# Patient Record
Sex: Male | Born: 1945
Health system: Southern US, Community
[De-identification: ages and names within clinical notes are randomized; demographics above are authoritative.]

## PROBLEM LIST (undated history)

## (undated) DIAGNOSIS — M109 Gout, unspecified: Secondary | ICD-10-CM

## (undated) DIAGNOSIS — I739 Peripheral vascular disease, unspecified: Secondary | ICD-10-CM

## (undated) DIAGNOSIS — K219 Gastro-esophageal reflux disease without esophagitis: Secondary | ICD-10-CM

## (undated) DIAGNOSIS — E785 Hyperlipidemia, unspecified: Secondary | ICD-10-CM

## (undated) DIAGNOSIS — R011 Cardiac murmur, unspecified: Secondary | ICD-10-CM

## (undated) DIAGNOSIS — G43909 Migraine, unspecified, not intractable, without status migrainosus: Secondary | ICD-10-CM

## (undated) DIAGNOSIS — R42 Dizziness and giddiness: Secondary | ICD-10-CM

## (undated) DIAGNOSIS — I2510 Atherosclerotic heart disease of native coronary artery without angina pectoris: Secondary | ICD-10-CM

## (undated) DIAGNOSIS — I10 Essential (primary) hypertension: Secondary | ICD-10-CM

## (undated) DIAGNOSIS — H409 Unspecified glaucoma: Secondary | ICD-10-CM

## (undated) DIAGNOSIS — R0602 Shortness of breath: Secondary | ICD-10-CM

## (undated) DIAGNOSIS — M1990 Unspecified osteoarthritis, unspecified site: Secondary | ICD-10-CM

## (undated) DIAGNOSIS — D509 Iron deficiency anemia, unspecified: Secondary | ICD-10-CM

## (undated) DIAGNOSIS — N400 Enlarged prostate without lower urinary tract symptoms: Secondary | ICD-10-CM

## (undated) HISTORY — PX: INCISION AND DRAINAGE: SHX5863

## (undated) HISTORY — DX: Gout, unspecified: M10.9

---

## 2000-12-10 ENCOUNTER — Encounter: Payer: Self-pay | Admitting: Family Medicine

## 2000-12-10 ENCOUNTER — Ambulatory Visit (HOSPITAL_COMMUNITY): Admission: RE | Admit: 2000-12-10 | Discharge: 2000-12-10 | Payer: Self-pay | Admitting: Family Medicine

## 2001-12-03 ENCOUNTER — Emergency Department (HOSPITAL_COMMUNITY): Admission: EM | Admit: 2001-12-03 | Discharge: 2001-12-03 | Payer: Self-pay | Admitting: Emergency Medicine

## 2001-12-03 ENCOUNTER — Encounter: Payer: Self-pay | Admitting: Emergency Medicine

## 2003-01-02 ENCOUNTER — Encounter: Admission: RE | Admit: 2003-01-02 | Discharge: 2003-01-02 | Payer: Self-pay | Admitting: Family Medicine

## 2006-05-04 ENCOUNTER — Encounter: Admission: RE | Admit: 2006-05-04 | Discharge: 2006-05-04 | Payer: Self-pay | Admitting: Sports Medicine

## 2006-05-08 ENCOUNTER — Encounter: Admission: RE | Admit: 2006-05-08 | Discharge: 2006-05-08 | Payer: Self-pay | Admitting: Sports Medicine

## 2008-11-27 ENCOUNTER — Inpatient Hospital Stay (HOSPITAL_COMMUNITY): Admission: AD | Admit: 2008-11-27 | Discharge: 2008-11-28 | Payer: Self-pay | Admitting: Interventional Cardiology

## 2008-11-27 HISTORY — PX: CORONARY ANGIOPLASTY WITH STENT PLACEMENT: SHX49

## 2009-05-05 ENCOUNTER — Emergency Department (HOSPITAL_COMMUNITY): Admission: EM | Admit: 2009-05-05 | Discharge: 2009-05-06 | Payer: Self-pay | Admitting: Emergency Medicine

## 2010-02-17 ENCOUNTER — Encounter: Payer: Self-pay | Admitting: Orthopedic Surgery

## 2010-05-01 LAB — BASIC METABOLIC PANEL
BUN: 9 mg/dL (ref 6–23)
CO2: 28 mEq/L (ref 19–32)
Calcium: 9 mg/dL (ref 8.4–10.5)
Chloride: 106 mEq/L (ref 96–112)
Creatinine, Ser: 1.44 mg/dL (ref 0.4–1.5)
GFR calc Af Amer: >60 mL/min (ref >60)
GFR calc non Af Amer: 50 mL/min — ABNORMAL LOW (ref >60)
Glucose, Bld: 106 mg/dL — ABNORMAL HIGH (ref 70–99)
Potassium: 3.8 mEq/L (ref 3.5–5.1)
Sodium: 142 mEq/L (ref 135–145)

## 2010-05-01 LAB — CBC
HCT: 37.6 % — ABNORMAL LOW (ref 39.0–52.0)
Hemoglobin: 13.1 g/dL (ref 13.0–17.0)
MCHC: 34.8 g/dL (ref 30.0–36.0)
MCV: 86.6 fL (ref 78.0–100.0)
Platelets: 192 10*3/uL (ref 150–400)
RBC: 4.34 MIL/uL (ref 4.22–5.81)
RDW: 14.9 % (ref 11.5–15.5)
WBC: 6.6 10*3/uL (ref 4.0–10.5)

## 2011-02-03 ENCOUNTER — Ambulatory Visit: Payer: Self-pay

## 2011-02-03 DIAGNOSIS — Z23 Encounter for immunization: Secondary | ICD-10-CM

## 2011-12-22 ENCOUNTER — Encounter (HOSPITAL_COMMUNITY): Payer: Self-pay | Admitting: *Deleted

## 2011-12-23 ENCOUNTER — Encounter (HOSPITAL_COMMUNITY): Payer: Self-pay | Admitting: Pharmacy Technician

## 2011-12-23 ENCOUNTER — Encounter (HOSPITAL_COMMUNITY): Payer: Self-pay | Admitting: *Deleted

## 2011-12-23 NOTE — Progress Notes (Signed)
PT'S LAST OFFICE VISIT AT [facility] WAS 12/11/11--THE NOTE IS ON PT'S CHART WITH EKG ALSO DONE 12/11/11.

## 2011-12-23 NOTE — Progress Notes (Signed)
[facility] FAXED PT'S CBC WITH DIFF REPORT-DONE 12/15/11 ALONG WITH OFFICE NOTE--PLACED ON PT'S CHART.

## 2011-12-24 ENCOUNTER — Encounter (HOSPITAL_COMMUNITY)
Admission: RE | Disposition: A | Discharge status: Home / Self Care | Payer: Self-pay | Source: Ambulatory Visit | Attending: Gastroenterology

## 2011-12-24 ENCOUNTER — Ambulatory Visit (HOSPITAL_COMMUNITY): Payer: No Typology Code available for payment source | Admitting: Anesthesiology

## 2011-12-24 ENCOUNTER — Ambulatory Visit (HOSPITAL_COMMUNITY)
Admission: RE | Admit: 2011-12-24 | Discharge: 2011-12-24 | Disposition: A | Discharge status: Home / Self Care | Payer: No Typology Code available for payment source | Source: Ambulatory Visit | Attending: Gastroenterology | Admitting: Gastroenterology

## 2011-12-24 ENCOUNTER — Encounter (HOSPITAL_COMMUNITY): Payer: Self-pay | Admitting: Anesthesiology

## 2011-12-24 ENCOUNTER — Encounter (HOSPITAL_COMMUNITY): Payer: Self-pay | Admitting: *Deleted

## 2011-12-24 ENCOUNTER — Other Ambulatory Visit: Payer: Self-pay | Admitting: Gastroenterology

## 2011-12-24 DIAGNOSIS — I2510 Atherosclerotic heart disease of native coronary artery without angina pectoris: Secondary | ICD-10-CM | POA: Insufficient documentation

## 2011-12-24 DIAGNOSIS — K219 Gastro-esophageal reflux disease without esophagitis: Secondary | ICD-10-CM | POA: Insufficient documentation

## 2011-12-24 DIAGNOSIS — I10 Essential (primary) hypertension: Secondary | ICD-10-CM | POA: Insufficient documentation

## 2011-12-24 DIAGNOSIS — K319 Disease of stomach and duodenum, unspecified: Secondary | ICD-10-CM | POA: Insufficient documentation

## 2011-12-24 DIAGNOSIS — D509 Iron deficiency anemia, unspecified: Secondary | ICD-10-CM | POA: Insufficient documentation

## 2011-12-24 HISTORY — DX: Benign prostatic hyperplasia without lower urinary tract symptoms: N40.0

## 2011-12-24 HISTORY — DX: Essential (primary) hypertension: I10

## 2011-12-24 HISTORY — DX: Peripheral vascular disease, unspecified: I73.9

## 2011-12-24 HISTORY — DX: Hyperlipidemia, unspecified: E78.5

## 2011-12-24 HISTORY — DX: Gastro-esophageal reflux disease without esophagitis: K21.9

## 2011-12-24 HISTORY — PX: EUS: SHX5427

## 2011-12-24 HISTORY — DX: Cardiac murmur, unspecified: R01.1

## 2011-12-24 HISTORY — DX: Unspecified glaucoma: H40.9

## 2011-12-24 HISTORY — DX: Atherosclerotic heart disease of native coronary artery without angina pectoris: I25.10

## 2011-12-24 HISTORY — DX: Dizziness and giddiness: R42

## 2011-12-24 SURGERY — ULTRASOUND, UPPER GI TRACT, ENDOSCOPIC
Anesthesia: General

## 2011-12-24 MED ORDER — LACTATED RINGERS IV SOLN
INTRAVENOUS | Status: DC | PRN
  Administered 2011-12-24: 10:00:00 via INTRAVENOUS

## 2011-12-24 MED ORDER — BUTAMBEN-TETRACAINE-BENZOCAINE 2-2-14 % EX AERO
INHALATION_SPRAY | CUTANEOUS | Status: DC | PRN
  Administered 2011-12-24: 2 via TOPICAL

## 2011-12-24 MED ORDER — KETAMINE HCL 10 MG/ML IJ SOLN
INTRAMUSCULAR | Status: DC | PRN
  Administered 2011-12-24 (×2): 5 mg via INTRAVENOUS

## 2011-12-24 MED ORDER — ONDANSETRON HCL 4 MG/2ML IJ SOLN
INTRAMUSCULAR | Status: DC | PRN
  Administered 2011-12-24 (×2): 2 mg via INTRAVENOUS

## 2011-12-24 MED ORDER — LACTATED RINGERS IV SOLN
INTRAVENOUS | Status: DC
  Administered 2011-12-24: 09:00:00 via INTRAVENOUS

## 2011-12-24 MED ORDER — PROPOFOL 10 MG/ML IV EMUL
INTRAVENOUS | Status: DC | PRN
  Administered 2011-12-24: 125 ug/kg/min via INTRAVENOUS

## 2011-12-24 MED ORDER — FENTANYL CITRATE 0.05 MG/ML IJ SOLN
INTRAMUSCULAR | Status: DC | PRN
  Administered 2011-12-24 (×2): 50 ug via INTRAVENOUS

## 2011-12-24 MED ORDER — MIDAZOLAM HCL 5 MG/5ML IJ SOLN
INTRAMUSCULAR | Status: DC | PRN
  Administered 2011-12-24: 1 mg via INTRAVENOUS

## 2011-12-24 MED ORDER — SODIUM CHLORIDE 0.9 % IV SOLN: INTRAVENOUS | Status: DC

## 2011-12-24 NOTE — Op Note (Signed)
[facility] [address] [place], [address]   ENDOSCOPIC ULTRASOUND PROCEDURE REPORT  PATIENT: Robert Estes, Robert Estes  MR#: [id number] BIRTHDATE: May 29, 1945  GENDER: Male ENDOSCOPIST: Willis Modena, MD REFERRED BY:  Bernette Redbird, M.D.  Billee Cashing, M.D. PROCEDURE DATE:  12/24/2011 PROCEDURE:   Upper EUS, Endoscopy with biopsies. ASA CLASS:      Class III INDICATIONS:   1.  microcytic anemia, gastric lesion. MEDICATIONS: Cetacaine spray x 2 and MAC sedation, administered by CRNA  DESCRIPTION OF PROCEDURE:   After the risks benefits and alternatives of the procedure were  explained, informed consent was obtained. The patient was then placed in the left, lateral, decubitus postion and IV sedation was administered. Throughout the procedure, the patients blood pressure, pulse and oxygen saturations were monitored continuously.  Under direct visualization, the EUS scope [id number]  endoscope was introduced through the mouth  and advanced to the bulb of duodenum .  Water was used as necessary to provide an acoustic interface.  Upon completion of the imaging, water was removed and the patient was sent to the recovery room in satisfactory condition.    FINDINGS:      Beefy red lesion, about 2 x 2 cm in size, soft but friable with some mild oozing upon contact, was visible endoscopically in the proximal body of stomach along greater curvature.  EUS then was performed.  Stomach was gently instilled with water to facilitate acoustic coupling.  The lesion was about 22 x 17mm in size on EUS, appeared to be mucosally based, and there was no clear involvement with the muscularis propria.  Wall layers of stomach appeared otherwise intact.  There was no perigastric adenopathy.  Incidental notation of gallstones.  At conclusion of our exam, after suctioning the majority of the instilled water back through the endoscope, mucosal biopsies were taken of the lesion with pediatric  cold biopsy forceps.  IMPRESSION:     As above.  Friable gatric lesion could certainly lead to microcytic anemia.  Appearance most typical of inflamed hyperplastic tissue, biopsies obtained.  RECOMMENDATIONS:     1.  Watch for potential complications of procedure. 2.  Await biopsy results. 3.  Avoid ASA/NSAIDs.  Would be reluctant to restart clopidigrel at this time as well, unless patient is at high risk for cardiac events, as this lesion will likely bleed with intensive antiplatelet therapy. 4.  Regardless of what this lesion is, it likely needs to be removed (surgically or perhaps even endoscopically).  Routine endoscopic and endoultrasonographic appearance is most suggestive of benign process, but this can't be definitively proven with biopsies alone. 5.  Will discuss with Dr. Matthias Hughs.   _______________________________ eSigned:  Willis Modena, MD 12/24/2011 10:50 AM   CC:

## 2011-12-24 NOTE — H&P (Signed)
Patient interval history reviewed.  Patient examined again.  There has been no change from documented H/P dated 12/16/11 (scanned into chart from our office) except as documented above.  Assessment:  1. Iron-deficiency anemia. 2.  Gastric mass, mucosal biopsies benign.  Plan:  1.  Endoscopy with ultrasound and possible biopsies +/- fine needle aspiration. 2.  Risks (bleeding, infection, bowel perforation that could require surgery, sedation-related changes in cardiopulmonary systems), benefits (identification and possible treatment of source of symptoms, exclusion of certain causes of symptoms), and alternatives (watchful waiting, radiographic imaging studies, empiric medical treatment) of upper endoscopy with ultrasound and biopsies +/- fine needle aspiration (EGD +/- biopsies +/- FNA) were explained to patient/son in detail and patient wishes to proceed.

## 2011-12-24 NOTE — Anesthesia Preprocedure Evaluation (Addendum)
Anesthesia Evaluation  Patient identified by MRN, date of birth, ID band Patient awake    Reviewed: Allergy & Precautions, H&P , NPO status , Patient's Chart, lab work & pertinent test results  Airway Mallampati: II TM Distance: >3 FB Neck ROM: Full    Dental  (+) Teeth Intact and Caps,    Pulmonary shortness of breath,  breath sounds clear to auscultation  Pulmonary exam normal       Cardiovascular hypertension, Pt. on medications and Pt. on home beta blockers + CAD, + Cardiac Stents and + Peripheral Vascular Disease + Valvular Problems/Murmurs AS Rhythm:Regular Rate:Normal + Systolic murmurs    Neuro/Psych negative neurological ROS  negative psych ROS   GI/Hepatic Neg liver ROS, GERD-  ,  Endo/Other  negative endocrine ROS  Renal/GU negative Renal ROS  negative genitourinary   Musculoskeletal negative musculoskeletal ROS (+)   Abdominal Normal abdominal exam  (+)   Peds  Hematology negative hematology ROS (+)   Anesthesia Other Findings   Reproductive/Obstetrics negative OB ROS                        Anesthesia Physical Anesthesia Plan  ASA: III  Anesthesia Plan: General   Post-op Pain Management:    Induction: Intravenous  Airway Management Planned: Nasal Cannula  Additional Equipment:   Intra-op Plan:   Post-operative Plan: Extubation in OR  Informed Consent:   Plan Discussed with: CRNA  Anesthesia Plan Comments:         Anesthesia Quick Evaluation

## 2011-12-24 NOTE — Anesthesia Postprocedure Evaluation (Signed)
Anesthesia Post Note  Patient: Robert Estes  Procedure(s) Performed: Procedure(s) (LRB): FULL UPPER ENDOSCOPIC ULTRASOUND (EUS) RADIAL (N/A)  Anesthesia type: General  Patient location: PACU  Post pain: Pain level controlled  Post assessment: Post-op Vital signs reviewed  Last Vitals:  Filed Vitals:   12/24/11 1049  BP: 130/70  Pulse:   Temp:   Resp: 15    Post vital signs: Reviewed  Level of consciousness: sedated  Complications: No apparent anesthesia complications

## 2011-12-24 NOTE — Transfer of Care (Signed)
Immediate Anesthesia Transfer of Care Note  Patient: Robert Estes  Procedure(s) Performed: Procedure(s) (LRB) with comments: FULL UPPER ENDOSCOPIC ULTRASOUND (EUS) RADIAL (N/A)  Patient Location: PACU  Anesthesia Type:MAC  Level of Consciousness: awake, alert , oriented and patient cooperative  Airway & Oxygen Therapy: Patient Spontanous Breathing and Patient connected to nasal cannula oxygen  Post-op Assessment: Report given to PACU RN and Post -op Vital signs reviewed and stable  Post vital signs: Reviewed and stable  Complications: No apparent anesthesia complications

## 2011-12-29 ENCOUNTER — Encounter (HOSPITAL_COMMUNITY): Payer: Self-pay | Admitting: Gastroenterology

## 2012-02-04 ENCOUNTER — Encounter (HOSPITAL_COMMUNITY): Payer: Self-pay

## 2012-02-04 ENCOUNTER — Emergency Department (HOSPITAL_COMMUNITY)
Admission: EM | Admit: 2012-02-04 | Discharge: 2012-02-05 | Disposition: A | Discharge status: Home / Self Care | Payer: No Typology Code available for payment source | Attending: Emergency Medicine | Admitting: Emergency Medicine

## 2012-02-04 DIAGNOSIS — N400 Enlarged prostate without lower urinary tract symptoms: Secondary | ICD-10-CM | POA: Insufficient documentation

## 2012-02-04 DIAGNOSIS — R011 Cardiac murmur, unspecified: Secondary | ICD-10-CM | POA: Insufficient documentation

## 2012-02-04 DIAGNOSIS — R1013 Epigastric pain: Secondary | ICD-10-CM | POA: Insufficient documentation

## 2012-02-04 DIAGNOSIS — K219 Gastro-esophageal reflux disease without esophagitis: Secondary | ICD-10-CM | POA: Insufficient documentation

## 2012-02-04 DIAGNOSIS — Z79899 Other long term (current) drug therapy: Secondary | ICD-10-CM | POA: Insufficient documentation

## 2012-02-04 DIAGNOSIS — R109 Unspecified abdominal pain: Secondary | ICD-10-CM

## 2012-02-04 DIAGNOSIS — H409 Unspecified glaucoma: Secondary | ICD-10-CM | POA: Insufficient documentation

## 2012-02-04 DIAGNOSIS — E785 Hyperlipidemia, unspecified: Secondary | ICD-10-CM | POA: Insufficient documentation

## 2012-02-04 DIAGNOSIS — Z9889 Other specified postprocedural states: Secondary | ICD-10-CM | POA: Insufficient documentation

## 2012-02-04 DIAGNOSIS — R630 Anorexia: Secondary | ICD-10-CM | POA: Insufficient documentation

## 2012-02-04 DIAGNOSIS — K2970 Gastritis, unspecified, without bleeding: Secondary | ICD-10-CM | POA: Insufficient documentation

## 2012-02-04 DIAGNOSIS — K8020 Calculus of gallbladder without cholecystitis without obstruction: Secondary | ICD-10-CM | POA: Insufficient documentation

## 2012-02-04 DIAGNOSIS — I739 Peripheral vascular disease, unspecified: Secondary | ICD-10-CM | POA: Insufficient documentation

## 2012-02-04 DIAGNOSIS — Z7982 Long term (current) use of aspirin: Secondary | ICD-10-CM | POA: Insufficient documentation

## 2012-02-04 DIAGNOSIS — D649 Anemia, unspecified: Secondary | ICD-10-CM | POA: Insufficient documentation

## 2012-02-04 DIAGNOSIS — I10 Essential (primary) hypertension: Secondary | ICD-10-CM | POA: Insufficient documentation

## 2012-02-04 NOTE — ED Notes (Signed)
Pt with severe sudden onset abdominal pain today.  Hx gastric lesions in November although pt states he did not have pain with that dx.  Has tried enema, gas x at home with no relief.  Pt states feels like his stomach is "going to bust".

## 2012-02-05 ENCOUNTER — Emergency Department (HOSPITAL_COMMUNITY): Payer: No Typology Code available for payment source

## 2012-02-05 LAB — COMPREHENSIVE METABOLIC PANEL
ALT: 19 U/L (ref 0–53)
AST: 26 U/L (ref 0–37)
Albumin: 3.7 g/dL (ref 3.5–5.2)
Alkaline Phosphatase: 73 U/L (ref 39–117)
BUN: 17 mg/dL (ref 6–23)
CO2: 23 mEq/L (ref 19–32)
Calcium: 10 mg/dL (ref 8.4–10.5)
Chloride: 102 mEq/L (ref 96–112)
Creatinine, Ser: 1.23 mg/dL (ref 0.50–1.35)
GFR calc Af Amer: 70 mL/min — ABNORMAL LOW (ref >90)
GFR calc non Af Amer: 60 mL/min — ABNORMAL LOW (ref >90)
Glucose, Bld: 97 mg/dL (ref 70–99)
Potassium: 3.5 mEq/L (ref 3.5–5.1)
Sodium: 137 mEq/L (ref 135–145)
Total Bilirubin: 0.2 mg/dL — ABNORMAL LOW (ref 0.3–1.2)
Total Protein: 6.7 g/dL (ref 6.0–8.3)

## 2012-02-05 LAB — CBC WITH DIFFERENTIAL/PLATELET
Basophils Absolute: 0 10*3/uL (ref 0.0–0.1)
Basophils Relative: 0 % (ref 0–1)
Eosinophils Absolute: 0 10*3/uL (ref 0.0–0.7)
Eosinophils Relative: 0 % (ref 0–5)
HCT: 33.4 % — ABNORMAL LOW (ref 39.0–52.0)
Hemoglobin: 10.6 g/dL — ABNORMAL LOW (ref 13.0–17.0)
Lymphocytes Relative: 44 % (ref 12–46)
Lymphs Abs: 1.9 10*3/uL (ref 0.7–4.0)
MCH: 23.2 pg — ABNORMAL LOW (ref 26.0–34.0)
MCHC: 31.7 g/dL (ref 30.0–36.0)
MCV: 73.2 fL — ABNORMAL LOW (ref 78.0–100.0)
Monocytes Absolute: 0.4 10*3/uL (ref 0.1–1.0)
Monocytes Relative: 9 % (ref 3–12)
Neutro Abs: 2 10*3/uL (ref 1.7–7.7)
Neutrophils Relative %: 47 % (ref 43–77)
Platelets: 187 10*3/uL (ref 150–400)
RBC: 4.56 MIL/uL (ref 4.22–5.81)
RDW: 18.1 % — ABNORMAL HIGH (ref 11.5–15.5)
WBC: 4.3 10*3/uL (ref 4.0–10.5)

## 2012-02-05 LAB — URINALYSIS, ROUTINE W REFLEX MICROSCOPIC
Bilirubin Urine: NEGATIVE
Glucose, UA: NEGATIVE mg/dL
Hgb urine dipstick: NEGATIVE
Ketones, ur: NEGATIVE mg/dL
Leukocytes, UA: NEGATIVE
Nitrite: NEGATIVE
Protein, ur: NEGATIVE mg/dL
Specific Gravity, Urine: 1.011 (ref 1.005–1.030)
Urobilinogen, UA: 0.2 mg/dL (ref 0.0–1.0)
pH: 8 (ref 5.0–8.0)

## 2012-02-05 LAB — TROPONIN I: Troponin I: <0.3 ng/mL (ref <0.30)

## 2012-02-05 LAB — LIPASE, BLOOD: Lipase: 42 U/L (ref 11–59)

## 2012-02-05 MED ORDER — IOHEXOL 300 MG/ML  SOLN
100.0000 mL | Freq: Once | INTRAMUSCULAR | Status: AC | PRN
  Administered 2012-02-05: 100 mL via INTRAVENOUS

## 2012-02-05 MED ORDER — RANITIDINE HCL 150 MG PO TABS: 150.0000 mg | ORAL_TABLET | Freq: Two times a day (BID) | ORAL | Status: DC

## 2012-02-05 MED ORDER — HYDROMORPHONE HCL PF 1 MG/ML IJ SOLN: 1.0000 mg | INTRAMUSCULAR | Status: DC | PRN

## 2012-02-05 MED ORDER — HYDROMORPHONE HCL PF 1 MG/ML IJ SOLN
1.0000 mg | Freq: Once | INTRAMUSCULAR | Status: AC
  Administered 2012-02-05: 1 mg via INTRAVENOUS
  Filled 2012-02-05: qty 1

## 2012-02-05 MED ORDER — SODIUM CHLORIDE 0.9 % IV SOLN
Freq: Once | INTRAVENOUS | Status: AC
  Administered 2012-02-05: via INTRAVENOUS

## 2012-02-05 MED ORDER — HYDROCODONE-ACETAMINOPHEN 5-325 MG PO TABS: 1.0000 | ORAL_TABLET | Freq: Four times a day (QID) | ORAL | Status: DC | PRN

## 2012-02-05 MED ORDER — PANTOPRAZOLE SODIUM 40 MG IV SOLR
40.0000 mg | Freq: Once | INTRAVENOUS | Status: AC
  Administered 2012-02-05: 40 mg via INTRAVENOUS
  Filled 2012-02-05: qty 40

## 2012-02-05 MED ORDER — HYDROCODONE-ACETAMINOPHEN 5-325 MG PO TABS
2.0000 | ORAL_TABLET | Freq: Once | ORAL | Status: AC
  Administered 2012-02-05: 2 via ORAL
  Filled 2012-02-05: qty 2

## 2012-02-05 MED ORDER — KETOROLAC TROMETHAMINE 30 MG/ML IJ SOLN
30.0000 mg | Freq: Once | INTRAMUSCULAR | Status: AC
  Administered 2012-02-05: 30 mg via INTRAVENOUS
  Filled 2012-02-05: qty 1

## 2012-02-05 MED ORDER — FENTANYL CITRATE 0.05 MG/ML IJ SOLN
50.0000 ug | Freq: Once | INTRAMUSCULAR | Status: AC
  Administered 2012-02-05: 50 ug via INTRAVENOUS
  Filled 2012-02-05: qty 2

## 2012-02-05 MED ORDER — SODIUM CHLORIDE 0.9 % IV BOLUS (SEPSIS): 1000.0000 mL | Freq: Once | INTRAVENOUS | Status: DC

## 2012-02-05 NOTE — ED Provider Notes (Signed)
History     CSN: [id number]  Arrival date & time 02/04/12  2338   First MD Initiated Contact with Patient 02/05/12 0008      Chief Complaint  Patient presents with  . Abdominal Pain    (Consider location/radiation/quality/duration/timing/severity/associated sxs/prior treatment) HPI Comments: Pt comes in with cc of abd pain. PMHx of CAD, Gastric mass. Pt states that his pain started about 4 pm, and is located in the epigastrium. Overtime, the pain got more severe, and he tool OTC gas medications - which haven't helped his symptoms. Around 11 pm, his pain got really bad and he decided to come to the ER. The pain is non radiating, severe in nature, and he has no hx of similar pain before. He has reduced appetite, but no nausea, emesis. Pt feels constipated, last BM was today. Pt had a recent endoscopy, and a friable gastric mass was noted. No hx of alcohol abuse, illicit substance use, GERD. No chest pain, sob.   The history is provided by the patient.    Past Medical History  Diagnosis Date  . Glaucoma     BOTH EYES  . Hypertension   . Anemia     IRON PILL EVERY OTHER DAY--CAUSE OF ANEMIA UNKOWN  . Enlarged prostate     FLOMAX HELPS PT VOID  . Coronary artery disease     HX OF HEART STENTS X 2   2010--DR. H. SMITH IS PT'S CARDIOLOGIST, MILD AORTIC STENOSIS  . Peripheral vascular disease     CLAUDICATION  . Dizziness   . Hyperlipidemia   . Shortness of breath     WITH EXERTION  . Heart murmur   . GERD (gastroesophageal reflux disease)     OTC PREVACID AS NEEDED    Past Surgical History  Procedure Date  . No previous surgery   . Cardiac catheterization     stents placed  . Left breast tumor removal   . Eus 12/24/2011    Procedure: FULL UPPER ENDOSCOPIC ULTRASOUND (EUS) RADIAL;  Surgeon: Willis Modena, MD;  Location: [facility];  Service: Endoscopy;  Laterality: N/A;    History reviewed. No pertinent family history.  History  Substance Use Topics  . Smoking  status: Never Smoker   . Smokeless tobacco: Never Used  . Alcohol Use: Yes     Comment: SOCIALLY      Review of Systems  Constitutional: Negative for fever, chills, activity change and appetite change.  HENT: Negative for neck pain.   Eyes: Negative for visual disturbance.  Respiratory: Negative for cough, chest tightness and shortness of breath.   Cardiovascular: Negative for chest pain.  Gastrointestinal: Positive for abdominal pain and constipation. Negative for nausea, vomiting, diarrhea and abdominal distention.  Genitourinary: Negative for dysuria, enuresis and difficulty urinating.  Musculoskeletal: Negative for arthralgias.  Neurological: Negative for dizziness, light-headedness and headaches.  Hematological: Does not bruise/bleed easily.  Psychiatric/Behavioral: Negative for confusion.    Allergies  Codeine  Home Medications   Current Outpatient Rx  Name  Route  Sig  Dispense  Refill  . ASPIRIN 325 MG PO TABS   Oral   Take 325 mg by mouth daily.         . OCUVITE PO TABS   Oral   Take 1 tablet by mouth daily.         Marland Kitchen COENZYME Q10 30 MG PO CAPS   Oral   Take 30 mg by mouth 3 (three) times daily.         Marland Kitchen  DESOXIMETASONE 0.25 % EX CREA   Topical   Apply topically 2 (two) times daily.         Marland Kitchen KRILL OIL 1000 MG PO CAPS   Oral   Take 1,000 mg by mouth 2 (two) times daily.         Marland Kitchen METOPROLOL SUCCINATE ER 100 MG PO TB24   Oral   Take 100 mg by mouth at bedtime. Take with or immediately following a meal.         . ADULT MULTIVITAMIN W/MINERALS CH   Oral   Take 1 tablet by mouth daily.         Marland Kitchen OMEPRAZOLE 20 MG PO CPDR   Oral   Take 20 mg by mouth daily.         . RED YEAST RICE 600 MG PO TABS   Oral   Take 1 tablet by mouth daily.         Marland Kitchen ROSUVASTATIN CALCIUM 10 MG PO TABS   Oral   Take 10 mg by mouth daily.         Marland Kitchen TAMSULOSIN HCL 0.4 MG PO CAPS   Oral   Take 0.4 mg by mouth at bedtime as needed. Bladder           . TRAVOPROST 0.004 % OP SOLN   Both Eyes   Place 1 drop into both eyes at bedtime. One drop both eyes at bedtim           BP 158/88  Pulse 87  Temp 98.1 F (36.7 C) (Oral)  Resp 20  SpO2 100%  Physical Exam  Nursing note and vitals reviewed. Constitutional: He is oriented to person, place, and time. He appears well-developed.  HENT:  Head: Normocephalic and atraumatic.  Eyes: Conjunctivae normal and EOM are normal. Pupils are equal, round, and reactive to light.  Neck: Normal range of motion. Neck supple.  Cardiovascular: Normal rate and regular rhythm.   Pulmonary/Chest: Effort normal and breath sounds normal.  Abdominal: Soft. Bowel sounds are normal. He exhibits no distension. There is tenderness. There is no rebound and no guarding.       Pt has epigastric and LUQ tenderness, no flank tenderness, no rebound or guarding.  Neurological: He is alert and oriented to person, place, and time.  Skin: Skin is warm.    ED Course  Procedures (including critical care time)  Labs Reviewed  CBC WITH DIFFERENTIAL - Abnormal; Notable for the following:    Hemoglobin 10.6 (*)     HCT 33.4 (*)     MCV 73.2 (*)     MCH 23.2 (*)     RDW 18.1 (*)     All other components within normal limits  COMPREHENSIVE METABOLIC PANEL - Abnormal; Notable for the following:    Total Bilirubin 0.2 (*)     GFR calc non Af Amer 60 (*)     GFR calc Af Amer 70 (*)     All other components within normal limits  LIPASE, BLOOD  TROPONIN I  URINALYSIS, ROUTINE W REFLEX MICROSCOPIC   Dg Abd Acute W/chest  02/05/2012  *RADIOLOGY REPORT*  Clinical Data: Right-sided pain for 24 hours.  ACUTE ABDOMEN SERIES (ABDOMEN 2 VIEW & CHEST 1 VIEW)  Comparison: 05/05/2009.  Findings: Normal cardiac and mediastinal silhouette.  Clear lung fields.  No bony abnormality.  No free air. Old left-sided rib fractures.  Scattered air-fluid levels in nondistended small bowel loops could represent mild ileus.  Moderate  stool  burden without obstruction.  Rounded densities left mid abdomen were present on previous radiographs from 2011, and could represent renal calculi. Unremarkable osseous structures.  IMPRESSION: No active cardiopulmonary disease.  Possible mild ileus.  Cannot exclude left renal calculi.   Original Report Authenticated By: Davonna Belling, M.D.      No diagnosis found.    MDM   Date: 02/05/2012  Rate: 91  Rhythm: normal sinus rhythm  QRS Axis: normal  Intervals: normal  ST/T Wave abnormalities: normal  Conduction Disutrbances: none  Narrative Interpretation: unremarkable    DDx includes: Pancreatitis Hepatobiliary pathology including cholecystitis Gastritis/PUD SBO ACS syndrome Aortic Dissection Colitis AAA Tumors Colitis Intra abdominal abscess Thrombosis Mesenteric ischemia Diverticulitis Peritonitis Appendicitis Hernia Nephrolithiasis Pyelonephritis UTI/Cystitis   Pt comes in with cc of severe abdominal pain. He has no peritoneal findings, but does appear to be in distress due to the pain. He has hx of friable gastric mass. Denjies any hematemesis, melena. Will start with AAS, and if negative get CT abd.  Late Entry: AAS negative - possible left sided stone. CT abdomen done - has Gall stones, but no other acute findings. Pt's pain improved with analgesics, and after 2nd dose of dilaudid, he hasn't reqd any further meds, tolerating po well.  I discussed the findings with the patient. He does have gall stones, and so he will get Surgical f/u - although i tild him that the current sx are likely not from gall stones. No renal stones seen, and all the labs are negative. Pt is feeling better, and we will d/c him now.           Derwood Kaplan, MD 02/05/12 [phone]

## 2012-02-05 NOTE — ED Notes (Signed)
Pt's son,  Keldan---- tel# [phone]

## 2012-03-23 ENCOUNTER — Telehealth (INDEPENDENT_AMBULATORY_CARE_PROVIDER_SITE_OTHER): Payer: Self-pay | Admitting: *Deleted

## 2012-03-23 NOTE — Telephone Encounter (Signed)
Patient called to ask for advise to help with pain relief of hems prior to appt tomorrow.  Instructed patient to use sitz baths, ice, and try OTC Tuck's pads to help with pain management.  Patient states understanding and agreeable at this time.

## 2012-03-25 ENCOUNTER — Encounter (INDEPENDENT_AMBULATORY_CARE_PROVIDER_SITE_OTHER): Payer: Self-pay | Admitting: General Surgery

## 2012-03-25 ENCOUNTER — Ambulatory Visit (INDEPENDENT_AMBULATORY_CARE_PROVIDER_SITE_OTHER): Payer: Self-pay | Admitting: General Surgery

## 2012-03-25 ENCOUNTER — Telehealth (INDEPENDENT_AMBULATORY_CARE_PROVIDER_SITE_OTHER): Payer: Self-pay | Admitting: *Deleted

## 2012-03-25 VITALS — BP 108/60 | HR 60 | Resp 14 | Ht 67.0 in | Wt 170.0 lb

## 2012-03-25 DIAGNOSIS — K645 Perianal venous thrombosis: Secondary | ICD-10-CM | POA: Insufficient documentation

## 2012-03-25 MED ORDER — LIDOCAINE-HYDROCORTISONE ACE 3-2.5 % RE KIT: 1.0000 "application " | PACK | Freq: Two times a day (BID) | RECTAL | Status: DC

## 2012-03-25 NOTE — Patient Instructions (Signed)
You have a resolving thrombosed external hemorrhoid Pick up prescription at pharmacy  Hemorrhoids Hemorrhoids are enlarged (dilated) veins around the rectum. There are 2 types of hemorrhoids, and the type of hemorrhoid is determined by its location. Internal hemorrhoids occur in the veins just inside the rectum.They are usually not painful, but they may bleed.However, they may poke through to the outside and become irritated and painful. External hemorrhoids involve the veins outside the anus and can be felt as a painful swelling or hard lump near the anus.They are often itchy and may crack and bleed. Sometimes clots will form in the veins. This makes them swollen and painful. These are called thrombosed hemorrhoids. CAUSES Causes of hemorrhoids include:  Pregnancy. This increases the pressure in the hemorrhoidal veins.  Constipation.  Straining to have a bowel movement.  Obesity.  Heavy lifting or other activity that caused you to strain. TREATMENT Most of the time hemorrhoids improve in 1 to 2 weeks. However, if symptoms do not seem to be getting better or if you have a lot of rectal bleeding, your caregiver may perform a procedure to help make the hemorrhoids get smaller or remove them completely.Possible treatments include:  Rubber band ligation. A rubber band is placed at the base of the hemorrhoid to cut off the circulation.  Sclerotherapy. A chemical is injected to shrink the hemorrhoid.  Infrared light therapy. Tools are used to burn the hemorrhoid.  Hemorrhoidectomy. This is surgical removal of the hemorrhoid. HOME CARE INSTRUCTIONS   Increase fiber in your diet. Ask your caregiver about using fiber supplements.  Drink enough water and fluids to keep your urine clear or pale yellow.  Exercise regularly.  Go to the bathroom when you have the urge to have a bowel movement. Do not wait.  Avoid straining to have bowel movements.  Keep the anal area dry and  clean.  Only take over-the-counter or prescription medicines for pain, discomfort, or fever as directed by your caregiver. If your hemorrhoids are thrombosed:  Take warm sitz baths for 20 to 30 minutes, 3 to 4 times per day.  If the hemorrhoids are very tender and swollen, place ice packs on the area as tolerated. Using ice packs between sitz baths may be helpful. Fill a plastic bag with ice. Place a towel between the bag of ice and your skin.  Medicated creams and suppositories may be used or applied as directed.  Do not use a donut-shaped pillow or sit on the toilet for long periods. This increases blood pooling and pain. SEEK MEDICAL CARE IF:   You have increasing pain and swelling that is not controlled with your medicine.  You have uncontrolled bleeding.  You have difficulty or you are unable to have a bowel movement.  You have pain or inflammation outside the area of the hemorrhoids.  You have chills or an oral temperature above 102 F (38.9 C). MAKE SURE YOU:   Understand these instructions.  Will watch your condition.  Will get help right away if you are not doing well or get worse. Document Released: 01/11/2000 Document Revised: 04/07/2011 Document Reviewed: 12/24/2009 [facility] Patient Information 2013 [place], [place].  GETTING TO GOOD BOWEL HEALTH. Irregular bowel habits such as constipation and diarrhea can lead to many problems over time.  Having one soft bowel movement a day is the most important way to prevent further problems.  The anorectal canal is designed to handle stretching and feces to safely manage our ability to get rid of solid waste (  feces, poop, stool) out of our body.  BUT, hard constipated stools can act like ripping concrete bricks and diarrhea can be a burning fire to this very sensitive area of our body, causing inflamed hemorrhoids, anal fissures, increasing risk is perirectal abscesses, abdominal pain/bloating, an making irritable bowel worse.      The goal: ONE SOFT BOWEL MOVEMENT A DAY!  To have soft, regular bowel movements:    Drink at least 8 tall glasses of water a day.     Take plenty of fiber.  Fiber is the undigested part of plant food that passes into the colon, acting s "natures broom" to encourage bowel motility and movement.  Fiber can absorb and hold large amounts of water. This results in a larger, bulkier stool, which is soft and easier to pass. Work gradually over several weeks up to 6 servings a day of fiber (25g a day even more if needed) in the form of: o Vegetables -- Root (potatoes, carrots, turnips), leafy green (lettuce, salad greens, celery, spinach), or cooked high residue (cabbage, broccoli, etc) o Fruit -- Fresh (unpeeled skin & pulp), Dried (prunes, apricots, cherries, etc ),  or stewed ( applesauce)  o Whole grain breads, pasta, etc (whole wheat)  o Bran cereals    Bulking Agents -- This type of water-retaining fiber generally is easily obtained each day by one of the following:  o Psyllium bran -- The psyllium plant is remarkable because its ground seeds can retain so much water. This product is available as Metamucil, Konsyl, Effersyllium, Per Diem Fiber, or the less expensive generic preparation in drug and health food stores. Although labeled a laxative, it really is not a laxative.  o Methylcellulose -- This is another fiber derived from wood which also retains water. It is available as Citrucel. o Polyethylene Glycol - and "artificial" fiber commonly called Miralax or Glycolax.  It is helpful for people with gassy or bloated feelings with regular fiber o Flax Seed - a less gassy fiber than psyllium   No reading or other relaxing activity while on the toilet. If bowel movements take longer than 5 minutes, you are too constipated   AVOID CONSTIPATION.  High fiber and water intake usually takes care of this.  Sometimes a laxative is needed to stimulate more frequent bowel movements, but    Laxatives are not a  good long-term solution as it can wear the colon out. o Osmotics (Milk of Magnesia, Fleets phosphosoda, Magnesium citrate, MiraLax, GoLytely) are safer than  o Stimulants (Senokot, Castor Oil, Dulcolax, Ex Lax)    o Do not take laxatives for more than 7days in a row.    IF SEVERELY CONSTIPATED, try a Bowel Retraining Program: o Do not use laxatives.  o Eat a diet high in roughage, such as bran cereals and leafy vegetables.  o Drink six (6) ounces of prune or apricot juice each morning.  o Eat two (2) large servings of stewed fruit each day.  o Take one (1) heaping tablespoon of a psyllium-based bulking agent twice a day. Use sugar-free sweetener when possible to avoid excessive calories.  o Eat a normal breakfast.  o Set aside 15 minutes after breakfast to sit on the toilet, but do not strain to have a bowel movement.  o If you do not have a bowel movement by the third day, use an enema and repeat the above steps.  o

## 2012-03-25 NOTE — Progress Notes (Signed)
Patient ID: Robert Estes, male   DOB: 1945/08/23, 67 y.o.   MRN: [id number]  Chief Complaint  Patient presents with  . Hemorrhoids    HPI Robert Estes is a 67 y.o. male.  HPI 67 year old [place] American male who referred himself for evaluation of hemorrhoids. The patient states that his last problem with hemorrhoids about 20 years ago. However he developed a hemorrhoidal problem over the weekend after sneezing. He developed some painful swelling. Since the weekend the swelling has gone down significantly and the pain has dramatically eased up. He states that he normally has a bowel movement every day. He denies any straining. He denies being a bathroom reader. He drinks about 2 bottles of water a day. Normally he does not have any pain with defecation. He denies any anal or rectal bleeding. He states that he did have a colonoscopy 2-3 years ago which was normal. He recently underwent an upper endoscopy for removal of a gastric polyp. He states this was done at [facility]. He states that it was benign. He takes a stool softener a daily basis. Since he had the swelling, he has been using ice packs, sitz baths, and a homemade concoction similar to Vick's vapor rub.    Past Medical History  Diagnosis Date  . Glaucoma     BOTH EYES  . Hypertension   . Anemia     IRON PILL EVERY OTHER DAY--CAUSE OF ANEMIA UNKOWN  . Enlarged prostate     FLOMAX HELPS PT VOID  . Coronary artery disease     HX OF HEART STENTS X 2   2010--DR. H. SMITH IS PT'S CARDIOLOGIST, MILD AORTIC STENOSIS  . Peripheral vascular disease     CLAUDICATION  . Dizziness   . Hyperlipidemia   . Shortness of breath     WITH EXERTION  . Heart murmur   . GERD (gastroesophageal reflux disease)     OTC PREVACID AS NEEDED  . Hypertension   . Gout     Past Surgical History  Procedure Laterality Date  . Cardiac catheterization  11/27/2008    stents placed  . Eus  12/24/2011    Procedure: FULL UPPER ENDOSCOPIC ULTRASOUND  (EUS) RADIAL;  Surgeon: Willis Modena, MD;  Location: [facility];  Service: Endoscopy;  Laterality: N/A;    Family History  Problem Relation Age of Onset  . Breast cancer Mother     Social History History  Substance Use Topics  . Smoking status: Never Smoker   . Smokeless tobacco: Never Used  . Alcohol Use: Yes     Comment: SOCIALLY    Allergies  Allergen Reactions  . Codeine Itching    Current Outpatient Prescriptions  Medication Sig Dispense Refill  . desoximetasone (TOPICORT) 0.25 % cream Apply topically 2 (two) times daily.      Marland Kitchen KRILL OIL 1000 MG CAPS Take 1,000 mg by mouth 2 (two) times daily.      . metoprolol succinate (TOPROL-XL) 100 MG 24 hr tablet Take 100 mg by mouth at bedtime. Take with or immediately following a meal.      . Multiple Vitamin (MULTIVITAMIN WITH MINERALS) TABS Take 1 tablet by mouth daily.      Marland Kitchen omeprazole (PRILOSEC) 40 MG capsule Take 40 mg by mouth daily.      . rosuvastatin (CRESTOR) 10 MG tablet Take 10 mg by mouth daily.      . Tamsulosin HCl (FLOMAX) 0.4 MG CAPS Take 0.4 mg by mouth at bedtime as  needed. Bladder      . traMADol (ULTRAM) 50 MG tablet Take 50 mg by mouth every 6 (six) hours as needed for pain.      Marland Kitchen travoprost, benzalkonium, (TRAVATAN) 0.004 % ophthalmic solution Place 1 drop into both eyes at bedtime. One drop both eyes at bedtim      . Lidocaine-Hydrocortisone Ace 3-2.5 % KIT Place 1 application rectally 2 (two) times daily.  1 each  1   No current facility-administered medications for this visit.    Review of Systems Review of Systems  Constitutional: Negative for fever, chills, appetite change and unexpected weight change.  HENT: Negative for congestion and trouble swallowing.   Eyes: Negative for visual disturbance.  Respiratory: Negative for chest tightness and shortness of breath.   Cardiovascular: Negative for chest pain and leg swelling.       No PND, no orthopnea, no DOE  Gastrointestinal:       See HPI    Genitourinary: Positive for difficulty urinating. Negative for dysuria and hematuria.  Musculoskeletal: Negative.   Skin: Negative for rash.  Neurological: Negative for seizures and speech difficulty.  Hematological: Does not bruise/bleed easily.  Psychiatric/Behavioral: Negative for behavioral problems and confusion.    Blood pressure 108/60, pulse 60, resp. rate 14, height 5\' 7"  (1.702 m), weight 170 lb (77.111 kg).  Physical Exam Physical Exam  Vitals reviewed. Constitutional: He is oriented to person, place, and time. He appears well-developed and well-nourished. No distress.  HENT:  Head: Normocephalic and atraumatic.  Eyes: Conjunctivae are normal. No scleral icterus.  Neck: Normal range of motion. Neck supple. No tracheal deviation present. No thyromegaly present.  Cardiovascular: Normal rate and regular rhythm.   Murmur heard. Pulmonary/Chest: Effort normal. No respiratory distress. He has no wheezes.  Abdominal: Soft. He exhibits no distension. There is no tenderness. There is no rebound and no guarding.  Tiny umbilical hernia - reducible  Genitourinary: Rectal exam shows external hemorrhoid. Rectal exam shows no fissure and anal tone normal.     Left thrombosed ext hemorrhoid; good tone. No fissure. Small grade 1 internal hemorrhoid on RT.   Musculoskeletal: He exhibits no edema and no tenderness.  Lymphadenopathy:    He has no cervical adenopathy.  Neurological: He is alert and oriented to person, place, and time.  Skin: Skin is warm and dry. No rash noted. He is not diaphoretic. No erythema. No pallor.  Psychiatric: He has a normal mood and affect. His behavior is normal. Judgment and thought content normal.    Data Reviewed EUS 11/2011 and path report  Assessment    Left Thrombosed external hemorrhoid Small grade 1 internal hemorrhoid     Plan    It appears he developed a thrombosed external hemorrhoid over the weekend. It appears to be resolving on its  own. Therefore I recommended continued observation and medical management of it. I did not recommend incision and evacuation of the thrombosed hemorrhoid since it is getting better.  We discussed hemorrhoids. The patient was given Agricultural engineer. We discussed strategies for management of this improving thrombosed hemorrhoid such as sitz baths, stool softeners, high-fiber diet, and drinking plenty of water. I gave him a prescription for AnaMantle.  We discussed the possibility of elective excision of his external hemorrhoid. We discussed the risk and benefits of surgery. I recommended doing this when his acute thrombosis has completely resolved if he was interested in doing this. We discussed the importance of drinking 6-8 glasses of water a day. We  also discussed the importance of eating a high fiber diet. We discussed fiber supplement such as Benefiber or Metamucil. He was encouraged to gradually increase the fiber in his diet over several weeks order to minimize bloating and crampy abdominal pain. I encouraged him to contact our office should he like to pursue excision of his external hemorrhoid in the future. Otherwise followup as needed.  Mary Sella. Andrey Campanile, MD, FACS General, Bariatric, & Minimally Invasive Surgery [facility], [place]        [facility] M 03/25/2012, 2:09 PM

## 2012-03-25 NOTE — Telephone Encounter (Signed)
Pharmacy called to state prescription for Lidocaine-Hydrocortisone 3-2.5% was over $300 and patient states he is unable to pay for this.  Prescription changed to Lidocaine-Hydrocortisone 3-0.5% which is $58.

## 2012-04-09 ENCOUNTER — Encounter (INDEPENDENT_AMBULATORY_CARE_PROVIDER_SITE_OTHER): Payer: Self-pay | Admitting: General Surgery

## 2012-04-09 ENCOUNTER — Telehealth (INDEPENDENT_AMBULATORY_CARE_PROVIDER_SITE_OTHER): Payer: Self-pay | Admitting: General Surgery

## 2012-04-09 ENCOUNTER — Ambulatory Visit (INDEPENDENT_AMBULATORY_CARE_PROVIDER_SITE_OTHER): Payer: Self-pay | Admitting: General Surgery

## 2012-04-09 VITALS — BP 120/70 | HR 87 | Temp 97.2°F | Ht 68.0 in | Wt 175.2 lb

## 2012-04-09 DIAGNOSIS — K645 Perianal venous thrombosis: Secondary | ICD-10-CM

## 2012-04-09 NOTE — Progress Notes (Signed)
Patient ID: Robert Estes, male   DOB: Jul 18, 1945, 67 y.o.   MRN: [id number] History: This patient saw Dr. Gaynelle Adu October 27 with a thrombosed hemorrhoid. It was felt that this was resolving spontaneously and he was treated medically. He states that the lump has not gone away and  he has had pain on daily basis. No bleeding. No abdominal symptoms. He has coronary artery disease and has stents in place but is not on any Plavix since he was diagnosed with a polyp and ulcer in his stomach recently. This has been removed. He also has gallstones.  Exam: Patient is alert. Friendly. Cooperative. Minimal distress. Rectal reveals chronic thrombosed hemorrhoid right side. A little bit tender. Not infected. Treatment included alcohol prep, 1% Xylocaine with epinephrine local, conservative excision with scissors removing a large chronic thrombus. Following this all thrombosis seemed to be excised. Tolerated well. Hemostasis good. Dry bandage placed  Assessment: Thrombosed external hemorrhoid, right lateral. Failure to resolve with medical management  Plan: Thrombosed hemorrhoid, right lateral, excised today. Tolerated well. Wound care and bowel regimen discussed and printed in detail. Return if this does not completely heal in 4 weeks. Otherwise see [place] when necessary.    Angelia Mould. Derrell Lolling, M.D., [facility] General and Minimally invasive Surgery Breast and Colorectal Surgery Office:   [phone] Pager:   [phone]

## 2012-04-09 NOTE — Telephone Encounter (Signed)
Pt called with extreme pain from thrombosed hem.  He was seen earlier this week, but was treating with sitz baths.  Pt reports it is much more swollen and the pain is worse now; he cannot sit or walk without pain.  Will add to urgent office for evaluation.

## 2012-04-09 NOTE — Patient Instructions (Signed)
We removed the thrombosed hemorrhoid today. This leaves a small open wound that will take about 2 weeks to heal.  You will have a little bit of drainage and you'll need to wear a pad.  Starting tomorrow, take a warm tub bath 3 times a day.  Do whatever necessary to avoid constipation.  Use baby wipes instead of dry toilet paper for 3 weeks.  The pain should immediately get better.  Return to see Dr. Derrell Lolling if this does not completely heal in 3-4 weeks.

## 2012-10-21 ENCOUNTER — Encounter: Payer: Self-pay | Admitting: Interventional Cardiology

## 2012-10-25 ENCOUNTER — Other Ambulatory Visit: Payer: Self-pay | Admitting: Interventional Cardiology

## 2012-10-25 DIAGNOSIS — Z79899 Other long term (current) drug therapy: Secondary | ICD-10-CM

## 2012-10-25 DIAGNOSIS — E785 Hyperlipidemia, unspecified: Secondary | ICD-10-CM

## 2012-10-27 ENCOUNTER — Telehealth: Payer: Self-pay

## 2012-10-27 MED ORDER — PRAVASTATIN SODIUM 40 MG PO TABS: ORAL_TABLET | ORAL | Status: DC

## 2012-10-27 NOTE — Telephone Encounter (Signed)
Lab result from [facility] cardiology given to pt. Dr.Smith instructs pt to watch diet and exercise. No medication change.pt rqst refill for pravastatin.pt verbalized understanding

## 2012-10-29 ENCOUNTER — Other Ambulatory Visit: Payer: Self-pay

## 2012-10-29 ENCOUNTER — Other Ambulatory Visit: Payer: Self-pay | Admitting: Interventional Cardiology

## 2012-10-29 DIAGNOSIS — E785 Hyperlipidemia, unspecified: Secondary | ICD-10-CM

## 2012-10-29 MED ORDER — PRAVASTATIN SODIUM 40 MG PO TABS: ORAL_TABLET | ORAL | Status: DC

## 2012-12-16 ENCOUNTER — Emergency Department (HOSPITAL_COMMUNITY)
Admission: EM | Admit: 2012-12-16 | Discharge: 2012-12-16 | Disposition: A | Discharge status: Home / Self Care | Payer: Medicare Other | Attending: Emergency Medicine | Admitting: Emergency Medicine

## 2012-12-16 ENCOUNTER — Encounter (HOSPITAL_COMMUNITY): Payer: Self-pay | Admitting: Emergency Medicine

## 2012-12-16 DIAGNOSIS — H409 Unspecified glaucoma: Secondary | ICD-10-CM | POA: Insufficient documentation

## 2012-12-16 DIAGNOSIS — N400 Enlarged prostate without lower urinary tract symptoms: Secondary | ICD-10-CM | POA: Insufficient documentation

## 2012-12-16 DIAGNOSIS — K8020 Calculus of gallbladder without cholecystitis without obstruction: Secondary | ICD-10-CM | POA: Insufficient documentation

## 2012-12-16 DIAGNOSIS — Z79899 Other long term (current) drug therapy: Secondary | ICD-10-CM | POA: Insufficient documentation

## 2012-12-16 DIAGNOSIS — Z9861 Coronary angioplasty status: Secondary | ICD-10-CM | POA: Insufficient documentation

## 2012-12-16 DIAGNOSIS — I2510 Atherosclerotic heart disease of native coronary artery without angina pectoris: Secondary | ICD-10-CM | POA: Insufficient documentation

## 2012-12-16 DIAGNOSIS — E785 Hyperlipidemia, unspecified: Secondary | ICD-10-CM | POA: Insufficient documentation

## 2012-12-16 DIAGNOSIS — K8050 Calculus of bile duct without cholangitis or cholecystitis without obstruction: Secondary | ICD-10-CM

## 2012-12-16 DIAGNOSIS — Z95818 Presence of other cardiac implants and grafts: Secondary | ICD-10-CM | POA: Insufficient documentation

## 2012-12-16 DIAGNOSIS — I10 Essential (primary) hypertension: Secondary | ICD-10-CM | POA: Insufficient documentation

## 2012-12-16 DIAGNOSIS — M549 Dorsalgia, unspecified: Secondary | ICD-10-CM | POA: Insufficient documentation

## 2012-12-16 DIAGNOSIS — D649 Anemia, unspecified: Secondary | ICD-10-CM | POA: Insufficient documentation

## 2012-12-16 DIAGNOSIS — R1031 Right lower quadrant pain: Secondary | ICD-10-CM | POA: Insufficient documentation

## 2012-12-16 DIAGNOSIS — R011 Cardiac murmur, unspecified: Secondary | ICD-10-CM | POA: Insufficient documentation

## 2012-12-16 DIAGNOSIS — R1011 Right upper quadrant pain: Secondary | ICD-10-CM

## 2012-12-16 DIAGNOSIS — K219 Gastro-esophageal reflux disease without esophagitis: Secondary | ICD-10-CM | POA: Insufficient documentation

## 2012-12-16 LAB — URINALYSIS, ROUTINE W REFLEX MICROSCOPIC
Bilirubin Urine: NEGATIVE
Glucose, UA: NEGATIVE mg/dL
Hgb urine dipstick: NEGATIVE
Ketones, ur: NEGATIVE mg/dL
Leukocytes, UA: NEGATIVE
Nitrite: NEGATIVE
Protein, ur: NEGATIVE mg/dL
Specific Gravity, Urine: 1.011 (ref 1.005–1.030)
Urobilinogen, UA: 0.2 mg/dL (ref 0.0–1.0)
pH: 8 (ref 5.0–8.0)

## 2012-12-16 LAB — CBC WITH DIFFERENTIAL/PLATELET
Basophils Absolute: 0 10*3/uL (ref 0.0–0.1)
Basophils Relative: 0 % (ref 0–1)
Eosinophils Absolute: 0 10*3/uL (ref 0.0–0.7)
Eosinophils Relative: 0 % (ref 0–5)
HCT: 36.5 % — ABNORMAL LOW (ref 39.0–52.0)
Hemoglobin: 12.2 g/dL — ABNORMAL LOW (ref 13.0–17.0)
Lymphocytes Relative: 49 % — ABNORMAL HIGH (ref 12–46)
Lymphs Abs: 1.6 10*3/uL (ref 0.7–4.0)
MCH: 25.4 pg — ABNORMAL LOW (ref 26.0–34.0)
MCHC: 33.4 g/dL (ref 30.0–36.0)
MCV: 76 fL — ABNORMAL LOW (ref 78.0–100.0)
Monocytes Absolute: 0.5 10*3/uL (ref 0.1–1.0)
Monocytes Relative: 14 % — ABNORMAL HIGH (ref 3–12)
Neutro Abs: 1.3 10*3/uL — ABNORMAL LOW (ref 1.7–7.7)
Neutrophils Relative %: 38 % — ABNORMAL LOW (ref 43–77)
Platelets: 185 10*3/uL (ref 150–400)
RBC: 4.8 MIL/uL (ref 4.22–5.81)
RDW: 16.4 % — ABNORMAL HIGH (ref 11.5–15.5)
WBC: 3.4 10*3/uL — ABNORMAL LOW (ref 4.0–10.5)

## 2012-12-16 LAB — COMPREHENSIVE METABOLIC PANEL
ALT: 27 U/L (ref 0–53)
AST: 29 U/L (ref 0–37)
Albumin: 3.9 g/dL (ref 3.5–5.2)
Alkaline Phosphatase: 86 U/L (ref 39–117)
BUN: 12 mg/dL (ref 6–23)
CO2: 24 mEq/L (ref 19–32)
Calcium: 9.7 mg/dL (ref 8.4–10.5)
Chloride: 102 mEq/L (ref 96–112)
Creatinine, Ser: 1.27 mg/dL (ref 0.50–1.35)
GFR calc Af Amer: 67 mL/min — ABNORMAL LOW (ref >90)
GFR calc non Af Amer: 58 mL/min — ABNORMAL LOW (ref >90)
Glucose, Bld: 81 mg/dL (ref 70–99)
Potassium: 4.2 mEq/L (ref 3.5–5.1)
Sodium: 138 mEq/L (ref 135–145)
Total Bilirubin: 0.3 mg/dL (ref 0.3–1.2)
Total Protein: 7.1 g/dL (ref 6.0–8.3)

## 2012-12-16 LAB — TROPONIN I: Troponin I: <0.3 ng/mL (ref <0.30)

## 2012-12-16 LAB — LACTIC ACID, PLASMA: Lactic Acid, Venous: 1.9 mmol/L (ref 0.5–2.2)

## 2012-12-16 LAB — LIPASE, BLOOD: Lipase: 32 U/L (ref 11–59)

## 2012-12-16 MED ORDER — ONDANSETRON HCL 4 MG/2ML IJ SOLN: 4.0000 mg | Freq: Once | INTRAMUSCULAR | Status: DC

## 2012-12-16 MED ORDER — ONDANSETRON 4 MG PO TBDP: ORAL_TABLET | ORAL | Status: DC

## 2012-12-16 MED ORDER — HYDROCODONE-ACETAMINOPHEN 5-325 MG PO TABS: 2.0000 | ORAL_TABLET | ORAL | Status: DC | PRN

## 2012-12-16 MED ORDER — SODIUM CHLORIDE 0.9 % IV BOLUS (SEPSIS)
1000.0000 mL | Freq: Once | INTRAVENOUS | Status: AC
  Administered 2012-12-16: 1000 mL via INTRAVENOUS

## 2012-12-16 MED ORDER — HYDROMORPHONE HCL PF 1 MG/ML IJ SOLN
1.0000 mg | Freq: Once | INTRAMUSCULAR | Status: AC
  Administered 2012-12-16: 1 mg via INTRAVENOUS
  Filled 2012-12-16: qty 1

## 2012-12-16 NOTE — ED Notes (Signed)
Pt request to speak with EDP; EDP aware.

## 2012-12-16 NOTE — ED Notes (Signed)
RN Lequita Halt and MD Jodi Mourning notified that patient is now complaining of chest pain

## 2012-12-16 NOTE — Consult Note (Signed)
Reason for Consult: symptomatic gallstones Referring Physician: Samuella Cota, MD Cardiologist:  Dr. Verdis Prime   Robert Estes is an 67 y.o. male.  HPI: 67 y/o male who presents with an upset stomach starting Monday evening 12/13/12 after eating chitlins.  He has had Pain, some nausea that comes and goes since Monday evening. Pain is in the RUQ, and currently he is pain free.    He says he's been able to eat some full liquid type stuff, but says it may make things worse. He presented to the ER today with pain and nausea, no vomiting, currently complaining of sore/dry throat, and says he's dehydrated. He urgently wants something to drink.  He had a similar episode back in January 2014, and CT scan showed Gallstones and sludge.  He was instructed to follow up with general surgery, but has not had insurance and has not done so in the past.   He has been asymptomatic from January till last Monday 12/13/12.  His labs are all normal. WBC is 3.4, LFT's, and lipase are normal. Troponin is also normal. Dr. Gardiner Rhyme did a bedside Ultrasound and that will be in his note, but he reports it is unchanged from CT report in January 2014.  He was going to be discharged home with follow up after this visit by Dr. Jodi Mourning, then he complained of ongoing pain so we were ask to see.  He is now pain free again and after long discussion, history and PE he would like to go home and come back to have gallbladder addressed next week.  He is aware that he will need medical clearance prior to surgery, because of his heart and PVD.  Past Medical History  Diagnosis Date  . Coronary artery disease     HX OF HEART STENTS X 2 (Taxus)    2010--DR. H. SMITH IS PT'S CARDIOLOGIST, MILD AORTIC STENOSIS.  He saw Dr. Katrinka Blazing in Sept, but I do not see a current note.  He is off Plavix and just on ASA 81 mg. Circumflex and distal RCA stents.  . Peripheral vascular disease      CLAUDICATION      Cardiac Mumur     .  Hyperlipidemia     . Hypertension     BPH   Anemia    IRON PILL EVERY OTHER DAY--CAUSE OF ANEMIA UNKOWN     Glaucoma    BOTH EYES  GERD (gastroesophageal reflux disease)    OTC PREVACID AS NEEDED     Gout    He had some type of GI polyp removal at [facility] this year, he's not sure when. It was not malignant.       Past Surgical History  Procedure Laterality Date  . Cardiac catheterization  11/27/2008    stents placed  . Eus  12/24/2011    Procedure: FULL UPPER ENDOSCOPIC ULTRASOUND (EUS) RADIAL;  Surgeon: Willis Modena, MD;  Location: [facility];  Service: Endoscopy;  Laterality: N/A;    Family History  Problem Relation Age of Onset  . Breast cancer Mother     Social History:  reports that he has never smoked. He has never used smokeless tobacco. He reports that he does not drink alcohol or use illicit drugs.  Allergies:  Allergies  Allergen Reactions  . Codeine Itching    Medications:  Prior to Admission:  (Not in a hospital admission) Scheduled: Continuous: PRN:   Anti-infectives   None      Results for orders placed  during the hospital encounter of 12/16/12 (from the past 48 hour(s))  LACTIC ACID, PLASMA     Status: None   Collection Time    12/16/12  9:50 AM      Result Value Range   Lactic Acid, Venous 1.9  0.5 - 2.2 mmol/L  LIPASE, BLOOD     Status: None   Collection Time    12/16/12  9:50 AM      Result Value Range   Lipase 32  11 - 59 U/L  COMPREHENSIVE METABOLIC PANEL     Status: Abnormal   Collection Time    12/16/12  9:50 AM      Result Value Range   Sodium 138  135 - 145 mEq/L   Potassium 4.2  3.5 - 5.1 mEq/L   Chloride 102  96 - 112 mEq/L   CO2 24  19 - 32 mEq/L   Glucose, Bld 81  70 - 99 mg/dL   BUN 12  6 - 23 mg/dL   Creatinine, Ser 6.57  0.50 - 1.35 mg/dL   Calcium 9.7  8.4 - 84.6 mg/dL   Total Protein 7.1  6.0 - 8.3 g/dL   Albumin 3.9  3.5 - 5.2 g/dL   AST 29  0 - 37 U/L   ALT 27  0 - 53 U/L   Alkaline Phosphatase 86  39 -  117 U/L   Total Bilirubin 0.3  0.3 - 1.2 mg/dL   GFR calc non Af Amer 58 (*) >90 mL/min   GFR calc Af Amer 67 (*) >90 mL/min   Comment: (NOTE)     The eGFR has been calculated using the CKD EPI equation.     This calculation has not been validated in all clinical situations.     eGFR's persistently <90 mL/min signify possible Chronic Kidney     Disease.  CBC WITH DIFFERENTIAL     Status: Abnormal   Collection Time    12/16/12  9:50 AM      Result Value Range   WBC 3.4 (*) 4.0 - 10.5 K/uL   RBC 4.80  4.22 - 5.81 MIL/uL   Hemoglobin 12.2 (*) 13.0 - 17.0 g/dL   HCT 96.2 (*) 95.2 - 84.1 %   MCV 76.0 (*) 78.0 - 100.0 fL   MCH 25.4 (*) 26.0 - 34.0 pg   MCHC 33.4  30.0 - 36.0 g/dL   RDW 32.4 (*) 40.1 - 02.7 %   Platelets 185  150 - 400 K/uL   Neutrophils Relative % 38 (*) 43 - 77 %   Neutro Abs 1.3 (*) 1.7 - 7.7 K/uL   Lymphocytes Relative 49 (*) 12 - 46 %   Lymphs Abs 1.6  0.7 - 4.0 K/uL   Monocytes Relative 14 (*) 3 - 12 %   Monocytes Absolute 0.5  0.1 - 1.0 K/uL   Eosinophils Relative 0  0 - 5 %   Eosinophils Absolute 0.0  0.0 - 0.7 K/uL   Basophils Relative 0  0 - 1 %   Basophils Absolute 0.0  0.0 - 0.1 K/uL  TROPONIN I     Status: None   Collection Time    12/16/12  9:50 AM      Result Value Range   Troponin I <0.30  <0.30 ng/mL   Comment:            Due to the release kinetics of cTnI,     a negative result within the first hours  of the onset of symptoms does not rule out     myocardial infarction with certainty.     If myocardial infarction is still suspected,     repeat the test at appropriate intervals.  URINALYSIS, ROUTINE W REFLEX MICROSCOPIC     Status: None   Collection Time    12/16/12 11:29 AM      Result Value Range   Color, Urine YELLOW  YELLOW   APPearance CLEAR  CLEAR   Specific Gravity, Urine 1.011  1.005 - 1.030   pH 8.0  5.0 - 8.0   Glucose, UA NEGATIVE  NEGATIVE mg/dL   Hgb urine dipstick NEGATIVE  NEGATIVE   Bilirubin Urine NEGATIVE  NEGATIVE    Ketones, ur NEGATIVE  NEGATIVE mg/dL   Protein, ur NEGATIVE  NEGATIVE mg/dL   Urobilinogen, UA 0.2  0.0 - 1.0 mg/dL   Nitrite NEGATIVE  NEGATIVE   Leukocytes, UA NEGATIVE  NEGATIVE   Comment: MICROSCOPIC NOT DONE ON URINES WITH NEGATIVE PROTEIN, BLOOD, LEUKOCYTES, NITRITE, OR GLUCOSE <1000 mg/dL.   *RADIOLOGY REPORT*  Clinical Data: Acute onset left upper quadrant abdominal pain. February 05, 2012. CT ABDOMEN AND PELVIS WITH CONTRAST  Technique: Multidetector CT imaging of the abdomen and pelvis was  performed following the standard protocol during bolus  administration of intravenous contrast.  Contrast: OMNIPAQUE IOHEXOL 300 MG/ML SOLN  Comparison: None.  Findings: Small calcified granuloma is seen in the right lower  lobe. Dependent atelectasis is noted. No pleural or pericardial  effusion.  There are two nonobstructing stones in the lower pole of the left  kidney measuring 0.9 and 0.6 cm. Bilateral renal cysts are also  noted. The kidneys are otherwise unremarkable. The gallbladder is  filled with sludge and stones. There is a small amount of fluid  between the neck of the gallbladder and first portion of the  duodenum. The biliary tree, liver, spleen, adrenal glands and  pancreas appear normal. The stomach, small and large bowel and  appendix are unremarkable. Prostate calcifications incidentally  noted. No lymphadenopathy is present. No focal bony abnormality  is identified.  IMPRESSION:  1. The gallbladder is filled with stones and sludge. A small  amount of fluid is seen interposed between the neck of the  gallbladder and first portion of the duodenum. Finding could be  due to cholecystitis or may be secondary to peptic ulcer disease.  There is there is no free air to suggest bowel perforation.  2. Two nonobstructing stones are present the lower pole left  kidney. Bilateral renal cysts are also noted.  Original Report Authenticated By: Holley Dexter, M.D.  No  results found.  Review of Systems  Constitutional: Negative for fever, chills, weight loss, malaise/fatigue and diaphoresis.  HENT: Negative.   Eyes: Negative.   Respiratory: Negative.   Cardiovascular: Positive for chest pain (some with this pain, better if he turns over.) and claudication (complains of thigh pain bilateral with walking.). Negative for leg swelling and PND.  Gastrointestinal: Positive for heartburn (On PPI), nausea (SOME WITH this pain and earlier today.), abdominal pain (complains of pain RUQ, none currently) and constipation (chronic constipation, take Miralax daily at home.). Negative for vomiting, diarrhea, blood in stool and melena.  Genitourinary: Negative.        He has BPH;  Ok on medicines  Musculoskeletal: Negative.   Skin: Negative for rash.  Neurological: Negative.  Negative for weakness.  Endo/Heme/Allergies: Negative.        He is only on ASA  81 mg daily now  Psychiatric/Behavioral: Negative.    Blood pressure 150/80, pulse 71, temperature 98.5 F (36.9 C), temperature source Oral, resp. rate 16, SpO2 100.00%. Physical Exam  Constitutional: He is oriented to person, place, and time. He appears well-developed and well-nourished. No distress.  Currently pain free, complaining primarily of being thirsty and throat dry.  HENT:  Head: Normocephalic and atraumatic.  Nose: Nose normal.  Eyes: Conjunctivae and EOM are normal. Pupils are equal, round, and reactive to light. Right eye exhibits no discharge. Left eye exhibits no discharge. No scleral icterus.  Neck: Normal range of motion. Neck supple. No JVD present. No tracheal deviation present. No thyromegaly present.  Cardiovascular: Normal rate, regular rhythm and intact distal pulses.   Murmur (II/VI SEM you can hear it across the pericardium.) heard. Respiratory: Effort normal and breath sounds normal. No respiratory distress. He has no wheezes. He has no rales. He exhibits no tenderness.  GI: Soft. Bowel  sounds are normal. He exhibits no distension and no mass. There is no tenderness. There is no rebound and no guarding.  He is currently pain free, and wants to go home and come back Tuesday for surgery.  Musculoskeletal: Normal range of motion. He exhibits no edema and no tenderness.  Lymphadenopathy:    He has no cervical adenopathy.  Neurological: He is alert and oriented to person, place, and time. No cranial nerve deficit.  Skin: Skin is warm and dry. No rash noted. He is not diaphoretic. No erythema. No pallor.  Psychiatric: He has a normal mood and affect. His behavior is normal. Judgment and thought content normal.    Assessment/Plan: 1. Symptomatic Cholelithiasis 2.  CAD, Sp PTCA and 2 taxus stents placed 2010, he is just on Asprin now for anticoagulation. 3.PVD with bilateral claudication 4.hypertension 5.Hyperlipidemia 6.Gout 7.GERD on PPI 8.BPH  Plan:  Pt feels better and has some business he needs to attend.  He has ask to go home and come back, for elective surgery.  I have given him an appointment with Dr. Derrell Lolling on 12/21/12 at 4;30 PM.  He knows he will need medical clearance before surgery, and that he will not have surgery on 12/21/12.  Dr. Jodi Mourning with give him some pain medication and something for nausea prior to D/C.  Robert Estes 12/16/2012, 2:09 PM

## 2012-12-16 NOTE — ED Notes (Signed)
Pt request for something to drink. Pt education re enforced importance of NPO status until consult to general surgery.

## 2012-12-16 NOTE — Consult Note (Signed)
Agree with note. 

## 2012-12-16 NOTE — ED Provider Notes (Signed)
CSN: [id number]     Arrival date & time 12/16/12  1478 History   First MD Initiated Contact with Patient 12/16/12 [phone]     Chief Complaint  Patient presents with  . Abdominal Pain   (Consider location/radiation/quality/duration/timing/severity/associated sxs/prior Treatment) HPI Comments: 67 yo male with CAD/ stents, gallstones, hemorrhoid presents with recurrent pain since Monday after eating.  Initially intermittent stabbing/ cramping, now more constant, back radiation.  No AAA hx.  Nausea.  No fevers or vomiting.  No blood in stools.  Nothing improves.   Patient is a 67 y.o. male presenting with abdominal pain. The history is provided by the patient.  Abdominal Pain Pain location:  RUQ Associated symptoms: nausea   Associated symptoms: no chest pain, no chills, no diarrhea, no dysuria, no fever, no shortness of breath and no vomiting     Past Medical History  Diagnosis Date  . Glaucoma     BOTH EYES  . Hypertension   . Anemia     IRON PILL EVERY OTHER DAY--CAUSE OF ANEMIA UNKOWN  . Enlarged prostate     FLOMAX HELPS PT VOID  . Coronary artery disease     HX OF HEART STENTS X 2   2010--DR. H. SMITH IS PT'S CARDIOLOGIST, MILD AORTIC STENOSIS  . Peripheral vascular disease     CLAUDICATION  . Dizziness   . Hyperlipidemia   . Shortness of breath     WITH EXERTION  . Heart murmur   . GERD (gastroesophageal reflux disease)     OTC PREVACID AS NEEDED  . Hypertension   . Gout    Past Surgical History  Procedure Laterality Date  . Cardiac catheterization  11/27/2008    stents placed  . Eus  12/24/2011    Procedure: FULL UPPER ENDOSCOPIC ULTRASOUND (EUS) RADIAL;  Surgeon: Willis Modena, MD;  Location: [facility];  Service: Endoscopy;  Laterality: N/A;   Family History  Problem Relation Age of Onset  . Breast cancer Mother    History  Substance Use Topics  . Smoking status: Never Smoker   . Smokeless tobacco: Never Used  . Alcohol Use: No    Review of Systems   Constitutional: Positive for appetite change. Negative for fever and chills.  HENT: Negative for congestion.   Eyes: Negative for visual disturbance.  Respiratory: Negative for shortness of breath.   Cardiovascular: Negative for chest pain.  Gastrointestinal: Positive for nausea and abdominal pain. Negative for vomiting and diarrhea.  Genitourinary: Negative for dysuria and flank pain.  Musculoskeletal: Positive for back pain. Negative for neck pain and neck stiffness.  Skin: Negative for rash.  Neurological: Negative for light-headedness and headaches.    Allergies  Codeine  Home Medications   Current Outpatient Rx  Name  Route  Sig  Dispense  Refill  . desoximetasone (TOPICORT) 0.25 % cream   Topical   Apply topically 2 (two) times daily.         Marland Kitchen KRILL OIL 1000 MG CAPS   Oral   Take 1,000 mg by mouth 2 (two) times daily.         . Lidocaine-Hydrocortisone Ace 3-2.5 % KIT   Rectal   Place 1 application rectally 2 (two) times daily.   1 each   1   . metoprolol succinate (TOPROL-XL) 100 MG 24 hr tablet   Oral   Take 100 mg by mouth at bedtime. Take with or immediately following a meal.         . Multiple Vitamin (  MULTIVITAMIN WITH MINERALS) TABS   Oral   Take 1 tablet by mouth daily.         Marland Kitchen omeprazole (PRILOSEC) 40 MG capsule   Oral   Take 40 mg by mouth daily.         . pravastatin (PRAVACHOL) 40 MG tablet      Take 1/4 tablet by mouth daily   90 tablet   0   . Tamsulosin HCl (FLOMAX) 0.4 MG CAPS   Oral   Take 0.4 mg by mouth at bedtime as needed. Bladder         . traMADol (ULTRAM) 50 MG tablet   Oral   Take 50 mg by mouth every 6 (six) hours as needed for pain.         Marland Kitchen travoprost, benzalkonium, (TRAVATAN) 0.004 % ophthalmic solution   Both Eyes   Place 1 drop into both eyes at bedtime. One drop both eyes at bedtim         . zolpidem (AMBIEN) 10 MG tablet   Oral   Take 1 tablet by mouth at bedtime.          BP 146/76   Pulse 65  Temp(Src) 97.7 F (36.5 C) (Oral)  Resp 20  SpO2 100% Physical Exam  Nursing note and vitals reviewed. Constitutional: He is oriented to person, place, and time. He appears well-developed and well-nourished.  HENT:  Head: Normocephalic and atraumatic.  Mild dry mm  Eyes: Conjunctivae are normal. Right eye exhibits no discharge. Left eye exhibits no discharge.  Neck: Normal range of motion. Neck supple. No tracheal deviation present.  Cardiovascular: Normal rate and regular rhythm.   Pulmonary/Chest: Effort normal and breath sounds normal.  Abdominal: Soft. He exhibits no distension. There is tenderness (RUQ moderate, pos Murphy). There is no guarding.  Musculoskeletal: He exhibits no edema.  Neurological: He is alert and oriented to person, place, and time.  Skin: Skin is warm. No rash noted.  Psychiatric: He has a normal mood and affect.    ED Course  Procedures (including critical care time)  EMERGENCY DEPARTMENT BILIARY ULTRASOUND INTERPRETATION "Study: Limited Abdominal Ultrasound of the gallbladder and common bile duct."  INDICATIONS: Abdominal pain, RUQ pain and Nausea Indication: Multiple views of the gallbladder and common bile duct were obtained in real-time with a Multi-frequency probe." PERFORMED BY:  Myself IMAGES ARCHIVED?: Yes FINDINGS: Gallstones present no fluid surrounding, no wall thickening, nl CBD diameter LIMITATIONS: Bowel Gas INTERPRETATION: Cholelithiasis  EMERGENCY DEPARTMENT ULTRASOUND  Study: Limited Retroperitoneal Ultrasound of the Abdominal Aorta.  INDICATIONS:Abdominal pain and Age>55 Multiple views of the abdominal aorta were obtained in real-time from the diaphragmatic hiatus to the aortic bifurcation in transverse planes with a multi-frequency probe. PERFORMED BY: Myself IMAGES ARCHIVED?: Yes FINDINGS: Maximum aortic dimensions are 1.6 cm LIMITATIONS:  Bowel gas INTERPRETATION:  No abdominal aortic aneurysm     Labs  Review Labs Reviewed  COMPREHENSIVE METABOLIC PANEL - Abnormal; Notable for the following:    GFR calc non Af Amer 58 (*)    GFR calc Af Amer 67 (*)    All other components within normal limits  CBC WITH DIFFERENTIAL - Abnormal; Notable for the following:    WBC 3.4 (*)    Hemoglobin 12.2 (*)    HCT 36.5 (*)    MCV 76.0 (*)    MCH 25.4 (*)    RDW 16.4 (*)    Neutrophils Relative % 38 (*)    Neutro Abs 1.3 (*)  Lymphocytes Relative 49 (*)    Monocytes Relative 14 (*)    All other components within normal limits  LACTIC ACID, PLASMA  LIPASE, BLOOD  TROPONIN I  URINALYSIS, ROUTINE W REFLEX MICROSCOPIC   Imaging Review No results found.  EKG Interpretation   None     MUSE not working  Date: 12/16/2012  Rate: 66  Rhythm: normal sinus rhythm  QRS Axis: normal  Intervals: normal  ST/T Wave abnormalities: nonspecific ST changes  Conduction Disutrbances:none  Narrative Interpretation:   Old EKG Reviewed: unchanged    MDM  No diagnosis found. Acute abd pain and with age, bedside US done for aorta and biliary./  Concern for biliary as cause, with cardiac hx/ epig pain and mild lower chest pain episode in ED EKG/ troponin.   With recurrent episode and severe pain in ED rec surgery consult, pt says pain has improved and He wishes to fup outpt.  He has capacity to make decisions and strict reasons to return given. Biliary labs unremarkable.  Pt well appearing on recheck, smiling.  Results and differential diagnosis were discussed with the patient. Close follow up outpatient was discussed, patient comfortable with the plan.   Diagnosis: Biliary colic, RUQ abd pain   Enid Skeens, MD 12/16/12 1052

## 2012-12-16 NOTE — ED Notes (Signed)
Patient is aware that we need a urine specimen, unable to go at this time.

## 2012-12-16 NOTE — ED Notes (Addendum)
Per pt has had right sided abd pain since Monday. Pt reports hx of "gallbladder issues." Pt reports dark stool this am. Pt denies n/v/d.

## 2012-12-16 NOTE — ED Notes (Signed)
Patient has been reminded that we need a urine specimen

## 2012-12-21 ENCOUNTER — Encounter (INDEPENDENT_AMBULATORY_CARE_PROVIDER_SITE_OTHER): Payer: Self-pay | Admitting: General Surgery

## 2012-12-21 ENCOUNTER — Encounter (INDEPENDENT_AMBULATORY_CARE_PROVIDER_SITE_OTHER): Payer: Self-pay

## 2012-12-21 ENCOUNTER — Ambulatory Visit (INDEPENDENT_AMBULATORY_CARE_PROVIDER_SITE_OTHER): Payer: Medicare Other | Admitting: General Surgery

## 2012-12-21 VITALS — BP 116/76 | HR 76 | Temp 97.6°F | Resp 16 | Ht 67.0 in | Wt 168.0 lb

## 2012-12-21 DIAGNOSIS — K8020 Calculus of gallbladder without cholecystitis without obstruction: Secondary | ICD-10-CM

## 2012-12-21 NOTE — Progress Notes (Signed)
Patient ID: Robert Estes, male   DOB: [birth year], 67 y.o.   MRN: [id number]  Chief Complaint  Patient presents with  . Cholelithiasis    HPI Robert Estes is a 67 y.o. male.  The patient is a 66 year old male who was recently seen in the ER secondary to right upper quadrant pain. The patient states this has occurred last 2 weeks. Since his ER visit has gotten better.  Upon evaluation the patient underwent ultrasound which revealed gallstones. Facilities were within normal limits.  Patient has a history of stents by Dr. Katrinka Blazing in 2010. He is currently only on aspirin.  HPI  Past Medical History  Diagnosis Date  . Glaucoma     BOTH EYES  . Hypertension   . Anemia     IRON PILL EVERY OTHER DAY--CAUSE OF ANEMIA UNKOWN  . Enlarged prostate     FLOMAX HELPS PT VOID  . Coronary artery disease     HX OF HEART STENTS X 2   2010--DR. H. SMITH IS PT'S CARDIOLOGIST, MILD AORTIC STENOSIS  . Peripheral vascular disease     CLAUDICATION  . Dizziness   . Hyperlipidemia   . Shortness of breath     WITH EXERTION  . Heart murmur   . GERD (gastroesophageal reflux disease)     OTC PREVACID AS NEEDED  . Hypertension   . Gout     Past Surgical History  Procedure Laterality Date  . Cardiac catheterization  11/27/2008    stents placed  . Eus  12/24/2011    Procedure: FULL UPPER ENDOSCOPIC ULTRASOUND (EUS) RADIAL;  Surgeon: Willis Modena, MD;  Location: [facility];  Service: Endoscopy;  Laterality: N/A;  . Incision and drainage      thrombosed hemorrhoid    Family History  Problem Relation Age of Onset  . Breast cancer Mother     Social History History  Substance Use Topics  . Smoking status: Never Smoker   . Smokeless tobacco: Never Used  . Alcohol Use: No    Allergies  Allergen Reactions  . Codeine Itching    Current Outpatient Prescriptions  Medication Sig Dispense Refill  . acetaminophen (TYLENOL) 500 MG tablet Take 500 mg by mouth every 6 (six) hours as needed.       Marland Kitchen aspirin 81 MG tablet Take 81 mg by mouth daily.      Marland Kitchen desonide (DESOWEN) 0.05 % cream       . KRILL OIL 1000 MG CAPS Take 1,000 mg by mouth 2 (two) times daily.      . metoprolol succinate (TOPROL-XL) 100 MG 24 hr tablet Take 100 mg by mouth at bedtime. Take with or immediately following a meal.      . Multiple Vitamin (MULTIVITAMIN WITH MINERALS) TABS Take 1 tablet by mouth daily.      Marland Kitchen omeprazole (PRILOSEC) 40 MG capsule Take 40 mg by mouth daily.      . ondansetron (ZOFRAN ODT) 4 MG disintegrating tablet 4mg  ODT q4 hours prn nausea/vomit  6 tablet  0  . pravastatin (PRAVACHOL) 40 MG tablet Take 1/4 tablet by mouth daily  90 tablet  0  . tamsulosin (FLOMAX) 0.4 MG CAPS capsule       . travoprost, benzalkonium, (TRAVATAN) 0.004 % ophthalmic solution Place 1 drop into both eyes at bedtime. One drop both eyes at bedtim      . zolpidem (AMBIEN) 10 MG tablet Take 1 tablet by mouth at bedtime.  No current facility-administered medications for this visit.    Review of Systems Review of Systems  Constitutional: Negative.   HENT: Negative.   Respiratory: Negative.   Gastrointestinal: Negative.   Neurological: Negative.   All other systems reviewed and are negative.    Blood pressure 116/76, pulse 76, temperature 97.6 F (36.4 C), temperature source Temporal, resp. rate 16, height 5\' 7"  (1.702 m), weight 168 lb (76.204 kg).  Physical Exam Physical Exam  Constitutional: He is oriented to person, place, and time. He appears well-developed and well-nourished.  HENT:  Head: Normocephalic and atraumatic.  Eyes: Conjunctivae and EOM are normal. Pupils are equal, round, and reactive to light.  Neck: Normal range of motion. Neck supple.  Cardiovascular: Normal rate, regular rhythm and normal heart sounds.   Pulmonary/Chest: Effort normal and breath sounds normal.  Abdominal: Soft. Bowel sounds are normal.  Musculoskeletal: Normal range of motion.  Neurological: He is alert and  oriented to person, place, and time.    Data Reviewed Ultrasound reveals gallstones.  LFTs from within normal limits  Assessment    67 year old male with symptomatic gallstones     Plan    1. The patient will need cardiac evaluation by Dr. Katrinka Blazing prior to scheduling. 2. Once cardiac evaluation has received we will schedule him for a laparoscopic cholecystectomy with intraoperative cholangiogram. 3.All risks and benefits were discussed with the patient to generally include: infection, bleeding, possible need for post op ERCP, damage to the bile ducts, and bile leak. Alternatives were offered and described.  All questions were answered and the patient voiced understanding of the procedure and wishes to proceed at this point with a laparoscopic cholecystectomy         Marigene Ehlers., [facility] 12/21/2012, 4:50 PM

## 2012-12-22 ENCOUNTER — Telehealth (INDEPENDENT_AMBULATORY_CARE_PROVIDER_SITE_OTHER): Payer: Self-pay | Admitting: General Surgery

## 2012-12-22 NOTE — Telephone Encounter (Signed)
Pt to call back to schedule surgery / wishes to call facilities and get price quotes

## 2013-01-05 ENCOUNTER — Other Ambulatory Visit (INDEPENDENT_AMBULATORY_CARE_PROVIDER_SITE_OTHER): Payer: Self-pay | Admitting: General Surgery

## 2013-01-12 NOTE — Pre-Procedure Instructions (Signed)
Robert Estes [facility]  01/12/2013   Your procedure is scheduled on: Tuesday, Dec. 30th.    Report to [facility]  2 * 3 at  9:45 AM.             [facility] Parking is available)   Call this number if you have problems the morning of surgery: [phone]   Remember:   Do not eat food or drink liquids after midnight Monday.   Take these medicines the morning of surgery with A SIP OF WATER: Metoprolol, Omeprazole, Flomax   Do not wear jewelry--rings & watches.   Do not wear lotions, powders, or colognes. You may NOT wear deodorant.   Men may shave face and neck.   Do not bring valuables to the hospital.  [facility] is not responsible for any belongings or valuables.               Contacts, dentures or bridgework may not be worn into surgery.  Leave suitcase in the car. After surgery it may be brought to your room.  For patients admitted to the hospital, discharge time is determined by your treatment team.               Name and phone number of your driver:  LAURANCE HEIDE    [facility]      Special Instructions: Shower using CHG 2 nights before surgery and the night before surgery.  If you shower the day of surgery use CHG.  Use special wash - you have one bottle of CHG for all showers.  You should use approximately 1/3 of the bottle for each shower.   Please read over the following fact sheets that you were given: Pain Booklet and Surgical Site Infection Prevention

## 2013-01-13 ENCOUNTER — Encounter (HOSPITAL_COMMUNITY)
Admission: RE | Admit: 2013-01-13 | Discharge: 2013-01-13 | Disposition: A | Discharge status: Home / Self Care | Payer: Medicare Other | Source: Ambulatory Visit | Attending: Anesthesiology | Admitting: Anesthesiology

## 2013-01-13 ENCOUNTER — Encounter (HOSPITAL_COMMUNITY)
Admission: RE | Admit: 2013-01-13 | Discharge: 2013-01-13 | Disposition: A | Discharge status: Home / Self Care | Payer: Medicare Other | Source: Ambulatory Visit | Attending: General Surgery | Admitting: General Surgery

## 2013-01-13 ENCOUNTER — Encounter (HOSPITAL_COMMUNITY): Payer: Self-pay

## 2013-01-13 DIAGNOSIS — Z01812 Encounter for preprocedural laboratory examination: Secondary | ICD-10-CM | POA: Insufficient documentation

## 2013-01-13 DIAGNOSIS — Z01818 Encounter for other preprocedural examination: Secondary | ICD-10-CM | POA: Insufficient documentation

## 2013-01-13 LAB — COMPREHENSIVE METABOLIC PANEL
ALT: 27 U/L (ref 0–53)
AST: 31 U/L (ref 0–37)
Albumin: 3.9 g/dL (ref 3.5–5.2)
Alkaline Phosphatase: 85 U/L (ref 39–117)
BUN: 13 mg/dL (ref 6–23)
CO2: 25 mEq/L (ref 19–32)
Calcium: 9.1 mg/dL (ref 8.4–10.5)
Chloride: 105 mEq/L (ref 96–112)
Creatinine, Ser: 1.24 mg/dL (ref 0.50–1.35)
GFR calc Af Amer: 69 mL/min — ABNORMAL LOW (ref >90)
GFR calc non Af Amer: 59 mL/min — ABNORMAL LOW (ref >90)
Glucose, Bld: 87 mg/dL (ref 70–99)
Potassium: 4.3 mEq/L (ref 3.5–5.1)
Sodium: 141 mEq/L (ref 135–145)
Total Bilirubin: 0.2 mg/dL — ABNORMAL LOW (ref 0.3–1.2)
Total Protein: 6.7 g/dL (ref 6.0–8.3)

## 2013-01-13 LAB — CBC
HCT: 35 % — ABNORMAL LOW (ref 39.0–52.0)
Hemoglobin: 11.5 g/dL — ABNORMAL LOW (ref 13.0–17.0)
MCH: 25.7 pg — ABNORMAL LOW (ref 26.0–34.0)
MCHC: 32.9 g/dL (ref 30.0–36.0)
MCV: 78.3 fL (ref 78.0–100.0)
Platelets: 181 10*3/uL (ref 150–400)
RBC: 4.47 MIL/uL (ref 4.22–5.81)
RDW: 16.1 % — ABNORMAL HIGH (ref 11.5–15.5)
WBC: 3.8 10*3/uL — ABNORMAL LOW (ref 4.0–10.5)

## 2013-01-13 NOTE — Progress Notes (Signed)
Patient sees Dr. Mendel Ryder for his heart.  He as 2 stents, and he saw Dr. Katrinka Blazing in/around Sept. 2014.   No cardiac c/o at present.   DA

## 2013-01-13 NOTE — Progress Notes (Signed)
01/13/13 1123  OBSTRUCTIVE SLEEP APNEA  Have you ever been diagnosed with sleep apnea through a sleep study? No  Do you snore loudly (loud enough to be heard through closed doors)?  0  Do you often feel tired, fatigued, or sleepy during the daytime? 1 (has family problems)  Has anyone observed you stop breathing during your sleep? 0  Do you have, or are you being treated for high blood pressure? 1  BMI more than 35 kg/m2? 0  Age over 65 years old? 1  Neck circumference greater than 40 cm/18 inches? 0  Gender: 1  Obstructive Sleep Apnea Score 4  Score 4 or greater  Results sent to PCP

## 2013-01-18 ENCOUNTER — Encounter (HOSPITAL_COMMUNITY): Payer: Self-pay | Admitting: Pharmacy Technician

## 2013-01-24 MED ORDER — CEFAZOLIN SODIUM-DEXTROSE 2-3 GM-% IV SOLR
2.0000 g | INTRAVENOUS | Status: AC
  Administered 2013-01-25: 2 g via INTRAVENOUS
  Filled 2013-01-24: qty 50

## 2013-01-25 ENCOUNTER — Encounter (HOSPITAL_COMMUNITY)
Admission: RE | Disposition: A | Discharge status: Home / Self Care | Payer: Self-pay | Source: Ambulatory Visit | Attending: General Surgery

## 2013-01-25 ENCOUNTER — Encounter (HOSPITAL_COMMUNITY): Payer: Self-pay | Admitting: *Deleted

## 2013-01-25 ENCOUNTER — Ambulatory Visit (HOSPITAL_COMMUNITY): Payer: Medicare Other

## 2013-01-25 ENCOUNTER — Ambulatory Visit (HOSPITAL_COMMUNITY)
Admission: RE | Admit: 2013-01-25 | Discharge: 2013-01-26 | Disposition: A | Discharge status: Home / Self Care | Payer: Medicare Other | Source: Ambulatory Visit | Attending: General Surgery | Admitting: General Surgery

## 2013-01-25 ENCOUNTER — Ambulatory Visit (HOSPITAL_COMMUNITY): Payer: Medicare Other | Admitting: Anesthesiology

## 2013-01-25 ENCOUNTER — Encounter (HOSPITAL_COMMUNITY): Payer: Medicare Other | Admitting: Anesthesiology

## 2013-01-25 DIAGNOSIS — H409 Unspecified glaucoma: Secondary | ICD-10-CM | POA: Insufficient documentation

## 2013-01-25 DIAGNOSIS — E785 Hyperlipidemia, unspecified: Secondary | ICD-10-CM | POA: Insufficient documentation

## 2013-01-25 DIAGNOSIS — N400 Enlarged prostate without lower urinary tract symptoms: Secondary | ICD-10-CM | POA: Insufficient documentation

## 2013-01-25 DIAGNOSIS — D649 Anemia, unspecified: Secondary | ICD-10-CM | POA: Insufficient documentation

## 2013-01-25 DIAGNOSIS — I2510 Atherosclerotic heart disease of native coronary artery without angina pectoris: Secondary | ICD-10-CM | POA: Insufficient documentation

## 2013-01-25 DIAGNOSIS — K219 Gastro-esophageal reflux disease without esophagitis: Secondary | ICD-10-CM | POA: Insufficient documentation

## 2013-01-25 DIAGNOSIS — I739 Peripheral vascular disease, unspecified: Secondary | ICD-10-CM | POA: Insufficient documentation

## 2013-01-25 DIAGNOSIS — K8010 Calculus of gallbladder with chronic cholecystitis without obstruction: Secondary | ICD-10-CM

## 2013-01-25 DIAGNOSIS — R42 Dizziness and giddiness: Secondary | ICD-10-CM | POA: Insufficient documentation

## 2013-01-25 DIAGNOSIS — Z9049 Acquired absence of other specified parts of digestive tract: Secondary | ICD-10-CM

## 2013-01-25 DIAGNOSIS — R011 Cardiac murmur, unspecified: Secondary | ICD-10-CM | POA: Insufficient documentation

## 2013-01-25 DIAGNOSIS — I10 Essential (primary) hypertension: Secondary | ICD-10-CM | POA: Insufficient documentation

## 2013-01-25 DIAGNOSIS — R0602 Shortness of breath: Secondary | ICD-10-CM | POA: Insufficient documentation

## 2013-01-25 HISTORY — DX: Iron deficiency anemia, unspecified: D50.9

## 2013-01-25 HISTORY — DX: Unspecified osteoarthritis, unspecified site: M19.90

## 2013-01-25 HISTORY — PX: CHOLECYSTECTOMY: SHX55

## 2013-01-25 HISTORY — DX: Shortness of breath: R06.02

## 2013-01-25 HISTORY — PX: LAPAROSCOPIC CHOLECYSTECTOMY: SUR755

## 2013-01-25 HISTORY — DX: Migraine, unspecified, not intractable, without status migrainosus: G43.909

## 2013-01-25 SURGERY — LAPAROSCOPIC CHOLECYSTECTOMY WITH INTRAOPERATIVE CHOLANGIOGRAM
Anesthesia: General | Site: Abdomen

## 2013-01-25 MED ORDER — BUPIVACAINE HCL 0.25 % IJ SOLN
INTRAMUSCULAR | Status: DC | PRN
  Administered 2013-01-25: 11 mL

## 2013-01-25 MED ORDER — HYDROMORPHONE HCL PF 1 MG/ML IJ SOLN
1.0000 mg | INTRAMUSCULAR | Status: DC | PRN
  Administered 2013-01-25 (×2): 1 mg via INTRAVENOUS
  Filled 2013-01-25 (×2): qty 1

## 2013-01-25 MED ORDER — 0.9 % SODIUM CHLORIDE (POUR BTL) OPTIME
TOPICAL | Status: DC | PRN
  Administered 2013-01-25: 1000 mL

## 2013-01-25 MED ORDER — HYDROCODONE-ACETAMINOPHEN 5-325 MG PO TABS: 1.0000 | ORAL_TABLET | ORAL | Status: DC | PRN

## 2013-01-25 MED ORDER — DEXTROSE-NACL 5-0.9 % IV SOLN
INTRAVENOUS | Status: DC
  Administered 2013-01-25 – 2013-01-26 (×2): via INTRAVENOUS

## 2013-01-25 MED ORDER — OXYCODONE HCL 5 MG PO TABS
5.0000 mg | ORAL_TABLET | ORAL | Status: DC | PRN
  Administered 2013-01-26 (×2): 10 mg via ORAL
  Filled 2013-01-25 (×2): qty 2

## 2013-01-25 MED ORDER — MENTHOL 3 MG MT LOZG
1.0000 | LOZENGE | OROMUCOSAL | Status: DC | PRN
  Administered 2013-01-25: 3 mg via ORAL
  Filled 2013-01-25: qty 9

## 2013-01-25 MED ORDER — MIDAZOLAM HCL 2 MG/2ML IJ SOLN: 1.0000 mg | INTRAMUSCULAR | Status: DC | PRN

## 2013-01-25 MED ORDER — LIDOCAINE HCL (CARDIAC) 20 MG/ML IV SOLN
INTRAVENOUS | Status: DC | PRN
  Administered 2013-01-25: 80 mg via INTRAVENOUS

## 2013-01-25 MED ORDER — NEOSTIGMINE METHYLSULFATE 1 MG/ML IJ SOLN
INTRAMUSCULAR | Status: DC | PRN
  Administered 2013-01-25: 4 mg via INTRAVENOUS

## 2013-01-25 MED ORDER — PROMETHAZINE HCL 25 MG/ML IJ SOLN
6.2500 mg | INTRAMUSCULAR | Status: AC | PRN
  Administered 2013-01-25: 12.5 mg via INTRAVENOUS
  Administered 2013-01-25 (×2): 6.25 mg via INTRAVENOUS

## 2013-01-25 MED ORDER — MIDAZOLAM HCL 5 MG/5ML IJ SOLN
INTRAMUSCULAR | Status: DC | PRN
  Administered 2013-01-25 (×2): 1 mg via INTRAVENOUS

## 2013-01-25 MED ORDER — ONDANSETRON HCL 4 MG/2ML IJ SOLN: 4.0000 mg | Freq: Four times a day (QID) | INTRAMUSCULAR | Status: DC | PRN

## 2013-01-25 MED ORDER — IOHEXOL 300 MG/ML  SOLN
INTRAMUSCULAR | Status: DC | PRN
  Administered 2013-01-25: 4 mL via INTRAVENOUS

## 2013-01-25 MED ORDER — PANTOPRAZOLE SODIUM 40 MG PO TBEC
40.0000 mg | DELAYED_RELEASE_TABLET | Freq: Every day | ORAL | Status: DC
  Administered 2013-01-26: 40 mg via ORAL
  Filled 2013-01-25: qty 1

## 2013-01-25 MED ORDER — ACETAMINOPHEN 10 MG/ML IV SOLN
INTRAVENOUS | Status: DC | PRN
  Administered 2013-01-25: 1000 mg via INTRAVENOUS

## 2013-01-25 MED ORDER — ACETAMINOPHEN 325 MG PO TABS: 650.0000 mg | ORAL_TABLET | ORAL | Status: DC | PRN

## 2013-01-25 MED ORDER — HYDROMORPHONE HCL PF 1 MG/ML IJ SOLN
0.2500 mg | INTRAMUSCULAR | Status: DC | PRN
  Administered 2013-01-25: 0.25 mg via INTRAVENOUS
  Administered 2013-01-25: 0.5 mg via INTRAVENOUS

## 2013-01-25 MED ORDER — METOCLOPRAMIDE HCL 5 MG/ML IJ SOLN
10.0000 mg | Freq: Four times a day (QID) | INTRAMUSCULAR | Status: DC
  Administered 2013-01-25 – 2013-01-26 (×3): 10 mg via INTRAVENOUS
  Filled 2013-01-25 (×7): qty 2

## 2013-01-25 MED ORDER — LACTATED RINGERS IV SOLN
INTRAVENOUS | Status: DC | PRN
  Administered 2013-01-25 (×2): via INTRAVENOUS

## 2013-01-25 MED ORDER — ACETAMINOPHEN 650 MG RE SUPP: 650.0000 mg | RECTAL | Status: DC | PRN

## 2013-01-25 MED ORDER — METOCLOPRAMIDE HCL 5 MG/ML IJ SOLN
INTRAMUSCULAR | Status: AC
  Administered 2013-01-25: 10 mg
  Filled 2013-01-25: qty 2

## 2013-01-25 MED ORDER — ACETAMINOPHEN 10 MG/ML IV SOLN
INTRAVENOUS | Status: AC
  Filled 2013-01-25: qty 100

## 2013-01-25 MED ORDER — ONDANSETRON HCL 4 MG/2ML IJ SOLN
INTRAMUSCULAR | Status: DC | PRN
  Administered 2013-01-25: 4 mg via INTRAVENOUS

## 2013-01-25 MED ORDER — PROPOFOL 10 MG/ML IV BOLUS
INTRAVENOUS | Status: DC | PRN
  Administered 2013-01-25: 150 mg via INTRAVENOUS

## 2013-01-25 MED ORDER — HYDROXYZINE HCL 10 MG PO TABS
10.0000 mg | ORAL_TABLET | ORAL | Status: DC | PRN
  Filled 2013-01-25: qty 1

## 2013-01-25 MED ORDER — TAMSULOSIN HCL 0.4 MG PO CAPS
0.8000 mg | ORAL_CAPSULE | Freq: Every day | ORAL | Status: DC
  Administered 2013-01-25: 0.8 mg via ORAL
  Filled 2013-01-25 (×2): qty 2

## 2013-01-25 MED ORDER — OXYCODONE-ACETAMINOPHEN 10-325 MG PO TABS: 1.0000 | ORAL_TABLET | ORAL | Status: DC | PRN

## 2013-01-25 MED ORDER — SODIUM CHLORIDE 0.9 % IJ SOLN: 3.0000 mL | INTRAMUSCULAR | Status: DC | PRN

## 2013-01-25 MED ORDER — CHLORHEXIDINE GLUCONATE 4 % EX LIQD: 1.0000 "application " | Freq: Once | CUTANEOUS | Status: DC

## 2013-01-25 MED ORDER — FENTANYL CITRATE 0.05 MG/ML IJ SOLN
INTRAMUSCULAR | Status: DC | PRN
  Administered 2013-01-25: 125 ug via INTRAVENOUS

## 2013-01-25 MED ORDER — SODIUM CHLORIDE 0.9 % IJ SOLN: 3.0000 mL | Freq: Two times a day (BID) | INTRAMUSCULAR | Status: DC

## 2013-01-25 MED ORDER — SODIUM CHLORIDE 0.9 % IR SOLN
Status: DC | PRN
  Administered 2013-01-25: 1000 mL

## 2013-01-25 MED ORDER — ONDANSETRON HCL 4 MG/2ML IJ SOLN
4.0000 mg | INTRAMUSCULAR | Status: DC
  Administered 2013-01-25 – 2013-01-26 (×5): 4 mg via INTRAVENOUS
  Filled 2013-01-25 (×5): qty 2

## 2013-01-25 MED ORDER — HYDROMORPHONE HCL PF 1 MG/ML IJ SOLN
INTRAMUSCULAR | Status: AC
  Filled 2013-01-25: qty 1

## 2013-01-25 MED ORDER — ROCURONIUM BROMIDE 100 MG/10ML IV SOLN
INTRAVENOUS | Status: DC | PRN
  Administered 2013-01-25: 40 mg via INTRAVENOUS

## 2013-01-25 MED ORDER — FENTANYL CITRATE 0.05 MG/ML IJ SOLN: 50.0000 ug | Freq: Once | INTRAMUSCULAR | Status: DC

## 2013-01-25 MED ORDER — PROMETHAZINE HCL 25 MG/ML IJ SOLN
INTRAMUSCULAR | Status: AC
  Filled 2013-01-25: qty 1

## 2013-01-25 MED ORDER — SODIUM CHLORIDE 0.9 % IV SOLN: 250.0000 mL | INTRAVENOUS | Status: DC | PRN

## 2013-01-25 MED ORDER — ZOLPIDEM TARTRATE 5 MG PO TABS
5.0000 mg | ORAL_TABLET | Freq: Every day | ORAL | Status: DC
  Administered 2013-01-25: 5 mg via ORAL
  Filled 2013-01-25: qty 1

## 2013-01-25 MED ORDER — ONDANSETRON HCL 4 MG/2ML IJ SOLN
INTRAMUSCULAR | Status: AC
  Administered 2013-01-25: 4 mg
  Filled 2013-01-25: qty 2

## 2013-01-25 MED ORDER — DOCUSATE SODIUM 100 MG PO CAPS
100.0000 mg | ORAL_CAPSULE | Freq: Two times a day (BID) | ORAL | Status: DC
  Administered 2013-01-25 – 2013-01-26 (×2): 100 mg via ORAL
  Filled 2013-01-25 (×2): qty 1

## 2013-01-25 MED ORDER — METOPROLOL SUCCINATE ER 50 MG PO TB24
50.0000 mg | ORAL_TABLET | Freq: Every day | ORAL | Status: DC
  Administered 2013-01-25: 50 mg via ORAL
  Filled 2013-01-25 (×2): qty 1

## 2013-01-25 SURGICAL SUPPLY — 48 items
APL SKNCLS STERI-STRIP NONHPOA (GAUZE/BANDAGES/DRESSINGS) ×1
APPLIER CLIP 5 13 M/L LIGAMAX5 (MISCELLANEOUS) ×2
APR CLP MED LRG 5 ANG JAW (MISCELLANEOUS) ×1
BAG SPEC RTRVL LRG 6X4 10 (ENDOMECHANICALS)
BENZOIN TINCTURE PRP APPL 2/3 (GAUZE/BANDAGES/DRESSINGS) ×2 IMPLANT
CANISTER SUCTION 2500CC (MISCELLANEOUS) ×2 IMPLANT
CHLORAPREP W/TINT 26ML (MISCELLANEOUS) ×2 IMPLANT
CLIP APPLIE 5 13 M/L LIGAMAX5 (MISCELLANEOUS) IMPLANT
CLIP LIGATING HEMO O LOK GREEN (MISCELLANEOUS) ×3 IMPLANT
COVER MAYO STAND STRL (DRAPES) ×2 IMPLANT
COVER SURGICAL LIGHT HANDLE (MISCELLANEOUS) ×2 IMPLANT
COVER TRANSDUCER ULTRASND (DRAPES) IMPLANT
DEVICE TROCAR PUNCTURE CLOSURE (ENDOMECHANICALS) ×2 IMPLANT
DRAPE C-ARM 42X72 X-RAY (DRAPES) ×2 IMPLANT
DRAPE UTILITY 15X26 W/TAPE STR (DRAPE) ×4 IMPLANT
ELECT REM PT RETURN 9FT ADLT (ELECTROSURGICAL) ×2
ELECTRODE REM PT RTRN 9FT ADLT (ELECTROSURGICAL) ×1 IMPLANT
GAUZE SPONGE 2X2 8PLY STRL LF (GAUZE/BANDAGES/DRESSINGS) ×1 IMPLANT
GLOVE BIO SURGEON STRL SZ7 (GLOVE) ×1 IMPLANT
GLOVE BIO SURGEON STRL SZ7.5 (GLOVE) ×3 IMPLANT
GLOVE BIOGEL PI IND STRL 7.0 (GLOVE) IMPLANT
GLOVE BIOGEL PI IND STRL 7.5 (GLOVE) IMPLANT
GLOVE BIOGEL PI INDICATOR 7.0 (GLOVE) ×1
GLOVE BIOGEL PI INDICATOR 7.5 (GLOVE) ×1
GOWN STRL NON-REIN LRG LVL3 (GOWN DISPOSABLE) ×6 IMPLANT
GOWN STRL REIN XL XLG (GOWN DISPOSABLE) ×2 IMPLANT
IV CATH 14GX2 1/4 (CATHETERS) ×2 IMPLANT
KIT BASIN OR (CUSTOM PROCEDURE TRAY) ×2 IMPLANT
KIT ROOM TURNOVER OR (KITS) ×2 IMPLANT
NDL INSUFFLATION 14GA 120MM (NEEDLE) ×1 IMPLANT
NEEDLE INSUFFLATION 14GA 120MM (NEEDLE) ×2 IMPLANT
NS IRRIG 1000ML POUR BTL (IV SOLUTION) ×2 IMPLANT
PAD ARMBOARD 7.5X6 YLW CONV (MISCELLANEOUS) ×4 IMPLANT
POUCH SPECIMEN RETRIEVAL 10MM (ENDOMECHANICALS) IMPLANT
SCISSORS LAP 5X35 DISP (ENDOMECHANICALS) ×2 IMPLANT
SET CHOLANGIOGRAPHY FRANKLIN (SET/KITS/TRAYS/PACK) ×2 IMPLANT
SET IRRIG TUBING LAPAROSCOPIC (IRRIGATION / IRRIGATOR) ×2 IMPLANT
SLEEVE ENDOPATH XCEL 5M (ENDOMECHANICALS) ×2 IMPLANT
SPECIMEN JAR MEDIUM (MISCELLANEOUS) ×1 IMPLANT
SPECIMEN JAR SMALL (MISCELLANEOUS) ×1 IMPLANT
SPONGE GAUZE 2X2 STER 10/PKG (GAUZE/BANDAGES/DRESSINGS) ×1
SUT MNCRL AB 3-0 PS2 18 (SUTURE) ×2 IMPLANT
SUT VICRYL 0 UR6 27IN ABS (SUTURE) ×1 IMPLANT
TOWEL OR 17X24 6PK STRL BLUE (TOWEL DISPOSABLE) ×1 IMPLANT
TOWEL OR 17X26 10 PK STRL BLUE (TOWEL DISPOSABLE) ×2 IMPLANT
TRAY LAPAROSCOPIC (CUSTOM PROCEDURE TRAY) ×2 IMPLANT
TROCAR XCEL NON-BLD 11X100MML (ENDOMECHANICALS) ×2 IMPLANT
TROCAR XCEL NON-BLD 5MMX100MML (ENDOMECHANICALS) ×2 IMPLANT

## 2013-01-25 NOTE — Progress Notes (Signed)
Dr Derrell Lolling states to give Zofran 4 mg for Nausea.

## 2013-01-25 NOTE — Op Note (Addendum)
01/25/2013  12:01 PM  PATIENT:  Robert Estes  67 y.o. male  PRE-OPERATIVE DIAGNOSIS:  SYMPTOMATIC CHOLELITHIASIS  POST-OPERATIVE DIAGNOSIS:  SYMPTOMATIC CHOLELITHIASIS  PROCEDURE:  Procedure(s): LAPAROSCOPIC CHOLECYSTECTOMY WITH INTRAOPERATIVE CHOLANGIOGRAM (N/A)  SURGEON:  Surgeon(s) and Role:    * Axel Filler, MD - Primary  PHYSICIAN ASSISTANT:   ASSISTANTS: none   ANESTHESIA:   general  EBL:  10cc Total I/O In: 1000 [I.V.:1000] Out: -   BLOOD ADMINISTERED:none  DRAINS: none   LOCAL MEDICATIONS USED:  MARCAINE     SPECIMEN:  Source of Specimen:  gallbladder  DISPOSITION OF SPECIMEN:  PATHOLOGY  COUNTS:  YES  TOURNIQUET:  * No tourniquets in log *  DICTATION: .Dragon Dictation  Findings:Chronic inflammation of the GB and normal IOC  Indications for procedure: Pt is a 67 y/o M with RUQ pain and seen to have gallstones.   Details of the procedure: The patient was taken to the operating and placed in the supine position with bilateral SCDs in place. A time out was called and all facts were verified. A pneumoperitoneum was obtained via A Veress needle technique to a pressure of 14mm of mercury. A 5mm trochar was then placed in the right upper quadrant under visualization, and there were no injuries to any abdominal organs. A 11 mm port was then placed in the umbilical region after infiltrating with local anesthesia under direct visualization. A second epigastric port was placed under direct visualization.   There seemed to be a subrectus hematoma.  A small skin incision was made and an Endoclose was used to ligate the right inferior epigastric vessle superior and inferior to where I thought the puncture was.  The gallbladder was identified and retracted, the peritoneum was then sharply dissected from the gallbladder and this dissection was carried down to Calot's triangle. The cystic duct was identified and stripped away circumferentially and seen going into the  gallbladder 360, the critical angle was obtained. It was noted to be very dilated and large. A Cook catheter was used to perform an intraoperative cholangiogram. The cystic duct and common bile duct were seen free of filling defects. 2 clips were placed proximally one distally and the cystic duct transected. The cystic artery was identified and 2 clips placed proximally and one distally and transected. We then proceeded to remove the gallbladder off the hepatic fossa with Bovie cautery. A retrieval bag was then placed in the abdomen and gallbladder placed in the bag. The hepatic fossa was then reexamined and hemostasis was achieved with Bovie cautery and was excellent at this portion of the case. The subhepatic fossa and perihepatic fossa was then irrigated until the effluent was clear. The 11 mm trocar fascia was reapproximated with the Endo Close #1 Vicryl x2. The pneumoperitoneum was evacuated and all trochars removed under direct visulalization. The skin was then closed with 4-0 Monocryl and the skin dressed with Steri-Strips, gauze, and tape. The patient was awaken from general anesthesia and taken to the recovery room in stable condition.    PLAN OF CARE: Discharge to home after PACU  PATIENT DISPOSITION:  PACU - hemodynamically stable.   Delay start of Pharmacological VTE agent (>24hrs) due to surgical blood loss or risk of bleeding: not applicable

## 2013-01-25 NOTE — Transfer of Care (Signed)
Immediate Anesthesia Transfer of Care Note  Patient: Robert Estes [facility]  Procedure(s) Performed: Procedure(s): LAPAROSCOPIC CHOLECYSTECTOMY WITH INTRAOPERATIVE CHOLANGIOGRAM (N/A)  Patient Location: PACU  Anesthesia Type:General  Level of Consciousness: awake, alert , oriented and patient cooperative  Airway & Oxygen Therapy: Patient Spontanous Breathing and Patient connected to nasal cannula oxygen  Post-op Assessment: Report given to PACU RN, Post -op Vital signs reviewed and stable and Patient moving all extremities  Post vital signs: Reviewed and stable  Complications: No apparent anesthesia complications

## 2013-01-25 NOTE — Anesthesia Procedure Notes (Signed)
Procedure Name: Intubation Date/Time: 01/25/2013 10:34 AM Performed by: Coralee Rud Pre-anesthesia Checklist: Patient identified, Emergency Drugs available, Suction available and Patient being monitored Patient Re-evaluated:Patient Re-evaluated prior to inductionOxygen Delivery Method: Circle system utilized Preoxygenation: Pre-oxygenation with 100% oxygen Intubation Type: IV induction Ventilation: Mask ventilation without difficulty Laryngoscope Size: Miller and 3 Grade View: Grade II Tube type: Oral Tube size: 8.0 mm Number of attempts: 1 Airway Equipment and Method: Stylet Placement Confirmation: ETT inserted through vocal cords under direct vision,  positive ETCO2 and breath sounds checked- equal and bilateral Secured at: 23 cm Tube secured with: Tape Dental Injury: Teeth and Oropharynx as per pre-operative assessment

## 2013-01-25 NOTE — Progress Notes (Signed)
Dr Gypsy Balsam states pt may have additional 12.5 mg phenergan

## 2013-01-25 NOTE — H&P (Signed)
HPI  Robert Estes is a 67 y.o. male. The patient is a 67 year old male who was recently seen in the ER secondary to right upper quadrant pain. The patient states this has occurred last 2 weeks. Since his ER visit has gotten better.  Upon evaluation the patient underwent ultrasound which revealed gallstones. Facilities were within normal limits.  Patient has a history of stents by Dr. Katrinka Blazing in 2010. He is currently only on aspirin.  HPI  Past Medical History   Diagnosis  Date   .  Glaucoma      BOTH EYES   .  Hypertension    .  Anemia      IRON PILL EVERY OTHER DAY--CAUSE OF ANEMIA UNKOWN   .  Enlarged prostate      FLOMAX HELPS PT VOID   .  Coronary artery disease      HX OF HEART STENTS X 2 2010--DR. H. SMITH IS PT'S CARDIOLOGIST, MILD AORTIC STENOSIS   .  Peripheral vascular disease      CLAUDICATION   .  Dizziness    .  Hyperlipidemia    .  Shortness of breath      WITH EXERTION   .  Heart murmur    .  GERD (gastroesophageal reflux disease)      OTC PREVACID AS NEEDED   .  Hypertension    .  Gout     Past Surgical History   Procedure  Laterality  Date   .  Cardiac catheterization   11/27/2008     stents placed   .  Eus   12/24/2011     Procedure: FULL UPPER ENDOSCOPIC ULTRASOUND (EUS) RADIAL; Surgeon: Willis Modena, MD; Location: [facility]; Service: Endoscopy; Laterality: N/A;   .  Incision and drainage       thrombosed hemorrhoid    Family History   Problem  Relation  Age of Onset   .  Breast cancer  Mother    Social History  History   Substance Use Topics   .  Smoking status:  Never Smoker   .  Smokeless tobacco:  Never Used   .  Alcohol Use:  No    Allergies   Allergen  Reactions   .  Codeine  Itching    Current Outpatient Prescriptions   Medication  Sig  Dispense  Refill   .  acetaminophen (TYLENOL) 500 MG tablet  Take 500 mg by mouth every 6 (six) hours as needed.     Marland Kitchen  aspirin 81 MG tablet  Take 81 mg by mouth daily.     Marland Kitchen  desonide (DESOWEN)  0.05 % cream      .  KRILL OIL 1000 MG CAPS  Take 1,000 mg by mouth 2 (two) times daily.     .  metoprolol succinate (TOPROL-XL) 100 MG 24 hr tablet  Take 100 mg by mouth at bedtime. Take with or immediately following a meal.     .  Multiple Vitamin (MULTIVITAMIN WITH MINERALS) TABS  Take 1 tablet by mouth daily.     Marland Kitchen  omeprazole (PRILOSEC) 40 MG capsule  Take 40 mg by mouth daily.     .  ondansetron (ZOFRAN ODT) 4 MG disintegrating tablet  4mg  ODT q4 hours prn nausea/vomit  6 tablet  0   .  pravastatin (PRAVACHOL) 40 MG tablet  Take 1/4 tablet by mouth daily  90 tablet  0   .  tamsulosin (FLOMAX) 0.4 MG CAPS capsule      .  travoprost, benzalkonium, (TRAVATAN) 0.004 % ophthalmic solution  Place 1 drop into both eyes at bedtime. One drop both eyes at bedtim     .  zolpidem (AMBIEN) 10 MG tablet  Take 1 tablet by mouth at bedtime.      No current facility-administered medications for this visit.   Review of Systems  Review of Systems  Constitutional: Negative.  HENT: Negative.  Respiratory: Negative.  Gastrointestinal: Negative.  Neurological: Negative.  All other systems reviewed and are negative.  Blood pressure 116/76, pulse 76, temperature 97.6 F (36.4 C), temperature source Temporal, resp. rate 16, height 5\' 7"  (1.702 m), weight 168 lb (76.204 kg).  Physical Exam  Physical Exam  Constitutional: He is oriented to person, place, and time. He appears well-developed and well-nourished.  HENT:  Head: Normocephalic and atraumatic.  Eyes: Conjunctivae and EOM are normal. Pupils are equal, round, and reactive to light.  Neck: Normal range of motion. Neck supple.  Cardiovascular: Normal rate, regular rhythm and normal heart sounds.  Pulmonary/Chest: Effort normal and breath sounds normal.  Abdominal: Soft. Bowel sounds are normal.  Musculoskeletal: Normal range of motion.  Neurological: He is alert and oriented to person, place, and time.  Data Reviewed  Ultrasound reveals gallstones.   LFTs from within normal limits  Assessment  67 year old male with symptomatic gallstones  Plan  1. We will proceed with a laparoscopic cholecystectomy with intraoperative cholangiogram.  2.All risks and benefits were discussed with the patient to generally include: infection, bleeding, possible need for post op ERCP, damage to the bile ducts, and bile leak. Alternatives were offered and described. All questions were answered and the patient voiced understanding of the procedure and wishes to proceed at this point with a laparoscopic cholecystectomy

## 2013-01-25 NOTE — Anesthesia Postprocedure Evaluation (Signed)
  Anesthesia Post-op Note  Patient: [facility] Butcher [facility]  Procedure(s) Performed: Procedure(s): LAPAROSCOPIC CHOLECYSTECTOMY WITH INTRAOPERATIVE CHOLANGIOGRAM (N/A)  Patient Location: PACU  Anesthesia Type:General  Level of Consciousness: awake  Airway and Oxygen Therapy: Patient Spontanous Breathing  Post-op Pain: mild  Post-op Assessment: Post-op Vital signs reviewed, Patient's Cardiovascular Status Stable, Respiratory Function Stable, Patent Airway, No signs of Nausea or vomiting and Pain level controlled  Post-op Vital Signs: Reviewed and stable  Complications: No apparent anesthesia complications

## 2013-01-25 NOTE — Anesthesia Preprocedure Evaluation (Addendum)
Anesthesia Evaluation  Patient identified by MRN, date of birth, ID band Patient awake    Reviewed: Allergy & Precautions, H&P , NPO status , Patient's Chart, lab work & pertinent test results  Airway Mallampati: II TM Distance: >3 FB Neck ROM: Full    Dental  (+) Teeth Intact and Caps,    Pulmonary shortness of breath,  breath sounds clear to auscultation  Pulmonary exam normal       Cardiovascular hypertension, Pt. on medications and Pt. on home beta blockers + CAD, + Cardiac Stents and + Peripheral Vascular Disease + Valvular Problems/Murmurs AS Rhythm:Regular Rate:Normal + Systolic murmurs    Neuro/Psych negative neurological ROS  negative psych ROS   GI/Hepatic Neg liver ROS, GERD-  Medicated,  Endo/Other  negative endocrine ROS  Renal/GU negative Renal ROS  negative genitourinary   Musculoskeletal negative musculoskeletal ROS (+)   Abdominal Normal abdominal exam  (+)   Peds  Hematology negative hematology ROS (+) anemia ,   Anesthesia Other Findings   Reproductive/Obstetrics negative OB ROS                          Anesthesia Physical Anesthesia Plan  ASA: III  Anesthesia Plan: General   Post-op Pain Management:    Induction: Intravenous  Airway Management Planned: Oral ETT  Additional Equipment:   Intra-op Plan:   Post-operative Plan: Extubation in OR  Informed Consent: I have reviewed the patients History and Physical, chart, labs and discussed the procedure including the risks, benefits and alternatives for the proposed anesthesia with the patient or authorized representative who has indicated his/her understanding and acceptance.     Plan Discussed with: CRNA and Surgeon  Anesthesia Plan Comments:         Anesthesia Quick Evaluation

## 2013-01-25 NOTE — Preoperative (Signed)
Beta Blockers   Reason not to administer Beta Blockers:Took BB @ 2100 12/29 /2014

## 2013-01-25 NOTE — Progress Notes (Signed)
Dr Gypsy Balsam states may give reglan 10 mg for nausea.

## 2013-01-26 ENCOUNTER — Encounter (HOSPITAL_COMMUNITY): Payer: Self-pay | Admitting: General Surgery

## 2013-01-26 NOTE — Discharge Summary (Signed)
Physician Discharge Summary  Patient ID: Robert Estes MRN: [id number] DOB/AGE: 1945/07/26 68 y.o.  Admit date: 01/25/2013 Discharge date: 01/26/2013  Admission Diagnoses:s/p lap chole with n/v  Discharge Diagnoses:  Active Problems:   S/P laparoscopic cholecystectomy   Discharged Condition: good  Hospital Course: Pt admited s/p lap chole due to nausea.  His nausea slowly resolved and he was able to tol PO.  He had good pain control.  He was ambulating on his own.  Consults: None  Significant Diagnostic Studies: none   Treatments: surgery: as above   Discharge Exam: Blood pressure 126/54, pulse 92, temperature 99.6 F (37.6 C), temperature source Oral, resp. rate 20, SpO2 94.00%. General appearance: alert and cooperative GI: s/nt/nd/active BS wounds c/d/i  Disposition: 01-Home or Self Care  Discharge Orders   Future Appointments Provider Department Dept Phone   02/08/2013 4:30 PM Axel Filler, MD [facility], [place] [phone]   04/25/2013 7:40 AM [facility] [place] Office [phone]   Future Orders Complete By Expires   Discharge patient  As directed        Medication List         acetaminophen 500 MG tablet  Commonly known as:  TYLENOL  Take 500 mg by mouth every 6 (six) hours as needed.     aspirin 81 MG tablet  Take 81 mg by mouth daily.     desonide 0.05 % cream  Commonly known as:  DESOWEN  Apply 1 application topically 2 (two) times daily.     hydrOXYzine 10 MG tablet  Commonly known as:  ATARAX/VISTARIL  Take 10 mg by mouth every 4 (four) hours as needed for itching.     Krill Oil 1000 MG Caps  Take 1,000 mg by mouth 2 (two) times daily.     metoprolol succinate 100 MG 24 hr tablet  Commonly known as:  TOPROL-XL  Take 50 mg by mouth at bedtime. Take with or immediately following a meal.     multivitamin with minerals Tabs tablet  Take 1 tablet by mouth daily.     omeprazole 40 MG capsule  Commonly  known as:  PRILOSEC  Take 40 mg by mouth daily.     ondansetron 4 MG disintegrating tablet  Commonly known as:  ZOFRAN ODT  4mg  ODT q4 hours prn nausea/vomit     oxyCODONE-acetaminophen 10-325 MG per tablet  Commonly known as:  PERCOCET  Take 1 tablet by mouth every 4 (four) hours as needed for pain.     pravastatin 40 MG tablet  Commonly known as:  PRAVACHOL  Take 1/4 tablet by mouth daily     tamsulosin 0.4 MG Caps capsule  Commonly known as:  FLOMAX  Take 0.8 mg by mouth daily after supper.     travoprost (benzalkonium) 0.004 % ophthalmic solution  Commonly known as:  TRAVATAN  Place 1 drop into both eyes at bedtime. One drop both eyes at bedtim     zolpidem 10 MG tablet  Commonly known as:  AMBIEN  Take 1 tablet by mouth at bedtime.           Follow-up Information   Follow up with Lajean Saver, MD. Schedule an appointment as soon as possible for a visit in 2 weeks.   Specialty:  General Surgery   Contact information:   [address] [place] [address] [phone]       Signed: Marigene Ehlers., Jed Limerick 01/26/2013, 9:29 AM

## 2013-01-27 ENCOUNTER — Telehealth (INDEPENDENT_AMBULATORY_CARE_PROVIDER_SITE_OTHER): Payer: Self-pay | Admitting: Surgery

## 2013-01-27 NOTE — Telephone Encounter (Signed)
[place]  November 14, 1945 [id number]  Patient Care Team: Ricke Hey, MD as PCP - General (Internal Medicine)  This patient is a 68 y.o.male who calls today for surgical evaluation.   Author: [name], MD Service: Surgery Author Type: Physician Filed: 01/25/2013 12:07 PM Note Time: 01/25/2013 12:01 PM Status: Addendum  PRE-OPERATIVE DIAGNOSIS: SYMPTOMATIC CHOLELITHIASIS  POST-OPERATIVE DIAGNOSIS: SYMPTOMATIC CHOLELITHIASIS  PROCEDURE: Procedure(s):  LAPAROSCOPIC CHOLECYSTECTOMY WITH INTRAOPERATIVE CHOLANGIOGRAM (N/A)  SURGEON: Surgeon(s) and Role:  * [name], MD - Primary   Reason for call: Cough  The patient is POD#2 status post laparoscopic cholecystectomy.  Discharged yesterday.  The patient's son called concerned.  Apparently the patient is having difficulty with a nonproductive cough.  Nose feels blocked up.  It hurts to cough.  He is taking narcotics.  That seems to be controlling his abdominal pain.  He is tolerating food fine.  Having flatus.  Not had a bowel movement yet.  Fevers or chills.  No nausea or vomiting.  He is walking around okay.  I recommend the take a laxative to get his bowels moving.  I recommend he walk an hour a day.  He can try nasal spray to help loosen up his nose.  OTC decongestant.  If that does not help or he worsens, call his primary care physician or go to urgent care center to see if anything else can be done.  Numerous questions were answered.  Recommendations were again made.  The patient's son expressed understanding and appreciation.   Patient Active Problem List   Diagnosis Date Noted  . S/P laparoscopic cholecystectomy 01/25/2013  . Thrombosed external hemorrhoid 03/25/2012    Past Medical History  Diagnosis Date  . Glaucoma     BOTH EYES  . Hypertension   . Enlarged prostate     FLOMAX HELPS PT VOID  . Coronary artery disease     HX OF HEART STENTS X 2   2010--DR. H. SMITH IS PT'S CARDIOLOGIST, MILD AORTIC STENOSIS   . Peripheral vascular disease     CLAUDICATION  . Dizziness   . Hyperlipidemia   . Heart murmur   . GERD (gastroesophageal reflux disease)     OTC PREVACID AS NEEDED  . Hypertension   . Gout   . Exertional shortness of breath   . Iron deficiency anemia     hx  . Migraine     "monthly" (01/25/2013)  . Arthritis     "hands, knees, elbows, fingers" (01/25/2013)    Past Surgical History  Procedure Laterality Date  . Eus  12/24/2011    Procedure: FULL UPPER ENDOSCOPIC ULTRASOUND (EUS) RADIAL;  Surgeon: [name], MD;  Location: [facility];  Service: Endoscopy;  Laterality: N/A;  . Incision and drainage      thrombosed hemorrhoid  . Laparoscopic cholecystectomy  01/25/2013  . Coronary angioplasty with stent placement  11/27/2008    "2" (01/25/2013)  . Cholecystectomy N/A 01/25/2013    Procedure: LAPAROSCOPIC CHOLECYSTECTOMY WITH INTRAOPERATIVE CHOLANGIOGRAM;  Surgeon: [name], MD;  Location: [place];  Service: General;  Laterality: N/A;    History   Social History  . Marital Status: Married    Spouse Name: N/A    Number of Children: N/A  . Years of Education: N/A   Occupational History  . Not on file.   Social History Main Topics  . Smoking status: Never Smoker   . Smokeless tobacco: Never Used  . Alcohol Use: No  . Drug Use: No  . Sexual Activity:  No   Other Topics Concern  . Not on file   Social History Narrative  . No narrative on file    Family History  Problem Relation Age of Onset  . Breast cancer Mother     Current Outpatient Prescriptions  Medication Sig Dispense Refill  . acetaminophen (TYLENOL) 500 MG tablet Take 500 mg by mouth every 6 (six) hours as needed.      Marland Kitchen aspirin 81 MG tablet Take 81 mg by mouth daily.      Marland Kitchen desonide (DESOWEN) 0.05 % cream Apply 1 application topically 2 (two) times daily.       . hydrOXYzine (ATARAX/VISTARIL) 10 MG tablet Take 10 mg by mouth every 4 (four) hours as needed for itching.      Marland Kitchen KRILL OIL  1000 MG CAPS Take 1,000 mg by mouth 2 (two) times daily.      . metoprolol succinate (TOPROL-XL) 100 MG 24 hr tablet Take 50 mg by mouth at bedtime. Take with or immediately following a meal.      . Multiple Vitamin (MULTIVITAMIN WITH MINERALS) TABS Take 1 tablet by mouth daily.      Marland Kitchen omeprazole (PRILOSEC) 40 MG capsule Take 40 mg by mouth daily.      . ondansetron (ZOFRAN ODT) 4 MG disintegrating tablet 4mg  ODT q4 hours prn nausea/vomit  6 tablet  0  . oxyCODONE-acetaminophen (PERCOCET) 10-325 MG per tablet Take 1 tablet by mouth every 4 (four) hours as needed for pain.  30 tablet  0  . pravastatin (PRAVACHOL) 40 MG tablet Take 1/4 tablet by mouth daily  90 tablet  0  . tamsulosin (FLOMAX) 0.4 MG CAPS capsule Take 0.8 mg by mouth daily after supper.       . travoprost, benzalkonium, (TRAVATAN) 0.004 % ophthalmic solution Place 1 drop into both eyes at bedtime. One drop both eyes at bedtim      . zolpidem (AMBIEN) 10 MG tablet Take 1 tablet by mouth at bedtime.       No current facility-administered medications for this visit.     Allergies  Allergen Reactions  . Codeine Itching    @VS @  Dg Chest 2 View  01/13/2013   CLINICAL DATA:  Preop evaluation  EXAM: CHEST  2 VIEW  COMPARISON:  02/05/2012  FINDINGS: Low lung volumes. The heart size and mediastinal contours are within normal limits. Both lungs are clear. The visualized skeletal structures are unremarkable.  IMPRESSION: No active cardiopulmonary disease.   Electronically Signed   By: Margaree Mackintosh M.D.   On: 01/13/2013 13:38   Dg Cholangiogram Operative  01/25/2013   CLINICAL DATA:  Cholelithiasis  EXAM: INTRAOPERATIVE CHOLANGIOGRAM  TECHNIQUE: Cholangiographic images from the C-arm fluoroscopic device were submitted for interpretation post-operatively. Please see the procedural report for the amount of contrast and the fluoroscopy time utilized.  COMPARISON:  None.  FINDINGS: There is some extravasation of contrast material into the  peritoneal cavity. No persistent filling defects in the common duct. Intrahepatic ducts are incompletely visualized, appearing decompressed centrally. Contrast passes into the duodenum.  : Negative for retained common duct stone.   Electronically Signed   By: Arne Cleveland M.D.   On: 01/25/2013 13:24    Note: This dictation was prepared with Dragon/digital dictation along with [employer]. Any transcriptional errors that result from this process are unintentional.

## 2013-02-02 ENCOUNTER — Telehealth (INDEPENDENT_AMBULATORY_CARE_PROVIDER_SITE_OTHER): Payer: Self-pay

## 2013-02-02 NOTE — Telephone Encounter (Signed)
Pt was advised of low fat diet.

## 2013-02-02 NOTE — Telephone Encounter (Signed)
Pt called stating he is having nausea but no vomiting. Had BM yesterday. No BM or flatus today. Slight bloating. Pt states he has been eating liver pudding,mayo,snickers,ranch dsg and lettuce. No fever. Voiding well without complaint. Pt advised to go to clear liquids for 24 hours and stop narcotic pain med. Pt advised to be up walking to encourage improved bowel habits and flatus. Pt advised if nausea does not resolve or if vomiting occurs he is to call and let us know. Pt advised I will send this also to Dr Rosendo Gros for review.

## 2013-02-02 NOTE — Telephone Encounter (Signed)
Doesn't sound liek his chosen diet is helping his nausea.  I'd have him stay on a low fat, bland diet.

## 2013-02-08 ENCOUNTER — Encounter (INDEPENDENT_AMBULATORY_CARE_PROVIDER_SITE_OTHER): Payer: Self-pay | Admitting: General Surgery

## 2013-02-08 ENCOUNTER — Ambulatory Visit (INDEPENDENT_AMBULATORY_CARE_PROVIDER_SITE_OTHER): Payer: Medicare Other | Admitting: General Surgery

## 2013-02-08 VITALS — BP 118/60 | HR 78 | Temp 96.5°F | Resp 16 | Ht 66.0 in | Wt 162.6 lb

## 2013-02-08 DIAGNOSIS — Z9889 Other specified postprocedural states: Secondary | ICD-10-CM

## 2013-02-08 DIAGNOSIS — Z9049 Acquired absence of other specified parts of digestive tract: Secondary | ICD-10-CM

## 2013-02-08 NOTE — Progress Notes (Signed)
Patient ID: OWAIS PRUETT, male   DOB: 06-01-1945, 68 y.o.   MRN: [id number] Post op course Patient is a 68 year old male status post laparoscopic cholecystectomy. Patient has been doing well postoperatively.  On Exam: Wounds are clean dry and intact, there's no hematoma.  Pathology:   Revealed chronic cholecystitis and cholelithiasis.  This was discussed with the patient.  Assessment and Plan 68 year old male status post laparoscopic cholecystectomy 1. The patient can resume normal activity. 2. I have discussed the patient should continue with his nausea to call back for possible further workup or medication. 3. Patient follow up as needed   [name], MD [facility] General & Minimally Invasive Surgery Trauma & Emergency Surgery

## 2013-02-08 NOTE — Addendum Note (Signed)
Addended by: Ivor Costa on: 02/08/2013 05:01 PM   Modules accepted: Orders

## 2013-04-25 ENCOUNTER — Other Ambulatory Visit: Payer: No Typology Code available for payment source

## 2013-05-17 ENCOUNTER — Encounter: Payer: Medicare Other | Attending: Interventional Cardiology | Admitting: Dietician

## 2013-05-17 DIAGNOSIS — R7989 Other specified abnormal findings of blood chemistry: Secondary | ICD-10-CM | POA: Insufficient documentation

## 2013-05-17 DIAGNOSIS — E785 Hyperlipidemia, unspecified: Secondary | ICD-10-CM | POA: Insufficient documentation

## 2013-05-17 DIAGNOSIS — R5383 Other fatigue: Secondary | ICD-10-CM

## 2013-05-17 DIAGNOSIS — R5381 Other malaise: Secondary | ICD-10-CM | POA: Insufficient documentation

## 2013-05-17 DIAGNOSIS — Z713 Dietary counseling and surveillance: Secondary | ICD-10-CM | POA: Insufficient documentation

## 2013-05-17 NOTE — Progress Notes (Signed)
  Medical Nutrition Therapy:  Appt start time: 5009 end time:  1715.   Assessment: Robert Estes is here today to learn more about a healthy diet and lifestyle. He has hyperlipidemia and low iron levels following a cholecystectomy 3 months ago. He has several questions and seems interested in making changes. He does Dentist work. He reports having difficulty stabilizing his bowels since his cholecystectomy. He also has difficulty sleeping at night and reports fatigue the following day.  Preferred Learning Style:  No preference indicated   Learning Readiness:  Ready  MEDICATIONS: see list   DIETARY INTAKE:  Avoided foods include chili beans, black beans, and lactose.    24-hr recall:  B ( AM): oatmeal, egg white, liver pudding  Snk ( AM):  L ( PM): [employer] grilled chicken, fries, and soda  Snk ( PM): D ( PM): Ginger ale, [place] sandwich and vegetables and beans Snk ( PM): Lactaid ice cream or ginger snap cookies or cake  Beverages: a little wine, regular soda, orange juice  Usual physical activity: "not enough"  Estimated energy needs: 2000-2200 calories 225-248 g carbohydrates 150-165 g protein 56-61 g fat  Progress Towards Goal(s):  No progress.   Nutritional Diagnosis:  NB-1.1 Food and nutrition-related knowledge deficit As related to lack of education regarding hyperlipidemia and blood glucose As evidenced by patient report of limited knowledge of nutrition-related recommendations and inappropriate dietary choices.    Intervention:  Nutrition counseling and education provided. Goals: -Increase exercise (aim for at least 30 minutes 3 days a week) -Talk to doctor about sleeping problems and anxiety -Fruit: choose fresh fruit or fruit canned in water or it's own juice -Fill up on non-starchy vegetables (any veggie besides corn, peas, and potatoes) -Rinse canned vegetables (fresh and frozen are good :) ) -Choose lean cuts of meat: lean pork, lean beef, chicken,  fish, [place]  -grill, steam, broil, bake, saute  -Healthy fats (watch portions!): avocado, nuts, fish, olive oil  -Limit portions of Ranch dressing -Use pepper, other salt-free seasonings, and herbs to flavor foods  -Increase dietary fiber: non-starchy vegetables -Aim for 64 oz of water a day  Teaching Method Utilized: Visual Auditory Hands on  Handouts given during visit include:  List of High Fiber Foods  Cholesterol and Triglycerides  Heart-Healthy Eating: Fiber Tips  Nutrition Label  MyPlate  Barriers to learning/adherence to lifestyle change: knowledge deficit  Demonstrated degree of understanding via:  Teach Back   Monitoring/Evaluation:  Dietary intake, exercise, and body weight prn.

## 2013-05-17 NOTE — Patient Instructions (Addendum)
-  Increase exercise (aim for at least 30 minutes 3 days a week) -Talk to doctor about sleeping problems and anxiety -Fruit: choose fresh fruit or fruit canned in water or it's own juice -Fill up on non-starchy vegetables (any veggie besides corn, peas, and potatoes) -Rinse canned vegetables (fresh and frozen are good :) ) -Choose lean cuts of meat: lean pork, lean beef, chicken, fish, [place]  -grill, steam, broil, bake, saute  -Healthy fats (watch portions!): avocado, nuts, fish, olive oil  -Limit portions of Ranch dressing -Use pepper, other salt-free seasonings, and herbs to flavor foods  -Increase dietary fiber: non-starchy vegetables -Aim for 64 oz of water a day

## 2013-05-18 ENCOUNTER — Encounter: Payer: Self-pay | Admitting: Dietician

## 2013-06-20 ENCOUNTER — Ambulatory Visit: Payer: Medicare Other

## 2013-06-20 ENCOUNTER — Ambulatory Visit (INDEPENDENT_AMBULATORY_CARE_PROVIDER_SITE_OTHER): Payer: Medicare Other | Admitting: Family Medicine

## 2013-06-20 VITALS — BP 96/54 | HR 90 | Temp 98.9°F | Resp 18 | Ht 66.5 in | Wt 159.0 lb

## 2013-06-20 DIAGNOSIS — R509 Fever, unspecified: Secondary | ICD-10-CM

## 2013-06-20 DIAGNOSIS — R5381 Other malaise: Secondary | ICD-10-CM

## 2013-06-20 DIAGNOSIS — R05 Cough: Secondary | ICD-10-CM

## 2013-06-20 DIAGNOSIS — R059 Cough, unspecified: Secondary | ICD-10-CM

## 2013-06-20 DIAGNOSIS — R011 Cardiac murmur, unspecified: Secondary | ICD-10-CM

## 2013-06-20 DIAGNOSIS — J329 Chronic sinusitis, unspecified: Secondary | ICD-10-CM

## 2013-06-20 MED ORDER — PREDNISONE 20 MG PO TABS: 40.0000 mg | ORAL_TABLET | Freq: Every day | ORAL | Status: DC

## 2013-06-20 MED ORDER — AZITHROMYCIN 250 MG PO TABS: ORAL_TABLET | ORAL | Status: DC

## 2013-06-20 NOTE — Patient Instructions (Signed)

## 2013-06-20 NOTE — Progress Notes (Addendum)
Subjective:    Patient ID: Robert Estes, male    DOB: [birth year], 68 y.o.   MRN: [id number]  Cough Associated symptoms include headaches. Pertinent negatives include no chills or fever.  Sinusitis Associated symptoms include congestion, coughing, headaches and sinus pressure. Pertinent negatives include no chills.   Chief Complaint  Patient presents with   Cough    3 days   Sinusitis    mucus- green   This chart was scribed for [name], MD by Thea Alken, ED Scribe. This patient was seen in room 4 and the patient's care was started at 1:02 PM.  HPI Comments: Robert Estes is a 68 y.o. male who presents to the [facility] complaining of cold onset 3 days with associated hacking cough, HA, nasal congestion and chest congestion. Pt reports diarrhea this morning. Pt reports he initially had a sore throat but has subsided. Pt has taken OTC medication in which he believes worsened his symptoms. Pt denies fever. Pt is not a smoker  Cardiologist- Dr. Tamala Julian  Past Medical History  Diagnosis Date   Glaucoma     BOTH EYES   Hypertension    Enlarged prostate     FLOMAX HELPS PT VOID   Coronary artery disease     HX OF HEART STENTS X 2   2010--DR. Linard Millers IS PT'S CARDIOLOGIST, MILD AORTIC STENOSIS   Peripheral vascular disease     CLAUDICATION   Dizziness    Hyperlipidemia    Heart murmur    GERD (gastroesophageal reflux disease)     OTC PREVACID AS NEEDED   Hypertension    Gout    Exertional shortness of breath    Iron deficiency anemia     hx   Migraine     "monthly" (01/25/2013)   Arthritis     "hands, knees, elbows, fingers" (01/25/2013)   Allergies  Allergen Reactions   Codeine Itching   Prior to Admission medications   Medication Sig Start Date End Date Taking? Authorizing Provider  acetaminophen (TYLENOL) 500 MG tablet Take 500 mg by mouth every 6 (six) hours as needed.   Yes Historical Provider, MD  aspirin 81 MG  tablet Take 81 mg by mouth daily.   Yes Historical Provider, MD  desonide (DESOWEN) 0.05 % cream Apply 1 application topically 2 (two) times daily.  12/01/12  Yes Historical Provider, MD  KRILL OIL 1000 MG CAPS Take 1,000 mg by mouth 2 (two) times daily.   Yes Historical Provider, MD  metoprolol succinate (TOPROL-XL) 100 MG 24 hr tablet Take 50 mg by mouth at bedtime. Take with or immediately following a meal.   Yes Historical Provider, MD  Multiple Vitamin (MULTIVITAMIN WITH MINERALS) TABS Take 1 tablet by mouth daily.   Yes Historical Provider, MD  omeprazole (PRILOSEC) 40 MG capsule Take 40 mg by mouth daily.   Yes Historical Provider, MD  ondansetron (ZOFRAN ODT) 4 MG disintegrating tablet 4mg  ODT q4 hours prn nausea/vomit 12/16/12  Yes Mariea Clonts, MD  oxyCODONE-acetaminophen (PERCOCET) 10-325 MG per tablet Take 1 tablet by mouth every 4 (four) hours as needed for pain. 01/25/13  Yes [name], MD  pravastatin (PRAVACHOL) 40 MG tablet Take 1/4 tablet by mouth daily 10/29/12  Yes Belva Crome III, MD  travoprost, benzalkonium, (TRAVATAN) 0.004 % ophthalmic solution Place 1 drop into both eyes at bedtime. One drop both eyes at bedtim   Yes Historical Provider, MD  zolpidem (AMBIEN) 10 MG tablet  Take 1 tablet by mouth at bedtime. 12/13/12  Yes Historical Provider, MD   Review of Systems  Constitutional: Negative for fever and chills.  HENT: Positive for congestion and sinus pressure.   Respiratory: Positive for cough.   Gastrointestinal: Positive for diarrhea.  Neurological: Positive for headaches.      Objective:   Physical Exam  Nursing note and vitals reviewed. Constitutional: He is oriented to person, place, and time. He appears well-developed and well-nourished. No distress.  HENT:  Head: Normocephalic and atraumatic.  Eyes: EOM are normal.  Neck: Neck supple. No tracheal deviation present.  Cardiovascular: Normal rate and regular rhythm.   Murmur heard.  Systolic murmur  is present with a grade of 2/6  Pulmonary/Chest: Effort normal. He has wheezes ( expiratory).  Musculoskeletal: Normal range of motion.  Neurological: He is alert and oriented to person, place, and time.  Skin: Skin is warm and dry.  Psychiatric: He has a normal mood and affect. His behavior is normal.  [facility] reading (PRIMARY) by  Dr. Joseph Art:  No definite infiltrate.      Assessment & Plan:   1. Cough   2. Malaise   3. Fever, unspecified       Cough - Plan: DG Chest 2 View, azithromycin (ZITHROMAX Z-PAK) 250 MG tablet, predniSONE (DELTASONE) 20 MG tablet  Malaise - Plan: DG Chest 2 View  Fever, unspecified - Plan: DG Chest 2 View  Heart murmur  Signed, [name], MD

## 2013-06-21 ENCOUNTER — Telehealth: Payer: Self-pay

## 2013-06-21 NOTE — Telephone Encounter (Signed)
PATIENT STATES DR L MENTION A COUGH MEDICATION DURING VISIT, HOWEVER, THERE WAS NOTHING FOR COUGH CALLED IN TO THE PHARMACY.  ALSO WANTS TO PERSONALL THAT DR L FOR HIS PATIENCE!!!!

## 2013-06-22 MED ORDER — BENZONATATE 100 MG PO CAPS: 100.0000 mg | ORAL_CAPSULE | Freq: Three times a day (TID) | ORAL | Status: DC | PRN

## 2013-06-22 NOTE — Telephone Encounter (Signed)
Tessalon Perles sent to pharmacy

## 2013-06-23 NOTE — Telephone Encounter (Signed)
Advised pt

## 2013-07-02 ENCOUNTER — Ambulatory Visit (INDEPENDENT_AMBULATORY_CARE_PROVIDER_SITE_OTHER): Payer: Medicare Other | Admitting: Family Medicine

## 2013-07-02 VITALS — BP 130/60 | HR 64 | Temp 99.1°F | Resp 16 | Ht 66.0 in | Wt 165.0 lb

## 2013-07-02 DIAGNOSIS — R05 Cough: Secondary | ICD-10-CM

## 2013-07-02 DIAGNOSIS — R059 Cough, unspecified: Secondary | ICD-10-CM

## 2013-07-02 DIAGNOSIS — J029 Acute pharyngitis, unspecified: Secondary | ICD-10-CM

## 2013-07-02 MED ORDER — PREDNISONE 20 MG PO TABS: 40.0000 mg | ORAL_TABLET | Freq: Every day | ORAL | Status: DC

## 2013-07-02 MED ORDER — AMOXICILLIN-POT CLAVULANATE 875-125 MG PO TABS: 1.0000 | ORAL_TABLET | Freq: Two times a day (BID) | ORAL | Status: DC

## 2013-07-02 NOTE — Progress Notes (Signed)
Patient ID: Robert Estes [facility] MRN: [id number], DOB: 1945/08/14, 68 y.o. Date of Encounter: 07/02/2013, 8:58 AM  Primary Physician: Ricke Hey, MD  Chief Complaint:  Chief Complaint  Patient presents with  . Sinus Problem    drainage, sore throat, congestion x 2 days    HPI: 68 y.o. year old male presents with 2 day history of sore throat. Subjective fever and chills. No cough, congestion, rhinorrhea, sinus pressure, otalgia, or headache. Normal hearing. No GI complaints. Able to swallow saliva, but hurts to do so. Decreased appetite secondary to sore throat.   Past Medical History  Diagnosis Date  . Glaucoma     BOTH EYES  . Hypertension   . Enlarged prostate     FLOMAX HELPS PT VOID  . Coronary artery disease     HX OF HEART STENTS X 2   2010--DR. H. SMITH IS PT'S CARDIOLOGIST, MILD AORTIC STENOSIS  . Peripheral vascular disease     CLAUDICATION  . Dizziness   . Hyperlipidemia   . Heart murmur   . GERD (gastroesophageal reflux disease)     OTC PREVACID AS NEEDED  . Hypertension   . Gout   . Exertional shortness of breath   . Iron deficiency anemia     hx  . Migraine     "monthly" (01/25/2013)  . Arthritis     "hands, knees, elbows, fingers" (01/25/2013)     Home Meds: Prior to Admission medications   Medication Sig Start Date End Date Taking? Authorizing Provider  acetaminophen (TYLENOL) 500 MG tablet Take 500 mg by mouth every 6 (six) hours as needed.   Yes Historical Provider, MD  aspirin 81 MG tablet Take 81 mg by mouth daily.   Yes Historical Provider, MD  benzonatate (TESSALON) 100 MG capsule Take 1-2 capsules (100-200 mg total) by mouth 3 (three) times daily as needed for cough. 06/22/13  Yes Eleanore E Egan, PA-C  desonide (DESOWEN) 0.05 % cream Apply 1 application topically 2 (two) times daily.  12/01/12  Yes Historical Provider, MD  KRILL OIL 1000 MG CAPS Take 1,000 mg by mouth 2 (two) times daily.   Yes Historical Provider, MD  metoprolol  succinate (TOPROL-XL) 100 MG 24 hr tablet Take 50 mg by mouth at bedtime. Take with or immediately following a meal.   Yes Historical Provider, MD  Multiple Vitamin (MULTIVITAMIN WITH MINERALS) TABS Take 1 tablet by mouth daily.   Yes Historical Provider, MD  omeprazole (PRILOSEC) 40 MG capsule Take 40 mg by mouth daily.   Yes Historical Provider, MD  ondansetron (ZOFRAN ODT) 4 MG disintegrating tablet 4mg  ODT q4 hours prn nausea/vomit 12/16/12  Yes Mariea Clonts, MD  oxyCODONE-acetaminophen (PERCOCET) 10-325 MG per tablet Take 1 tablet by mouth every 4 (four) hours as needed for pain. 01/25/13  Yes [name], MD  pravastatin (PRAVACHOL) 40 MG tablet Take 1/4 tablet by mouth daily 10/29/12  Yes Belva Crome III, MD  predniSONE (DELTASONE) 20 MG tablet Take 2 tablets (40 mg total) by mouth daily. 07/02/13  Yes [name], MD  travoprost, benzalkonium, (TRAVATAN) 0.004 % ophthalmic solution Place 1 drop into both eyes at bedtime. One drop both eyes at bedtim   Yes Historical Provider, MD  zolpidem (AMBIEN) 10 MG tablet Take 1 tablet by mouth at bedtime. 12/13/12  Yes Historical Provider, MD  amoxicillin-clavulanate (AUGMENTIN) 875-125 MG per tablet Take 1 tablet by mouth 2 (two) times daily. 07/02/13   [name], MD  azithromycin (ZITHROMAX Z-PAK) 250 MG tablet Take as directed on pack 06/20/13   [name], MD    Allergies:  Allergies  Allergen Reactions  . Codeine Itching    History   Social History  . Marital Status: Married    Spouse Name: N/A    Number of Children: N/A  . Years of Education: N/A   Occupational History  . Not on file.   Social History Main Topics  . Smoking status: Never Smoker   . Smokeless tobacco: Never Used  . Alcohol Use: No  . Drug Use: No  . Sexual Activity: No   Other Topics Concern  . Not on file   Social History Narrative  . No narrative on file     Review of Systems: Constitutional: negative for chills, fever, night sweats  or weight changes HEENT: see above Cardiovascular: negative for chest pain or palpitations Respiratory: negative for hemoptysis, wheezing, or shortness of breath Abdominal: negative for abdominal pain, nausea, vomiting or diarrhea Dermatological: negative for rash Neurologic: negative for headache   Physical Exam: Blood pressure 130/60, pulse 64, temperature 99.1 F (37.3 C), temperature source Oral, resp. rate 16, height 5\' 6"  (1.676 m), weight 165 lb (74.844 kg), SpO2 99.00%., Body mass index is 26.64 kg/(m^2). General: Well developed, well nourished, in no acute distress. Head: Normocephalic, atraumatic, eyes without discharge, sclera non-icteric, nares are patent. Bilateral auditory canals clear, TM's are without perforation, pearly grey with reflective cone of light bilaterally. No sinus TTP. Oral cavity moist, dentition normal. Posterior pharynx with post nasal drip and mild erythema. No peritonsillar abscess or tonsillar exudate. Neck: Supple. No thyromegaly. Full ROM. No lymphadenopathy. Lungs: Clear bilaterally to auscultation without wheezes, rales, or rhonchi. Breathing is unlabored. Heart: RRR with S1 S2. No murmurs, rubs, or gallops appreciated. Abdomen: Soft, non-tender, non-distended with normoactive bowel sounds. No hepatomegaly. No rebound/guarding. No obvious abdominal masses. Msk:  Strength and tone normal for age. Extremities: No clubbing or cyanosis. No edema. Neuro: Alert and oriented X 3. Moves all extremities spontaneously. CNII-XII grossly in tact. Psych:  Responds to questions appropriately with a normal affect.     ASSESSMENT AND PLAN:  68 y.o. year old male with acute pharyngitis Cough - Plan: predniSONE (DELTASONE) 20 MG tablet  Sore throat - Plan: amoxicillin-clavulanate (AUGMENTIN) 875-125 MG per tablet, Culture, Group A Strep   - -Tylenol/Motrin prn -Rest/fluids -RTC precautions -RTC 3-5 days if no improvement  Signed, [name],  MD 07/02/2013 8:58 AM

## 2013-07-04 LAB — CULTURE, GROUP A STREP: Organism ID, Bacteria: NORMAL

## 2013-07-05 NOTE — Telephone Encounter (Signed)
Pt would like to go ahead with the referral to allergiest

## 2013-07-11 ENCOUNTER — Other Ambulatory Visit: Payer: Self-pay | Admitting: Family Medicine

## 2013-07-20 ENCOUNTER — Encounter: Payer: Self-pay | Admitting: Interventional Cardiology

## 2013-07-20 DIAGNOSIS — E785 Hyperlipidemia, unspecified: Secondary | ICD-10-CM | POA: Insufficient documentation

## 2013-07-20 DIAGNOSIS — K219 Gastro-esophageal reflux disease without esophagitis: Secondary | ICD-10-CM | POA: Insufficient documentation

## 2013-07-20 DIAGNOSIS — H409 Unspecified glaucoma: Secondary | ICD-10-CM | POA: Insufficient documentation

## 2013-07-20 DIAGNOSIS — N400 Enlarged prostate without lower urinary tract symptoms: Secondary | ICD-10-CM | POA: Insufficient documentation

## 2013-07-20 DIAGNOSIS — G43909 Migraine, unspecified, not intractable, without status migrainosus: Secondary | ICD-10-CM | POA: Insufficient documentation

## 2013-07-20 DIAGNOSIS — M109 Gout, unspecified: Secondary | ICD-10-CM | POA: Insufficient documentation

## 2013-07-20 DIAGNOSIS — R0602 Shortness of breath: Secondary | ICD-10-CM | POA: Insufficient documentation

## 2013-07-20 DIAGNOSIS — R42 Dizziness and giddiness: Secondary | ICD-10-CM | POA: Insufficient documentation

## 2013-07-20 DIAGNOSIS — I10 Essential (primary) hypertension: Secondary | ICD-10-CM | POA: Insufficient documentation

## 2013-07-20 DIAGNOSIS — I2510 Atherosclerotic heart disease of native coronary artery without angina pectoris: Secondary | ICD-10-CM | POA: Insufficient documentation

## 2013-07-20 DIAGNOSIS — M1990 Unspecified osteoarthritis, unspecified site: Secondary | ICD-10-CM | POA: Insufficient documentation

## 2013-07-20 DIAGNOSIS — D509 Iron deficiency anemia, unspecified: Secondary | ICD-10-CM | POA: Insufficient documentation

## 2013-07-20 DIAGNOSIS — I739 Peripheral vascular disease, unspecified: Secondary | ICD-10-CM | POA: Insufficient documentation

## 2013-08-26 ENCOUNTER — Telehealth: Payer: Self-pay | Admitting: Interventional Cardiology

## 2013-08-26 DIAGNOSIS — E785 Hyperlipidemia, unspecified: Secondary | ICD-10-CM

## 2013-08-26 NOTE — Telephone Encounter (Signed)
Pt calls today b/c he continues to have lower leg cramps even after stopping his cholesterol medication a month ago. He would like to know what other cholesterol medication to take & what to do about the cramps.   States he had cramps in the posterior thighs while on the cholesterol medication. After stopping it, he has been having lower leg cramps while in bed. These seem to get better with a heating pad & mustard pack.  Denies any dizziness. He has been working all along. Still taking the liquid krill oil with juice.  Forwarded to Dr. Tamala Julian for review. Horton Chin RN

## 2013-08-26 NOTE — Telephone Encounter (Signed)
New message     Can't take cholesterol medication any more---it causes bad muscle cramps.  What else can he take?--OK to call Monday.

## 2013-08-27 NOTE — Telephone Encounter (Signed)
Is cramping exertional, ie claudication? How are his legs with walking? I don't know what to do about cramps. He should speak to PCP. He should also resume his typicalstatin

## 2013-09-02 NOTE — Telephone Encounter (Signed)
pt sts that he has had some improvement with muscle cramping since holding statin. pt sts that he does have muscle cramping when walking. pt given Dr.Smith instructions to resume statin therapy.pt is concerned that the muscle cramps will get worse as they were when he was on them before. Pt would like to know if Dr.Smith would recommend  reducing his dose of Pravastatin or trying a different anti lipid med.adv pt per Dr.Smith to f/u with his pcp regarding caramps. Adv pt I will fwd a message to [place] regarding a lowering pt dose of statin med or switching and call back with his response. Pt verbalized understanding

## 2013-09-07 NOTE — Telephone Encounter (Signed)
Please start Crestor 5 mg on Monday and Thursday of each week. Statin panel in 8 weeks./

## 2013-09-08 MED ORDER — ROSUVASTATIN CALCIUM 5 MG PO TABS: ORAL_TABLET | ORAL | Status: DC

## 2013-09-08 NOTE — Telephone Encounter (Signed)
pt given Dr.Smith recommendations.Please start Crestor 5 mg on Monday and Thursday of each week. Statin panel in 8 weeks.pt verbalized understanding and will call back to sch.

## 2013-09-08 NOTE — Addendum Note (Signed)
Addended by: Lamar Laundry on: 09/08/2013 04:56 PM   Modules accepted: Orders, Medications

## 2013-11-15 ENCOUNTER — Other Ambulatory Visit: Payer: Self-pay

## 2013-11-15 DIAGNOSIS — E785 Hyperlipidemia, unspecified: Secondary | ICD-10-CM

## 2013-11-15 MED ORDER — ROSUVASTATIN CALCIUM 5 MG PO TABS: ORAL_TABLET | ORAL | Status: DC

## 2013-11-17 ENCOUNTER — Other Ambulatory Visit: Payer: Self-pay | Admitting: *Deleted

## 2013-11-17 ENCOUNTER — Other Ambulatory Visit: Payer: Self-pay | Admitting: Interventional Cardiology

## 2013-11-17 MED ORDER — METOPROLOL SUCCINATE ER 50 MG PO TB24: 50.0000 mg | ORAL_TABLET | Freq: Every day | ORAL | Status: DC

## 2013-12-20 ENCOUNTER — Other Ambulatory Visit: Payer: Self-pay | Admitting: Interventional Cardiology

## 2013-12-21 ENCOUNTER — Other Ambulatory Visit (INDEPENDENT_AMBULATORY_CARE_PROVIDER_SITE_OTHER): Payer: Medicare Other | Admitting: *Deleted

## 2013-12-21 ENCOUNTER — Other Ambulatory Visit: Payer: Self-pay | Admitting: *Deleted

## 2013-12-21 DIAGNOSIS — E785 Hyperlipidemia, unspecified: Secondary | ICD-10-CM

## 2013-12-21 LAB — LIPID PANEL
Cholesterol: 179 mg/dL (ref 0–200)
HDL: 24.6 mg/dL — ABNORMAL LOW (ref >39.00)
LDL Cholesterol: 129 mg/dL — ABNORMAL HIGH (ref 0–99)
NonHDL: 154.4
Total CHOL/HDL Ratio: 7
Triglycerides: 128 mg/dL (ref 0.0–149.0)
VLDL: 25.6 mg/dL (ref 0.0–40.0)

## 2013-12-21 LAB — ALT: ALT: 36 U/L (ref 0–53)

## 2013-12-21 MED ORDER — METOPROLOL SUCCINATE ER 50 MG PO TB24: 50.0000 mg | ORAL_TABLET | Freq: Every day | ORAL | Status: DC

## 2013-12-29 ENCOUNTER — Telehealth: Payer: Self-pay

## 2013-12-29 DIAGNOSIS — E785 Hyperlipidemia, unspecified: Secondary | ICD-10-CM

## 2013-12-29 NOTE — Telephone Encounter (Signed)
Pt aware of lab results.Bad cholesterol is too high. Crestor dose is not getting to goal. Is he on low fat diet? Have we tried a higher dose and had symptoms/SE? Pt reports that he has been been trying to watch what he eats. Pt counseled on foods to avoid that are high in fat.pt sts that his Crestor dosage was cut back because of muscle pain, pt believes it was cut back to much and is willing to increase his dosage. Adv pt I will fwd an update to Dr.Smith and call back with his recommendation.pt agreeable

## 2013-12-29 NOTE — Telephone Encounter (Signed)
left message with pt wife for pt to call back

## 2013-12-29 NOTE — Telephone Encounter (Signed)
-----   Message from Sinclair Grooms, MD sent at 12/21/2013  4:37 PM EST ----- Bad cholesterol is too high. Crestor dose is not getting to goal. Is he on low fat diet? Have we tried a higher dose and had symptoms/SE?

## 2013-12-29 NOTE — Telephone Encounter (Signed)
Follow Up ° °Pt called back  °

## 2014-01-02 MED ORDER — ROSUVASTATIN CALCIUM 5 MG PO TABS: 5.0000 mg | ORAL_TABLET | Freq: Every day | ORAL | Status: DC

## 2014-01-02 NOTE — Telephone Encounter (Signed)
Pt aware of Dr.Smith's recommendations.Take Crestor by mouth daily. If  unable to tolerate, we will referr to the lipid clinic to be considered for PSK 9. If he increases the dose of Crestor and tolerates it for 6-8 weeks, repeat lipid panel and liver panel. Lab appt scheduled for 03/06/14. Rx for Crestor 5mg  daily sent to pt pharmacy.pt verbalized understanding.

## 2014-01-05 ENCOUNTER — Telehealth: Payer: Self-pay

## 2014-01-05 DIAGNOSIS — E785 Hyperlipidemia, unspecified: Secondary | ICD-10-CM

## 2014-01-05 MED ORDER — ROSUVASTATIN CALCIUM 5 MG PO TABS: 5.0000 mg | ORAL_TABLET | Freq: Every day | ORAL | Status: DC

## 2014-01-05 NOTE — Telephone Encounter (Signed)
Pt wife aware samples of Crestor and discount card left at the front desk for pick

## 2014-01-06 ENCOUNTER — Telehealth: Payer: Self-pay | Admitting: Interventional Cardiology

## 2014-01-06 NOTE — Telephone Encounter (Signed)
New Msg  Someone calling for pt, pt wants to thank you for items at front desk.

## 2014-01-31 ENCOUNTER — Other Ambulatory Visit: Payer: Self-pay | Admitting: Interventional Cardiology

## 2014-02-01 NOTE — Telephone Encounter (Signed)
Please advise on refill, patient still has not scheduled an appointment. Thanks, MI

## 2014-02-02 ENCOUNTER — Other Ambulatory Visit: Payer: Self-pay

## 2014-02-02 ENCOUNTER — Telehealth: Payer: Self-pay

## 2014-02-02 MED ORDER — METOPROLOL SUCCINATE ER 50 MG PO TB24: 50.0000 mg | ORAL_TABLET | Freq: Every day | ORAL | Status: DC

## 2014-02-03 NOTE — Telephone Encounter (Signed)
refill 

## 2014-02-06 NOTE — Telephone Encounter (Signed)
Letter mailed for pt to call to schedule an appt with

## 2014-03-06 ENCOUNTER — Other Ambulatory Visit: Payer: Medicare Other

## 2014-03-08 ENCOUNTER — Other Ambulatory Visit (INDEPENDENT_AMBULATORY_CARE_PROVIDER_SITE_OTHER): Payer: Medicare Other | Admitting: *Deleted

## 2014-03-08 DIAGNOSIS — E785 Hyperlipidemia, unspecified: Secondary | ICD-10-CM

## 2014-03-08 LAB — LIPID PANEL
Cholesterol: 151 mg/dL (ref 0–200)
HDL: 28.4 mg/dL — ABNORMAL LOW (ref >39.00)
LDL Cholesterol: 100 mg/dL — ABNORMAL HIGH (ref 0–99)
NonHDL: 122.6
Total CHOL/HDL Ratio: 5
Triglycerides: 114 mg/dL (ref 0.0–149.0)
VLDL: 22.8 mg/dL (ref 0.0–40.0)

## 2014-03-08 LAB — ALT: ALT: 70 U/L — ABNORMAL HIGH (ref 0–53)

## 2014-03-10 ENCOUNTER — Ambulatory Visit (INDEPENDENT_AMBULATORY_CARE_PROVIDER_SITE_OTHER): Payer: Medicare Other | Admitting: Interventional Cardiology

## 2014-03-10 ENCOUNTER — Encounter: Payer: Self-pay | Admitting: Interventional Cardiology

## 2014-03-10 VITALS — BP 115/64 | HR 62 | Ht 66.0 in | Wt 161.0 lb

## 2014-03-10 DIAGNOSIS — I2510 Atherosclerotic heart disease of native coronary artery without angina pectoris: Secondary | ICD-10-CM

## 2014-03-10 DIAGNOSIS — E785 Hyperlipidemia, unspecified: Secondary | ICD-10-CM

## 2014-03-10 DIAGNOSIS — R0989 Other specified symptoms and signs involving the circulatory and respiratory systems: Secondary | ICD-10-CM | POA: Insufficient documentation

## 2014-03-10 DIAGNOSIS — I350 Nonrheumatic aortic (valve) stenosis: Secondary | ICD-10-CM

## 2014-03-10 DIAGNOSIS — I739 Peripheral vascular disease, unspecified: Secondary | ICD-10-CM

## 2014-03-10 DIAGNOSIS — I10 Essential (primary) hypertension: Secondary | ICD-10-CM

## 2014-03-10 NOTE — Progress Notes (Signed)
Cardiology Office Note   Date:  03/10/2014   ID:  [place], [place] October 18, 1945, MRN [id number]  PCP:  Ricke Hey, MD  Cardiologist:   Sinclair Grooms, MD   Chief Complaint  Patient presents with  . Leg Pain      History of Present Illness: Robert Estes is a 69 y.o. male who presents for CAD. Has some lower extremity discomfort with exertion. He is also concerned about blood flow to his brain. He has not needed to use sublingual nitroglycerin. He denies dyspnea. He has not had syncope. Appetite has been stable.    Past Medical History  Diagnosis Date  . Glaucoma     BOTH EYES  . Hypertension   . Enlarged prostate     FLOMAX HELPS PT VOID  . Coronary artery disease     HX OF HEART STENTS X 2   2010--DR. H. SMITH IS PT'S CARDIOLOGIST, MILD AORTIC STENOSIS  . Peripheral vascular disease     CLAUDICATION  . Dizziness   . Hyperlipidemia   . Heart murmur   . GERD (gastroesophageal reflux disease)     OTC PREVACID AS NEEDED  . Hypertension   . Gout   . Exertional shortness of breath   . Iron deficiency anemia     hx  . Migraine     "monthly" (01/25/2013)  . Arthritis     "hands, knees, elbows, fingers" (01/25/2013)    Past Surgical History  Procedure Laterality Date  . Eus  12/24/2011    Procedure: FULL UPPER ENDOSCOPIC ULTRASOUND (EUS) RADIAL;  Surgeon: [name], MD;  Location: [facility];  Service: Endoscopy;  Laterality: N/A;  . Incision and drainage      thrombosed hemorrhoid  . Laparoscopic cholecystectomy  01/25/2013  . Coronary angioplasty with stent placement  11/27/2008    "2" (01/25/2013)  . Cholecystectomy N/A 01/25/2013    Procedure: LAPAROSCOPIC CHOLECYSTECTOMY WITH INTRAOPERATIVE CHOLANGIOGRAM;  Surgeon: [name], MD;  Location: [facility];  Service: General;  Laterality: N/A;     Current Outpatient Prescriptions  Medication Sig Dispense Refill  . acetaminophen (TYLENOL) 500 MG tablet Take 500 mg by mouth every 6 (six)  hours as needed.    Marland Kitchen amoxicillin-clavulanate (AUGMENTIN) 875-125 MG per tablet Take 1 tablet by mouth 2 (two) times daily. 20 tablet 0  . aspirin 81 MG tablet Take 81 mg by mouth daily.    Marland Kitchen azithromycin (ZITHROMAX Z-PAK) 250 MG tablet Take as directed on pack 6 tablet 0  . benzonatate (TESSALON) 100 MG capsule Take 1-2 capsules (100-200 mg total) by mouth 3 (three) times daily as needed for cough. 40 capsule 0  . desonide (DESOWEN) 0.05 % cream Apply 1 application topically 2 (two) times daily.     . diclofenac (VOLTAREN) 75 MG EC tablet Take 75 mg by mouth daily.  0  . hydrOXYzine (ATARAX/VISTARIL) 10 MG tablet Take 10 mg by mouth as needed. For sleep  0  . KRILL OIL 1000 MG CAPS Take 1,000 mg by mouth 2 (two) times daily.    . metoprolol succinate (TOPROL-XL) 50 MG 24 hr tablet Take 1 tablet (50 mg total) by mouth daily. Take with or immediately following a meal. 30 tablet 0  . Multiple Vitamin (MULTIVITAMIN WITH MINERALS) TABS Take 1 tablet by mouth daily.    Marland Kitchen omeprazole (PRILOSEC) 40 MG capsule Take 40 mg by mouth daily.    . ondansetron (ZOFRAN ODT) 4 MG disintegrating tablet 4mg  ODT q4 hours  prn nausea/vomit 6 tablet 0  . oxyCODONE-acetaminophen (PERCOCET) 10-325 MG per tablet Take 1 tablet by mouth every 4 (four) hours as needed for pain. 30 tablet 0  . predniSONE (DELTASONE) 20 MG tablet Take 2 tablets (40 mg total) by mouth daily. 10 tablet 0  . rosuvastatin (CRESTOR) 5 MG tablet Take 1 tablet (5 mg total) by mouth at bedtime. 90 tablet 1  . tamsulosin (FLOMAX) 0.4 MG CAPS capsule Take 0.4 mg by mouth daily.  0  . travoprost, benzalkonium, (TRAVATAN) 0.004 % ophthalmic solution Place 1 drop into both eyes at bedtime. One drop both eyes at bedtim    . zolpidem (AMBIEN) 10 MG tablet Take 1 tablet by mouth at bedtime.     No current facility-administered medications for this visit.    Allergies:   Codeine    Social History:  The patient  reports that he has never smoked. He has  never used smokeless tobacco. He reports that he does not drink alcohol or use illicit drugs.   Family History:  The patient's family history includes Breast cancer in his mother; Cancer in his mother; Heart disease in his father.    ROS:  Please see the history of present illness.   Otherwise, review of systems are positive for insomnia..   All other systems are reviewed and negative.    PHYSICAL EXAM: VS:  BP 115/64 mmHg  Pulse 62  Ht 5\' 6"  (1.676 m)  Wt 161 lb (73.029 kg)  BMI 26.00 kg/m2 , BMI Body mass index is 26 kg/(m^2). GEN: Well nourished, well developed, in no acute distress HEENT: normal Neck: no JVD, carotid bruits, or masses. Left carotid bruit is heard. Cardiac:2 to 3/6 crescendo decrescendo systolic murmur compatible with aortic stenosis. RRR;, rubs, or gallops,no edema  Respiratory:  clear to auscultation bilaterally, normal work of breathing GI: soft, nontender, nondistended, + BS MS: no deformity or atrophy Skin: warm and dry, no rash Neuro:  Strength and sensation are intact Psych: euthymic mood, full affect   EKG:  EKG is ordered today. The ekg ordered today demonstrates normal sinus rhythm with first-degree AV block and left atrial abnormality.   Recent Labs: 03/08/2014: ALT 70*    Lipid Panel    Component Value Date/Time   CHOL 151 03/08/2014 0747   TRIG 114.0 03/08/2014 0747   HDL 28.40* 03/08/2014 0747   CHOLHDL 5 03/08/2014 0747   VLDL 22.8 03/08/2014 0747   LDLCALC 100* 03/08/2014 0747      Wt Readings from Last 3 Encounters:  03/10/14 161 lb (73.029 kg)  07/02/13 165 lb (74.844 kg)  06/20/13 159 lb (72.122 kg)      Other studies Reviewed: Additional studies/ records that were reviewed today include: old medical records from [place]. Review of the above records demonstrates: bicuspid aortic valve.   ASSESSMENT AND PLAN:  1.  Coronary artery disease with prior drug-eluting stent in circumflex and right coronary artery. 2. Aortic  stenosis, previously documented bicuspid aortic valve. Clinically asymptomatic 3. Bilateral lower extremity discomfort with exertion compatible with claudication. 4. Hyperlipidemia with elevated ALT on intermittent statin therapy (5 days a week) 5. Left carotid bruit   Current medicines are reviewed at length with the patient today.  The patient has concerns regarding medicines.  The following changes have been made:  We will continue the current management strategy for hyperlipidemia with statin therapy 5 days per week. He will undergo a lower extremity Doppler evaluation to grade PVD. He will also have  bilateral carotid Dopplers performed because of a carotid bruit.  Labs/ tests ordered today include: see below  Orders Placed This Encounter  Procedures  . Hepatic function panel  . EKG 12-Lead  . Lower Extremity Arterial Doppler Bilateral  . Carotid duplex     Disposition:   FU with Linard Millers in 12  months   Signed, Sinclair Grooms, MD  03/10/2014 8:42 AM    [facility] [address], [place], [place]  [id number] Phone: [phone]; Fax: [phone]

## 2014-03-10 NOTE — Patient Instructions (Signed)
Your physician wants you to follow-up in: 12 months. You will receive a reminder letter in the mail two months in advance. If you don't receive a letter, please call our office to schedule the follow-up appointment.  Your physician has requested that you have a carotid duplex. This test is an ultrasound of the carotid arteries in your neck. It looks at blood flow through these arteries that supply the brain with blood. Allow one hour for this exam. There are no restrictions or special instructions.  Your physician has requested that you have a lower   extremity arterial duplex. This test is an ultrasound of the arteries in the legs.. It looks at arterial blood flow in the legs. Allow one hour for Lower  Arterial scans. There are no restrictions or special instructions  Your physician recommends that you return for lab work in: 1 month--You do not need to be fasting.  (liver function tests)

## 2014-03-22 ENCOUNTER — Encounter (HOSPITAL_COMMUNITY): Payer: Medicare Other

## 2014-03-29 ENCOUNTER — Ambulatory Visit (HOSPITAL_BASED_OUTPATIENT_CLINIC_OR_DEPARTMENT_OTHER): Payer: Medicare Other | Admitting: *Deleted

## 2014-03-29 ENCOUNTER — Ambulatory Visit (HOSPITAL_COMMUNITY): Payer: Medicare Other | Attending: Cardiovascular Disease | Admitting: *Deleted

## 2014-03-29 DIAGNOSIS — R0989 Other specified symptoms and signs involving the circulatory and respiratory systems: Secondary | ICD-10-CM

## 2014-03-29 DIAGNOSIS — I739 Peripheral vascular disease, unspecified: Secondary | ICD-10-CM | POA: Diagnosis not present

## 2014-03-29 DIAGNOSIS — E785 Hyperlipidemia, unspecified: Secondary | ICD-10-CM | POA: Insufficient documentation

## 2014-03-29 DIAGNOSIS — I10 Essential (primary) hypertension: Secondary | ICD-10-CM | POA: Diagnosis not present

## 2014-03-29 DIAGNOSIS — I2510 Atherosclerotic heart disease of native coronary artery without angina pectoris: Secondary | ICD-10-CM | POA: Diagnosis not present

## 2014-03-29 NOTE — Progress Notes (Signed)
Carotid Duplex Scan Performed 

## 2014-03-29 NOTE — Progress Notes (Signed)
Lower Extremity Arterial Doppler Exam - Performed

## 2014-03-30 ENCOUNTER — Telehealth: Payer: Self-pay

## 2014-03-30 NOTE — Telephone Encounter (Signed)
Pt aware of carotid dopp and Le dopp results Carotid- No significant blockage Le-No decrease in blood flow to the legs is noted Pt does have back pain and will f/ u with his pcp about tingling in his leg

## 2014-03-30 NOTE — Telephone Encounter (Signed)
-----   Message from [place], MD sent at 03/30/2014 12:24 PM EST ----- No decrease in blood flow to the legs is noted

## 2014-03-30 NOTE — Telephone Encounter (Signed)
-----   Message from [place], MD sent at 03/30/2014 12:24 PM EST ----- No decrease in blood flow to the legs is noted

## 2014-04-07 ENCOUNTER — Other Ambulatory Visit (INDEPENDENT_AMBULATORY_CARE_PROVIDER_SITE_OTHER): Payer: Medicare Other | Admitting: *Deleted

## 2014-04-07 DIAGNOSIS — E785 Hyperlipidemia, unspecified: Secondary | ICD-10-CM

## 2014-04-07 LAB — HEPATIC FUNCTION PANEL
ALT: 53 U/L (ref 0–53)
AST: 29 U/L (ref 0–37)
Albumin: 4.1 g/dL (ref 3.5–5.2)
Alkaline Phosphatase: 124 U/L — ABNORMAL HIGH (ref 39–117)
Bilirubin, Direct: 0.1 mg/dL (ref 0.0–0.3)
Total Bilirubin: 0.5 mg/dL (ref 0.2–1.2)
Total Protein: 6.5 g/dL (ref 6.0–8.3)

## 2014-04-10 ENCOUNTER — Telehealth: Payer: Self-pay

## 2014-04-10 DIAGNOSIS — R748 Abnormal levels of other serum enzymes: Secondary | ICD-10-CM

## 2014-04-10 DIAGNOSIS — E785 Hyperlipidemia, unspecified: Secondary | ICD-10-CM

## 2014-04-10 NOTE — Telephone Encounter (Signed)
-----   Message from Belva Crome, MD sent at 04/08/2014  3:52 PM EST ----- Normal

## 2014-04-10 NOTE — Telephone Encounter (Signed)
Pt aware of lab results.Normal Pt held Crestor on his own 1 week prior to lab. Update fwd to Dr.Smith to advise, if pt should resume or reduce dosage Adv pt that I will call back with his response. Pt agreeable

## 2014-04-11 NOTE — Telephone Encounter (Signed)
Needs repeat Liver panel after back on Crestor for 1 month.

## 2014-04-13 NOTE — Telephone Encounter (Signed)
called to give pt Dr.Smith's response. lmtcb 

## 2014-05-10 ENCOUNTER — Telehealth: Payer: Self-pay | Admitting: Interventional Cardiology

## 2014-05-10 MED ORDER — ROSUVASTATIN CALCIUM 5 MG PO TABS: 5.0000 mg | ORAL_TABLET | ORAL | Status: DC

## 2014-05-10 NOTE — Telephone Encounter (Signed)
Returned pt call. Left message with pt wife for pt to call back

## 2014-05-10 NOTE — Telephone Encounter (Signed)
New message      Returning Lisa's call

## 2014-05-10 NOTE — Telephone Encounter (Signed)
3rd attempt to give pt Dr.Smith's recommendation. lmtcb

## 2014-05-10 NOTE — Telephone Encounter (Signed)
Pt sts that he has resumed Crestor 1 and 1/2 wk ago.pt sts that he take Crestor 4 times a week. Pt adv that Dr.Smith wants him to repeat his lft while on Crestor.lab appt scheduled for 5/9. Pt verbalized understanding.

## 2014-06-05 ENCOUNTER — Other Ambulatory Visit: Payer: Medicare Other

## 2014-06-06 ENCOUNTER — Telehealth: Payer: Self-pay

## 2014-06-06 ENCOUNTER — Other Ambulatory Visit: Payer: Self-pay

## 2014-06-06 ENCOUNTER — Other Ambulatory Visit (INDEPENDENT_AMBULATORY_CARE_PROVIDER_SITE_OTHER): Payer: Medicare Other | Admitting: *Deleted

## 2014-06-06 DIAGNOSIS — E785 Hyperlipidemia, unspecified: Secondary | ICD-10-CM

## 2014-06-06 DIAGNOSIS — R748 Abnormal levels of other serum enzymes: Secondary | ICD-10-CM | POA: Diagnosis not present

## 2014-06-06 LAB — HEPATIC FUNCTION PANEL
ALT: 28 U/L (ref 0–53)
AST: 25 U/L (ref 0–37)
Albumin: 3.9 g/dL (ref 3.5–5.2)
Alkaline Phosphatase: 78 U/L (ref 39–117)
Bilirubin, Direct: 0.1 mg/dL (ref 0.0–0.3)
Total Bilirubin: 0.5 mg/dL (ref 0.2–1.2)
Total Protein: 6.4 g/dL (ref 6.0–8.3)

## 2014-06-06 NOTE — Telephone Encounter (Signed)
Pt aware of lab results. The liver tests are normal. Pt verbalized understanding.

## 2014-07-31 ENCOUNTER — Emergency Department (HOSPITAL_COMMUNITY)
Admission: EM | Admit: 2014-07-31 | Discharge: 2014-07-31 | Disposition: A | Discharge status: Home / Self Care | Payer: Medicare Other | Attending: Emergency Medicine | Admitting: Emergency Medicine

## 2014-07-31 ENCOUNTER — Encounter (HOSPITAL_COMMUNITY): Payer: Self-pay

## 2014-07-31 DIAGNOSIS — Z87438 Personal history of other diseases of male genital organs: Secondary | ICD-10-CM | POA: Insufficient documentation

## 2014-07-31 DIAGNOSIS — R531 Weakness: Secondary | ICD-10-CM | POA: Diagnosis not present

## 2014-07-31 DIAGNOSIS — Z862 Personal history of diseases of the blood and blood-forming organs and certain disorders involving the immune mechanism: Secondary | ICD-10-CM | POA: Insufficient documentation

## 2014-07-31 DIAGNOSIS — E785 Hyperlipidemia, unspecified: Secondary | ICD-10-CM | POA: Insufficient documentation

## 2014-07-31 DIAGNOSIS — Z7982 Long term (current) use of aspirin: Secondary | ICD-10-CM | POA: Diagnosis not present

## 2014-07-31 DIAGNOSIS — H409 Unspecified glaucoma: Secondary | ICD-10-CM | POA: Diagnosis not present

## 2014-07-31 DIAGNOSIS — Z79899 Other long term (current) drug therapy: Secondary | ICD-10-CM | POA: Insufficient documentation

## 2014-07-31 DIAGNOSIS — R011 Cardiac murmur, unspecified: Secondary | ICD-10-CM | POA: Insufficient documentation

## 2014-07-31 DIAGNOSIS — K529 Noninfective gastroenteritis and colitis, unspecified: Secondary | ICD-10-CM | POA: Diagnosis not present

## 2014-07-31 DIAGNOSIS — I10 Essential (primary) hypertension: Secondary | ICD-10-CM | POA: Diagnosis not present

## 2014-07-31 DIAGNOSIS — I2510 Atherosclerotic heart disease of native coronary artery without angina pectoris: Secondary | ICD-10-CM | POA: Diagnosis not present

## 2014-07-31 DIAGNOSIS — M1990 Unspecified osteoarthritis, unspecified site: Secondary | ICD-10-CM | POA: Insufficient documentation

## 2014-07-31 LAB — CBC WITH DIFFERENTIAL/PLATELET
Basophils Absolute: 0 10*3/uL (ref 0.0–0.1)
Basophils Relative: 0 % (ref 0–1)
Eosinophils Absolute: 0 10*3/uL (ref 0.0–0.7)
Eosinophils Relative: 0 % (ref 0–5)
HCT: 46.8 % (ref 39.0–52.0)
Hemoglobin: 15.6 g/dL (ref 13.0–17.0)
Lymphocytes Relative: 47 % — ABNORMAL HIGH (ref 12–46)
Lymphs Abs: 1.2 10*3/uL (ref 0.7–4.0)
MCH: 27.7 pg (ref 26.0–34.0)
MCHC: 33.3 g/dL (ref 30.0–36.0)
MCV: 83.1 fL (ref 78.0–100.0)
Monocytes Absolute: 0.4 10*3/uL (ref 0.1–1.0)
Monocytes Relative: 15 % — ABNORMAL HIGH (ref 3–12)
Neutro Abs: 1 10*3/uL — ABNORMAL LOW (ref 1.7–7.7)
Neutrophils Relative %: 38 % — ABNORMAL LOW (ref 43–77)
Platelets: 150 10*3/uL (ref 150–400)
RBC: 5.63 MIL/uL (ref 4.22–5.81)
RDW: 14.2 % (ref 11.5–15.5)
WBC: 2.6 10*3/uL — ABNORMAL LOW (ref 4.0–10.5)

## 2014-07-31 LAB — COMPREHENSIVE METABOLIC PANEL
ALT: 35 U/L (ref 17–63)
AST: 34 U/L (ref 15–41)
Albumin: 3.8 g/dL (ref 3.5–5.0)
Alkaline Phosphatase: 74 U/L (ref 38–126)
Anion gap: 6 (ref 5–15)
BUN: 21 mg/dL — ABNORMAL HIGH (ref 6–20)
CO2: 28 mmol/L (ref 22–32)
Calcium: 8.6 mg/dL — ABNORMAL LOW (ref 8.9–10.3)
Chloride: 101 mmol/L (ref 101–111)
Creatinine, Ser: 1.63 mg/dL — ABNORMAL HIGH (ref 0.61–1.24)
GFR calc Af Amer: 49 mL/min — ABNORMAL LOW (ref >60)
GFR calc non Af Amer: 42 mL/min — ABNORMAL LOW (ref >60)
Glucose, Bld: 88 mg/dL (ref 65–99)
Potassium: 4.6 mmol/L (ref 3.5–5.1)
Sodium: 135 mmol/L (ref 135–145)
Total Bilirubin: 0.7 mg/dL (ref 0.3–1.2)
Total Protein: 6.7 g/dL (ref 6.5–8.1)

## 2014-07-31 LAB — LIPASE, BLOOD: Lipase: 35 U/L (ref 22–51)

## 2014-07-31 LAB — URINALYSIS, ROUTINE W REFLEX MICROSCOPIC
Bilirubin Urine: NEGATIVE
Glucose, UA: NEGATIVE mg/dL
Hgb urine dipstick: NEGATIVE
Ketones, ur: NEGATIVE mg/dL
Leukocytes, UA: NEGATIVE
Nitrite: NEGATIVE
Protein, ur: NEGATIVE mg/dL
Specific Gravity, Urine: 1.016 (ref 1.005–1.030)
Urobilinogen, UA: 1 mg/dL (ref 0.0–1.0)
pH: 5.5 (ref 5.0–8.0)

## 2014-07-31 LAB — TROPONIN I: Troponin I: <0.03 ng/mL (ref <0.031)

## 2014-07-31 MED ORDER — ONDANSETRON HCL 4 MG PO TABS: 4.0000 mg | ORAL_TABLET | Freq: Four times a day (QID) | ORAL | Status: DC

## 2014-07-31 MED ORDER — ONDANSETRON HCL 4 MG/2ML IJ SOLN
4.0000 mg | Freq: Once | INTRAMUSCULAR | Status: AC
  Administered 2014-07-31: 4 mg via INTRAVENOUS
  Filled 2014-07-31: qty 2

## 2014-07-31 NOTE — ED Notes (Signed)
Patient tolerating PO fluids with no complaints of nausea or vomiting.

## 2014-07-31 NOTE — ED Notes (Signed)
Bed: WA02 Expected date:  Expected time:  Means of arrival:  Comments: HOld

## 2014-07-31 NOTE — ED Notes (Signed)
PA at bedside.

## 2014-07-31 NOTE — ED Provider Notes (Signed)
CSN: [id number]     Arrival date & time 07/31/14  1446 History   First MD Initiated Contact with Patient 07/31/14 1516     Chief Complaint  Patient presents with  . Nausea  . Weakness   HPI   89 YOM presents today with N/V/D. Pt reports symptoms initially began three days ago. He reports spending most of the last few days in bed due to fatigue. He denies fever chills, chest pain, abd pain, lower extremity swelling or edema. No alcohol abuse, nsaid use. He notes symptoms have been improving and has not had any vomiting today, just nausea. No exposure to abnormal food or drink, insect bites, rashes, or any close contacts with any similar symptoms. Pt reports he was able to tolerate soup, but was nauseous.   Past Medical History  Diagnosis Date  . Glaucoma     BOTH EYES  . Hypertension   . Enlarged prostate     FLOMAX HELPS PT VOID  . Coronary artery disease     HX OF HEART STENTS X 2   2010--DR. H. SMITH IS PT'S CARDIOLOGIST, MILD AORTIC STENOSIS  . Peripheral vascular disease     CLAUDICATION  . Dizziness   . Hyperlipidemia   . Heart murmur   . GERD (gastroesophageal reflux disease)     OTC PREVACID AS NEEDED  . Hypertension   . Gout   . Exertional shortness of breath   . Iron deficiency anemia     hx  . Migraine     "monthly" (01/25/2013)  . Arthritis     "hands, knees, elbows, fingers" (01/25/2013)   Past Surgical History  Procedure Laterality Date  . Eus  12/24/2011    Procedure: FULL UPPER ENDOSCOPIC ULTRASOUND (EUS) RADIAL;  Surgeon: [name], MD;  Location: [facility];  Service: Endoscopy;  Laterality: N/A;  . Incision and drainage      thrombosed hemorrhoid  . Laparoscopic cholecystectomy  01/25/2013  . Coronary angioplasty with stent placement  11/27/2008    "2" (01/25/2013)  . Cholecystectomy N/A 01/25/2013    Procedure: LAPAROSCOPIC CHOLECYSTECTOMY WITH INTRAOPERATIVE CHOLANGIOGRAM;  Surgeon: [name], MD;  Location: [facility];  Service: General;   Laterality: N/A;   Family History  Problem Relation Age of Onset  . Breast cancer Mother   . Cancer Mother   . Heart disease Father    History  Substance Use Topics  . Smoking status: Never Smoker   . Smokeless tobacco: Never Used  . Alcohol Use: No    Review of Systems  All other systems reviewed and are negative.   Allergies  Codeine  Home Medications   Prior to Admission medications   Medication Sig Start Date End Date Taking? Authorizing Provider  acetaminophen (TYLENOL) 500 MG tablet Take 500 mg by mouth every 6 (six) hours as needed.   Yes Historical Provider, MD  aspirin 81 MG tablet Take 81 mg by mouth daily.   Yes Historical Provider, MD  HYDROcodone-acetaminophen (NORCO/VICODIN) 5-325 MG per tablet Take 1 tablet by mouth every 4 (four) hours as needed. pain 06/13/14  Yes Historical Provider, MD  hydrOXYzine (ATARAX/VISTARIL) 10 MG tablet Take 10 mg by mouth as needed. For sleep 12/02/13  Yes Historical Provider, MD  KRILL OIL 1000 MG CAPS Take 1,000 mg by mouth 2 (two) times daily.   Yes Historical Provider, MD  metoprolol succinate (TOPROL-XL) 50 MG 24 hr tablet Take 1 tablet (50 mg total) by mouth daily. Take with or immediately following a  meal. 02/02/14  Yes Belva Crome, MD  Multiple Vitamin (MULTIVITAMIN WITH MINERALS) TABS Take 1 tablet by mouth daily.   Yes Historical Provider, MD  oxyCODONE-acetaminophen (PERCOCET) 10-325 MG per tablet Take 1 tablet by mouth every 6 (six) hours as needed for pain.   Yes Historical Provider, MD  rosuvastatin (CRESTOR) 5 MG tablet Take 1 tablet (5 mg total) by mouth 4 (four) times a week. Patient taking differently: Take 5 mg by mouth as directed. Patient states he takes medication for 3 days straight and then is off for 3 days and then will repeat. 3 on, 3off 05/10/14  Yes Belva Crome, MD  tamsulosin (FLOMAX) 0.4 MG CAPS capsule Take 0.4 mg by mouth daily. 02/24/14  Yes Historical Provider, MD  travoprost, benzalkonium, (TRAVATAN)  0.004 % ophthalmic solution Place 1 drop into both eyes at bedtime. One drop both eyes at bedtim   Yes Historical Provider, MD  zolpidem (AMBIEN) 10 MG tablet Take 1 tablet by mouth at bedtime. 12/13/12  Yes Historical Provider, MD  ondansetron (ZOFRAN) 4 MG tablet Take 1 tablet (4 mg total) by mouth every 6 (six) hours. 07/31/14   Okey Regal, PA-C  testosterone cypionate (DEPOTESTOSTERONE CYPIONATE) 200 MG/ML injection Inject 1 mL into the muscle every 14 (fourteen) days. 07/04/14   Historical Provider, MD   BP 130/71 mmHg  Pulse 71  Temp(Src) 97.8 F (36.6 C) (Oral)  Resp 11  Ht 5\' 8"  (1.727 m)  Wt 165 lb (74.844 kg)  BMI 25.09 kg/m2  SpO2 100%   Physical Exam  Constitutional: He is oriented to person, place, and time. He appears well-developed and well-nourished.  HENT:  Head: Normocephalic and atraumatic.  Eyes: Conjunctivae are normal. Pupils are equal, round, and reactive to light. Right eye exhibits no discharge. Left eye exhibits no discharge. No scleral icterus.  Neck: Normal range of motion. No JVD present. No tracheal deviation present.  Pulmonary/Chest: Effort normal. No stridor.  Abdominal: Soft. Bowel sounds are normal. He exhibits no distension and no mass. There is no tenderness. There is no rebound and no guarding.  Musculoskeletal:  No lower extremity swelling or edema.   Neurological: He is alert and oriented to person, place, and time. Coordination normal.  Skin:  No rashes  Psychiatric: He has a normal mood and affect. His behavior is normal. Judgment and thought content normal.  Nursing note and vitals reviewed.   ED Course  Procedures (including critical care time) Labs Review Labs Reviewed  CBC WITH DIFFERENTIAL/PLATELET - Abnormal; Notable for the following:    WBC 2.6 (*)    Neutrophils Relative % 38 (*)    Lymphocytes Relative 47 (*)    Monocytes Relative 15 (*)    Neutro Abs 1.0 (*)    All other components within normal limits  COMPREHENSIVE  METABOLIC PANEL - Abnormal; Notable for the following:    BUN 21 (*)    Creatinine, Ser 1.63 (*)    Calcium 8.6 (*)    GFR calc non Af Amer 42 (*)    GFR calc Af Amer 49 (*)    All other components within normal limits  LIPASE, BLOOD  URINALYSIS, ROUTINE W REFLEX MICROSCOPIC (NOT AT [facility])  TROPONIN I  PATHOLOGIST SMEAR REVIEW    Imaging Review No results found.   EKG Interpretation   Date/Time:  Monday July 31 2014 15:47:42 EDT Ventricular Rate:  66 PR Interval:  210 QRS Duration: 95 QT Interval:  396 QTC Calculation: 415 R Axis:  63 Text Interpretation:  Sinus rhythm ST elevation due to baseline wander  Confirmed by Hazle Coca [phone]) on 07/31/2014 4:47:09 PM      MDM   Final diagnoses:  Weakness  Gastroenteritis   Labs: Urinalysis, CBC, CMP, lipase, troponin- significant for WBC 2.6  1.63 creatinine  Imaging: ED EKG  Consults:  Therapeutics: Zofran, normal saline  Assessment:  Plan: Patient presents with weakness. Recent viral illness that is resolving. Patient was treated here in ED with antinausea and fluid with significant symptomatic improvement. By mouth challenge successful, no significant findings on lab to necessitate further evaluation or management here in the ED setting. Patient given strict return precautions, encouraged follow-up with primary care this week for reevaluation of symptoms, recheck of creatinine and WBC. Patient verbalizes understanding and agreement for today's plan and had no further questions concerns      Okey Regal, PA-C 07/31/14 [place], MD 07/31/14 [phone]

## 2014-07-31 NOTE — Discharge Instructions (Signed)
Please follow-up with primary care provider in 3 days for reevaluation. Please inform him of your elevated creatinine and kidney functions, and low white blood cells. These have follow-up labs completed within the next week.

## 2014-07-31 NOTE — ED Notes (Signed)
Patient states he has had intermittent N/V, weakness, and a small amount of diarrhea x 2 days. Patient denies any blood in his vomit or stool.

## 2014-08-01 LAB — PATHOLOGIST SMEAR REVIEW

## 2014-12-06 ENCOUNTER — Telehealth: Payer: Self-pay | Admitting: Interventional Cardiology

## 2014-12-06 NOTE — Telephone Encounter (Signed)
New message     Calling to see if his upcoming labs are fasting or nonfasting?

## 2014-12-06 NOTE — Telephone Encounter (Signed)
Returned pt wife call. Advise her that pt upcoming lab appt, should be for fasting labs. She verbalized understanding.

## 2014-12-11 ENCOUNTER — Other Ambulatory Visit: Payer: Medicare Other

## 2014-12-14 ENCOUNTER — Other Ambulatory Visit (INDEPENDENT_AMBULATORY_CARE_PROVIDER_SITE_OTHER): Payer: Medicare Other | Admitting: *Deleted

## 2014-12-14 DIAGNOSIS — E785 Hyperlipidemia, unspecified: Secondary | ICD-10-CM | POA: Diagnosis not present

## 2014-12-14 LAB — HEPATIC FUNCTION PANEL
ALT: 23 U/L (ref 9–46)
AST: 29 U/L (ref 10–35)
Albumin: 3.6 g/dL (ref 3.6–5.1)
Alkaline Phosphatase: 49 U/L (ref 40–115)
Bilirubin, Direct: 0.2 mg/dL (ref <=0.2)
Indirect Bilirubin: 0.3 mg/dL (ref 0.2–1.2)
Total Bilirubin: 0.5 mg/dL (ref 0.2–1.2)
Total Protein: 6.1 g/dL (ref 6.1–8.1)

## 2014-12-14 LAB — LIPID PANEL
Cholesterol: 109 mg/dL — ABNORMAL LOW (ref 125–200)
HDL: 20 mg/dL — ABNORMAL LOW (ref >=40)
LDL Cholesterol: 72 mg/dL (ref <130)
Total CHOL/HDL Ratio: 5.5 Ratio — ABNORMAL HIGH (ref <=5.0)
Triglycerides: 83 mg/dL (ref <150)
VLDL: 17 mg/dL (ref <30)

## 2014-12-14 NOTE — Addendum Note (Signed)
Addended by: Eulis Foster on: 12/14/2014 09:00 AM   Modules accepted: Orders

## 2014-12-14 NOTE — Addendum Note (Signed)
Addended by: Eulis Foster on: 12/14/2014 09:01 AM   Modules accepted: Orders

## 2014-12-15 ENCOUNTER — Telehealth: Payer: Self-pay | Admitting: Interventional Cardiology

## 2014-12-15 NOTE — Telephone Encounter (Signed)
Follow up       New problem  Pt's wife returning your call concerning lab results

## 2014-12-15 NOTE — Telephone Encounter (Signed)
Pt aware of results 

## 2014-12-15 NOTE — Telephone Encounter (Signed)
New problem   Pt's wife returning your call concerning lab results

## 2015-01-23 ENCOUNTER — Other Ambulatory Visit: Payer: Self-pay

## 2015-01-23 DIAGNOSIS — E785 Hyperlipidemia, unspecified: Secondary | ICD-10-CM

## 2015-01-23 MED ORDER — ROSUVASTATIN CALCIUM 5 MG PO TABS: 5.0000 mg | ORAL_TABLET | ORAL | Status: DC

## 2015-01-24 ENCOUNTER — Emergency Department (HOSPITAL_COMMUNITY): Payer: Medicare Other

## 2015-01-24 ENCOUNTER — Emergency Department (HOSPITAL_COMMUNITY)
Admission: EM | Admit: 2015-01-24 | Discharge: 2015-01-24 | Disposition: A | Discharge status: Home / Self Care | Payer: Medicare Other | Attending: Emergency Medicine | Admitting: Emergency Medicine

## 2015-01-24 ENCOUNTER — Encounter (HOSPITAL_COMMUNITY): Payer: Self-pay | Admitting: Emergency Medicine

## 2015-01-24 DIAGNOSIS — I2510 Atherosclerotic heart disease of native coronary artery without angina pectoris: Secondary | ICD-10-CM | POA: Diagnosis not present

## 2015-01-24 DIAGNOSIS — Z9861 Coronary angioplasty status: Secondary | ICD-10-CM | POA: Diagnosis not present

## 2015-01-24 DIAGNOSIS — Z7982 Long term (current) use of aspirin: Secondary | ICD-10-CM | POA: Diagnosis not present

## 2015-01-24 DIAGNOSIS — H409 Unspecified glaucoma: Secondary | ICD-10-CM | POA: Insufficient documentation

## 2015-01-24 DIAGNOSIS — R011 Cardiac murmur, unspecified: Secondary | ICD-10-CM | POA: Diagnosis not present

## 2015-01-24 DIAGNOSIS — N400 Enlarged prostate without lower urinary tract symptoms: Secondary | ICD-10-CM | POA: Insufficient documentation

## 2015-01-24 DIAGNOSIS — I10 Essential (primary) hypertension: Secondary | ICD-10-CM | POA: Diagnosis not present

## 2015-01-24 DIAGNOSIS — Z8719 Personal history of other diseases of the digestive system: Secondary | ICD-10-CM | POA: Insufficient documentation

## 2015-01-24 DIAGNOSIS — R5383 Other fatigue: Secondary | ICD-10-CM | POA: Diagnosis present

## 2015-01-24 DIAGNOSIS — M158 Other polyosteoarthritis: Secondary | ICD-10-CM | POA: Diagnosis not present

## 2015-01-24 DIAGNOSIS — E785 Hyperlipidemia, unspecified: Secondary | ICD-10-CM | POA: Insufficient documentation

## 2015-01-24 DIAGNOSIS — Z79899 Other long term (current) drug therapy: Secondary | ICD-10-CM | POA: Diagnosis not present

## 2015-01-24 DIAGNOSIS — Z862 Personal history of diseases of the blood and blood-forming organs and certain disorders involving the immune mechanism: Secondary | ICD-10-CM | POA: Diagnosis not present

## 2015-01-24 DIAGNOSIS — R531 Weakness: Secondary | ICD-10-CM | POA: Diagnosis not present

## 2015-01-24 LAB — CBC WITH DIFFERENTIAL/PLATELET
Basophils Absolute: 0 10*3/uL (ref 0.0–0.1)
Basophils Relative: 0 %
Eosinophils Absolute: 0 10*3/uL (ref 0.0–0.7)
Eosinophils Relative: 0 %
HCT: 41.9 % (ref 39.0–52.0)
Hemoglobin: 13.7 g/dL (ref 13.0–17.0)
Lymphocytes Relative: 44 %
Lymphs Abs: 1.9 10*3/uL (ref 0.7–4.0)
MCH: 26.4 pg (ref 26.0–34.0)
MCHC: 32.7 g/dL (ref 30.0–36.0)
MCV: 80.7 fL (ref 78.0–100.0)
Monocytes Absolute: 0.5 10*3/uL (ref 0.1–1.0)
Monocytes Relative: 10 %
Neutro Abs: 2 10*3/uL (ref 1.7–7.7)
Neutrophils Relative %: 46 %
Platelets: 159 10*3/uL (ref 150–400)
RBC: 5.19 MIL/uL (ref 4.22–5.81)
RDW: 15.7 % — ABNORMAL HIGH (ref 11.5–15.5)
WBC: 4.3 10*3/uL (ref 4.0–10.5)

## 2015-01-24 LAB — BASIC METABOLIC PANEL
Anion gap: 7 (ref 5–15)
BUN: 18 mg/dL (ref 6–20)
CO2: 26 mmol/L (ref 22–32)
Calcium: 8.8 mg/dL — ABNORMAL LOW (ref 8.9–10.3)
Chloride: 105 mmol/L (ref 101–111)
Creatinine, Ser: 1.37 mg/dL — ABNORMAL HIGH (ref 0.61–1.24)
GFR calc Af Amer: >60 mL/min (ref >60)
GFR calc non Af Amer: 52 mL/min — ABNORMAL LOW (ref >60)
Glucose, Bld: 74 mg/dL (ref 65–99)
Potassium: 4.1 mmol/L (ref 3.5–5.1)
Sodium: 138 mmol/L (ref 135–145)

## 2015-01-24 LAB — TROPONIN I: Troponin I: <0.03 ng/mL (ref <0.031)

## 2015-01-24 NOTE — ED Notes (Addendum)
Pt reported lt side chest pain nonradiating, (+)SHOB with exertion, (-) diaphoresis, (+)nausea, (+) lightheadedness for 4 days.

## 2015-01-24 NOTE — Discharge Instructions (Signed)
Tests showed no life-threatening conditions. Follow-up your primary care doctor and/or cardiologist.

## 2015-01-24 NOTE — ED Notes (Addendum)
Pt comes to ed with fatigue and nausea, c/o of chest fluttering, pt skipped two doses of cholesterol meds. Continue triage in room 12

## 2015-01-24 NOTE — ED Notes (Signed)
Patient transported to X-ray and returned to room without distress noted. 

## 2015-01-24 NOTE — ED Notes (Signed)
Awake. Verbally responsive. A/O x4. Resp even and unlabored. No audible adventitious breath sounds noted. ABC's intact. SR on monitor. IV saline lock patent and intact. Pt given meal to eat. Waiting on MD.

## 2015-01-24 NOTE — ED Provider Notes (Signed)
CSN: [id number]     Arrival date & time 01/24/15  [id number] History   First MD Initiated Contact with Patient 01/24/15 [phone]     Chief Complaint  Patient presents with  . Fatigue     (Consider location/radiation/quality/duration/timing/severity/associated sxs/prior Treatment) HPI.... Patient presents with sensation of fatigue and low energy today. He says his chest was fluttering, however no frank chest pain.  C/o lightheadedness and slight dyspnea with exertion. Past medical history includes CAD with stenting 2 approximately 5 years ago. Severity of symptoms is mild to moderate. Nothing makes symptoms better or worse.  Past Medical History  Diagnosis Date  . Glaucoma     BOTH EYES  . Hypertension   . Enlarged prostate     FLOMAX HELPS PT VOID  . Coronary artery disease     HX OF HEART STENTS X 2   2010--DR. H. SMITH IS PT'S CARDIOLOGIST, MILD AORTIC STENOSIS  . Peripheral vascular disease ([place])     CLAUDICATION  . Dizziness   . Hyperlipidemia   . Heart murmur   . GERD (gastroesophageal reflux disease)     OTC PREVACID AS NEEDED  . Hypertension   . Gout   . Exertional shortness of breath   . Iron deficiency anemia     hx  . Migraine     "monthly" (01/25/2013)  . Arthritis     "hands, knees, elbows, fingers" (01/25/2013)   Past Surgical History  Procedure Laterality Date  . Eus  12/24/2011    Procedure: FULL UPPER ENDOSCOPIC ULTRASOUND (EUS) RADIAL;  Surgeon: [name], MD;  Location: [facility];  Service: Endoscopy;  Laterality: N/A;  . Incision and drainage      thrombosed hemorrhoid  . Laparoscopic cholecystectomy  01/25/2013  . Coronary angioplasty with stent placement  11/27/2008    "2" (01/25/2013)  . Cholecystectomy N/A 01/25/2013    Procedure: LAPAROSCOPIC CHOLECYSTECTOMY WITH INTRAOPERATIVE CHOLANGIOGRAM;  Surgeon: [name], MD;  Location: [place];  Service: General;  Laterality: N/A;   Family History  Problem Relation Age of Onset  . Breast cancer  Mother   . Cancer Mother   . Heart disease Father    Social History  Substance Use Topics  . Smoking status: Never Smoker   . Smokeless tobacco: Never Used  . Alcohol Use: No    Review of Systems  All other systems reviewed and are negative.     Allergies  Codeine  Home Medications   Prior to Admission medications   Medication Sig Start Date End Date Taking? Authorizing Provider  acetaminophen (TYLENOL) 500 MG tablet Take 500 mg by mouth every 6 (six) hours as needed for moderate pain.    Yes Historical Provider, MD  Ascorbic Acid (VITAMIN C PO) Take 1 tablet by mouth daily.   Yes Historical Provider, MD  aspirin 81 MG tablet Take 81 mg by mouth daily.   Yes Historical Provider, MD  Coenzyme Q10 (CO Q 10) 10 MG CAPS Take 1 capsule by mouth daily.   Yes Historical Provider, MD  hydrOXYzine (ATARAX/VISTARIL) 10 MG tablet Take 5 mg by mouth at bedtime as needed (sleep). For sleep 12/02/13  Yes Historical Provider, MD  KRILL OIL 1000 MG CAPS Take 1,000 mg by mouth 2 (two) times daily.   Yes Historical Provider, MD  metoprolol succinate (TOPROL-XL) 50 MG 24 hr tablet Take 1 tablet (50 mg total) by mouth daily. Take with or immediately following a meal. Patient taking differently: Take 25 mg by mouth daily. Take  with or immediately following a meal. 02/02/14  Yes Belva Crome, MD  Multiple Vitamin (MULTIVITAMIN WITH MINERALS) TABS Take 1 tablet by mouth daily.   Yes Historical Provider, MD  rosuvastatin (CRESTOR) 5 MG tablet Take 1 tablet (5 mg total) by mouth 4 (four) times a week. 01/23/15  Yes Belva Crome, MD  tamsulosin (FLOMAX) 0.4 MG CAPS capsule Take 0.4 mg by mouth daily. 02/24/14  Yes Historical Provider, MD  testosterone cypionate (DEPOTESTOSTERONE CYPIONATE) 200 MG/ML injection Inject 1 mL into the muscle every 14 (fourteen) days. 07/04/14  Yes Historical Provider, MD  travoprost, benzalkonium, (TRAVATAN) 0.004 % ophthalmic solution Place 1 drop into both eyes at bedtime.    Yes  Historical Provider, MD  zolpidem (AMBIEN) 10 MG tablet Take 5 tablets by mouth at bedtime.  12/13/12  Yes Historical Provider, MD  ondansetron (ZOFRAN) 4 MG tablet Take 1 tablet (4 mg total) by mouth every 6 (six) hours. Patient not taking: Reported on 01/24/2015 07/31/14   Okey Regal, PA-C   BP 111/73 mmHg  Pulse 68  Temp(Src) 97.4 F (36.3 C) (Oral)  Resp 17  SpO2 99% Physical Exam  Constitutional: He is oriented to person, place, and time. He appears well-developed and well-nourished.  HENT:  Head: Normocephalic and atraumatic.  Eyes: Conjunctivae and EOM are normal. Pupils are equal, round, and reactive to light.  Neck: Normal range of motion. Neck supple.  Cardiovascular: Normal rate and regular rhythm.   Pulmonary/Chest: Effort normal and breath sounds normal.  Abdominal: Soft. Bowel sounds are normal.  Musculoskeletal: Normal range of motion.  Neurological: He is alert and oriented to person, place, and time.  Skin: Skin is warm and dry.  Psychiatric: He has a normal mood and affect. His behavior is normal.  Nursing note and vitals reviewed.   ED Course  Procedures (including critical care time) Labs Review Labs Reviewed  BASIC METABOLIC PANEL - Abnormal; Notable for the following:    Creatinine, Ser 1.37 (*)    Calcium 8.8 (*)    GFR calc non Af Amer 52 (*)    All other components within normal limits  CBC WITH DIFFERENTIAL/PLATELET - Abnormal; Notable for the following:    RDW 15.7 (*)    All other components within normal limits  TROPONIN I  CBC WITH DIFFERENTIAL/PLATELET    Imaging Review Dg Chest 2 View  01/24/2015  CLINICAL DATA:  Mid chest pain and shortness of breath with exertion for 2 days, history hypertension, coronary artery disease post stenting, hyperlipidemia EXAM: CHEST  2 VIEW COMPARISON:  06/20/2013, 01/13/2013 FINDINGS: Normal heart size, mediastinal contours, and pulmonary vascularity. Lungs grossly clear. No definite infiltrate, pleural  effusion or pneumothorax. Osseous structures unremarkable. IMPRESSION: No acute abnormalities. Electronically Signed   By: Lavonia Dana M.D.   On: 01/24/2015 10:22   I have personally reviewed and evaluated these images and lab results as part of my medical decision-making.   EKG Interpretation   Date/Time:  Wednesday January 24 2015 08:57:30 EST Ventricular Rate:  77 PR Interval:  210 QRS Duration: 78 QT Interval:  346 QTC Calculation: 391 R Axis:   72 Text Interpretation:  Sinus rhythm Borderline ST elevation, anterolateral  leads Confirmed by Lacinda Axon  MD, Davine Sweney ([id number]) on 01/24/2015 9:48:33 AM Also  confirmed by Lacinda Axon  MD, Shaily Librizzi ([id number])  on 01/24/2015 9:49:15 AM      MDM   Final diagnoses:  Weakness    Patient appears in no acute distress. Screening tests including hemoglobin,  troponin, chest x-ray, EKG all negative. He has primary care and cardiology follow-up.    Nat Christen, MD 01/24/15 [phone]

## 2015-01-24 NOTE — ED Notes (Signed)
Awake. Verbally responsive. A/O x4. Resp even and unlabored. No audible adventitious breath sounds noted. ABC's intact.  

## 2015-01-24 NOTE — ED Notes (Signed)
Awake. Verbally responsive. A/O x4. Resp even and unlabored. No audible adventitious breath sounds noted. ABC's intact. SR on monitor. IV saline lock patent and intact. 

## 2015-03-05 ENCOUNTER — Telehealth: Payer: Self-pay | Admitting: Interventional Cardiology

## 2015-03-05 NOTE — Telephone Encounter (Signed)
Pt feels like something is going on with his heart. For the last month, he feels a "flutter" in his heart. He went to the ER 2 weeks ago, but everything checked out ok. When he exerts himself or gets excited, it is worse  Made an OV with Glean Hess NP for 2/7 1030am

## 2015-03-05 NOTE — Telephone Encounter (Signed)
New messge     Pt feels like something is going on with his heart.  For the last month, he feels a "flutter" in his heart.  He went to the ER 2 weeks ago, but everything checked out ok.  When he exerts himself or gets excited, it is worse.  PLEASE CALL ONLY ON CELL PHONE [phone].  He does not want to upset his wife.

## 2015-03-06 ENCOUNTER — Ambulatory Visit (INDEPENDENT_AMBULATORY_CARE_PROVIDER_SITE_OTHER): Payer: Medicare Other | Admitting: Nurse Practitioner

## 2015-03-06 ENCOUNTER — Encounter (INDEPENDENT_AMBULATORY_CARE_PROVIDER_SITE_OTHER): Payer: Medicare Other

## 2015-03-06 ENCOUNTER — Encounter: Payer: Self-pay | Admitting: Nurse Practitioner

## 2015-03-06 VITALS — BP 120/82 | HR 66 | Ht 67.0 in | Wt 167.8 lb

## 2015-03-06 DIAGNOSIS — R002 Palpitations: Secondary | ICD-10-CM | POA: Diagnosis not present

## 2015-03-06 DIAGNOSIS — I259 Chronic ischemic heart disease, unspecified: Secondary | ICD-10-CM

## 2015-03-06 DIAGNOSIS — I350 Nonrheumatic aortic (valve) stenosis: Secondary | ICD-10-CM

## 2015-03-06 DIAGNOSIS — R0600 Dyspnea, unspecified: Secondary | ICD-10-CM

## 2015-03-06 LAB — CBC
HCT: 42.3 % (ref 39.0–52.0)
Hemoglobin: 14.3 g/dL (ref 13.0–17.0)
MCH: 28.2 pg (ref 26.0–34.0)
MCHC: 33.8 g/dL (ref 30.0–36.0)
MCV: 83.4 fL (ref 78.0–100.0)
MPV: 10.2 fL (ref 8.6–12.4)
Platelets: 175 10*3/uL (ref 150–400)
RBC: 5.07 MIL/uL (ref 4.22–5.81)
RDW: 17.1 % — ABNORMAL HIGH (ref 11.5–15.5)
WBC: 3.4 10*3/uL — ABNORMAL LOW (ref 4.0–10.5)

## 2015-03-06 LAB — BASIC METABOLIC PANEL
BUN: 20 mg/dL (ref 7–25)
CO2: 26 mmol/L (ref 20–31)
Calcium: 8.6 mg/dL (ref 8.6–10.3)
Chloride: 104 mmol/L (ref 98–110)
Creat: 1.68 mg/dL — ABNORMAL HIGH (ref 0.70–1.25)
Glucose, Bld: 78 mg/dL (ref 65–99)
Potassium: 4.7 mmol/L (ref 3.5–5.3)
Sodium: 139 mmol/L (ref 135–146)

## 2015-03-06 LAB — TSH: TSH: 1.55 mIU/L (ref 0.40–4.50)

## 2015-03-06 NOTE — Progress Notes (Signed)
CARDIOLOGY OFFICE NOTE  Date:  03/06/2015    [place] Date of Birth: [birth year] Medical Record [id number]  PCP:  Ricke Hey, MD  Cardiologist:  Tamala Julian    Chief Complaint  Patient presents with  . Aortic Stenosis  . Coronary Artery Disease  . Hyperlipidemia  . Irregular Heart Beat  . Hypertension    Work in visit - seen for Dr. Tamala Julian    History of Present Illness: Robert Estes is a 70 y.o. male who presents today for a follow up visit. Seen for Dr. Tamala Julian.   He has a history of HTN, HLD and known CAD. Remote stents from 2010. Other issues include PVD, AS (apparently has bicuspid AV) and GERD.   Last seen in February of 2016. Cardiac status seemed stable. PV studies updated.   In the ER back at the end of December - fatigue and "chest fluttering". Negative evaluation.   Phone call here yesterday -  Pt feels like something is going on with his heart. For the last month, he feels a "flutter" in his heart. He went to the ER 2 weeks ago, but everything checked out ok. When he exerts himself or gets excited, it is worse  Made an OV with Glean Hess NP for 2/7 1030am       Thus added to my schedule.   Comes back today. Here alone. Says he is nervous. Has not felt well for past couple of months.  Says his heart is "hop scotching along" with palpitations. This is happening every day. No actual chest pain. He has noted more DOE. No syncope. He has had some dizziness - mostly when he bends over and not with his palpitations. He feels like something is wrong. He continues to work as a Dealer. More symptoms noted when he is at work. Does better at night. Asking when he is to see Dr. Tamala Julian - not called/no recall letter per patient.    Past Medical History  Diagnosis Date  . Glaucoma     BOTH EYES  . Hypertension   . Enlarged prostate     FLOMAX HELPS PT VOID  . Coronary artery disease     HX OF HEART STENTS X 2   2010--DR. H. SMITH IS PT'S  CARDIOLOGIST, MILD AORTIC STENOSIS  . Peripheral vascular disease ([place])     CLAUDICATION  . Dizziness   . Hyperlipidemia   . Heart murmur   . GERD (gastroesophageal reflux disease)     OTC PREVACID AS NEEDED  . Hypertension   . Gout   . Exertional shortness of breath   . Iron deficiency anemia     hx  . Migraine     "monthly" (01/25/2013)  . Arthritis     "hands, knees, elbows, fingers" (01/25/2013)    Past Surgical History  Procedure Laterality Date  . Eus  12/24/2011    Procedure: FULL UPPER ENDOSCOPIC ULTRASOUND (EUS) RADIAL;  Surgeon: [name], MD;  Location: [facility];  Service: Endoscopy;  Laterality: N/A;  . Incision and drainage      thrombosed hemorrhoid  . Laparoscopic cholecystectomy  01/25/2013  . Coronary angioplasty with stent placement  11/27/2008    "2" (01/25/2013)  . Cholecystectomy N/A 01/25/2013    Procedure: LAPAROSCOPIC CHOLECYSTECTOMY WITH INTRAOPERATIVE CHOLANGIOGRAM;  Surgeon: [name], MD;  Location: [place];  Service: General;  Laterality: N/A;     Medications: Current Outpatient Prescriptions  Medication Sig Dispense Refill  . acetaminophen (TYLENOL) 500  MG tablet Take 500 mg by mouth every 6 (six) hours as needed for moderate pain.     . Ascorbic Acid (VITAMIN C PO) Take 1 tablet by mouth daily.    Marland Kitchen aspirin 81 MG tablet Take 81 mg by mouth daily.    . Coenzyme Q10 (CO Q 10) 10 MG CAPS Take 1 capsule by mouth daily.    . diclofenac (VOLTAREN) 75 MG EC tablet Take 75 mg by mouth 2 (two) times daily.    . hydrOXYzine (ATARAX/VISTARIL) 10 MG tablet Take 5 mg by mouth at bedtime as needed (sleep). For sleep  0  . KRILL OIL 1000 MG CAPS Take 1,000 mg by mouth 2 (two) times daily.    . metoprolol succinate (TOPROL-XL) 50 MG 24 hr tablet Take 1 tablet (50 mg total) by mouth daily. Take with or immediately following a meal. (Patient taking differently: Take 25 mg by mouth daily. Take with or immediately following a meal.) 30 tablet 0  .  rosuvastatin (CRESTOR) 5 MG tablet Take 1 tablet (5 mg total) by mouth 4 (four) times a week. 16 tablet 11  . tamsulosin (FLOMAX) 0.4 MG CAPS capsule Take 0.4 mg by mouth daily.  0  . testosterone cypionate (DEPOTESTOSTERONE CYPIONATE) 200 MG/ML injection Inject 1 mL into the muscle every 14 (fourteen) days.  0  . travoprost, benzalkonium, (TRAVATAN) 0.004 % ophthalmic solution Place 1 drop into both eyes at bedtime.     Marland Kitchen zolpidem (AMBIEN) 10 MG tablet Take 0.5 tablets by mouth at bedtime.    . Multiple Vitamin (MULTIVITAMIN WITH MINERALS) TABS Take 1 tablet by mouth daily. Reported on 03/06/2015     No current facility-administered medications for this visit.    Allergies: Allergies  Allergen Reactions  . Codeine Itching    Social History: The patient  reports that he has never smoked. He has never used smokeless tobacco. He reports that he does not drink alcohol or use illicit drugs.   Family History: The patient's family history includes Breast cancer in his mother; Cancer in his mother; Heart disease in his father.   Review of Systems: Please see the history of present illness.   Otherwise, the review of systems is positive for none.   All other systems are reviewed and negative.   Physical Exam: VS:  BP 120/82 mmHg  Pulse 66  Ht 5\' 7"  (1.702 m)  Wt 167 lb 12.8 oz (76.114 kg)  BMI 26.28 kg/m2 .  BMI Body mass index is 26.28 kg/(m^2).  Wt Readings from Last 3 Encounters:  03/06/15 167 lb 12.8 oz (76.114 kg)  07/31/14 165 lb (74.844 kg)  03/10/14 161 lb (73.029 kg)    General: Pleasant. Well developed, well nourished and in no acute distress.  HEENT: Normal. Neck: Supple, no JVD, carotid bruits, or masses noted.  Cardiac: Regular rate and rhythm. Harsh outflow murmur. No edema.  Respiratory:  Lungs are clear to auscultation bilaterally with normal work of breathing.  GI: Soft and nontender.  MS: No deformity or atrophy. Gait and ROM intact. Skin: Warm and dry. Color is  normal.  Neuro:  Strength and sensation are intact and no gross focal deficits noted.  Psych: Alert, appropriate and with normal affect.   LABORATORY DATA:  EKG:  EKG is ordered today. This demonstrates NSR with early repolarization.  Lab Results  Component Value Date   WBC 4.3 01/24/2015   HGB 13.7 01/24/2015   HCT 41.9 01/24/2015   PLT 159 01/24/2015  GLUCOSE 74 01/24/2015   CHOL 109* 12/14/2014   TRIG 83 12/14/2014   HDL 20* 12/14/2014   LDLCALC 72 12/14/2014   ALT 23 12/14/2014   AST 29 12/14/2014   NA 138 01/24/2015   K 4.1 01/24/2015   CL 105 01/24/2015   CREATININE 1.37* 01/24/2015   BUN 18 01/24/2015   CO2 26 01/24/2015    BNP (last 3 results) No results for input(s): BNP in the last 8760 hours.  ProBNP (last 3 results) No results for input(s): PROBNP in the last 8760 hours.   Other Studies Reviewed Today:   Assessment/Plan: 1. Palpitations -  Will place 48 hour Holter and check labs. For now no change in his medicines. He remains on low dose beta blocker.   2. Aortic stenosis - presumed bicuspid AV - need old records. Needs echo updated.   3. CAD with prior PCI - no active chest pain.   4. HTN - BP good on current regimen.   5. HLD - on low dose Crestor. Lipids from November look good.   6. PVD   Current medicines are reviewed with the patient today.  The patient does not have concerns regarding medicines other than what has been noted above.  The following changes have been made:  See above.  Labs/ tests ordered today include:    Orders Placed This Encounter  Procedures  . TSH  . Basic metabolic panel  . CBC  . Holter monitor - 48 hour  . EKG 12-Lead  . ECHOCARDIOGRAM COMPLETE     Disposition:   Needs recall OV with Dr. Tamala Julian.    Patient is agreeable to this plan and will call if any problems develop in the interim.   Signed: Burtis Junes, RN, ANP-C 03/06/2015 11:10 AM  [facility] [address] [place], [place]  [address] Phone: [phone] Fax: [phone]

## 2015-03-06 NOTE — Patient Instructions (Addendum)
We will be checking the following labs today - BMET, CBC and TSH   Medication Instructions:    Continue with your current medicines.     Testing/Procedures To Be Arranged:  Echocardiogram   48 hour Holter  Follow-Up:   Needs recall visit with Dr. Tamala Julian after studies complete    Other Special Instructions:   Stop your caffeine    If you need a refill on your cardiac medications before your next appointment, please call your pharmacy.   Call the [place] office at [phone] if you have any questions, problems or concerns.

## 2015-03-07 ENCOUNTER — Telehealth: Payer: Self-pay | Admitting: Nurse Practitioner

## 2015-03-07 NOTE — Telephone Encounter (Signed)
Pt rtn call to get results--pls call [phone]

## 2015-03-08 NOTE — Telephone Encounter (Signed)
Pt rtn call to get results of lab--pls call at [phone] or cell [phone]

## 2015-03-14 ENCOUNTER — Other Ambulatory Visit: Payer: Self-pay

## 2015-03-14 ENCOUNTER — Ambulatory Visit (HOSPITAL_COMMUNITY): Payer: Medicare Other | Attending: Cardiovascular Disease

## 2015-03-14 DIAGNOSIS — I359 Nonrheumatic aortic valve disorder, unspecified: Secondary | ICD-10-CM | POA: Diagnosis present

## 2015-03-14 DIAGNOSIS — I259 Chronic ischemic heart disease, unspecified: Secondary | ICD-10-CM | POA: Diagnosis not present

## 2015-03-14 DIAGNOSIS — I10 Essential (primary) hypertension: Secondary | ICD-10-CM | POA: Diagnosis not present

## 2015-03-14 DIAGNOSIS — R0600 Dyspnea, unspecified: Secondary | ICD-10-CM

## 2015-03-14 DIAGNOSIS — I350 Nonrheumatic aortic (valve) stenosis: Secondary | ICD-10-CM | POA: Diagnosis not present

## 2015-03-14 DIAGNOSIS — E785 Hyperlipidemia, unspecified: Secondary | ICD-10-CM | POA: Diagnosis not present

## 2015-03-14 DIAGNOSIS — R002 Palpitations: Secondary | ICD-10-CM | POA: Diagnosis not present

## 2015-03-14 MED ORDER — ROSUVASTATIN CALCIUM 5 MG PO TABS: 5.0000 mg | ORAL_TABLET | ORAL | Status: DC

## 2015-03-14 MED ORDER — METOPROLOL SUCCINATE ER 50 MG PO TB24: 50.0000 mg | ORAL_TABLET | Freq: Every day | ORAL | Status: DC

## 2015-03-22 ENCOUNTER — Other Ambulatory Visit: Payer: Self-pay | Admitting: Nephrology

## 2015-03-22 DIAGNOSIS — N183 Chronic kidney disease, stage 3 unspecified: Secondary | ICD-10-CM

## 2015-03-28 ENCOUNTER — Other Ambulatory Visit: Payer: Medicare Other

## 2015-04-05 ENCOUNTER — Ambulatory Visit
Admission: RE | Admit: 2015-04-05 | Discharge: 2015-04-05 | Disposition: A | Discharge status: Home / Self Care | Payer: Medicare Other | Source: Ambulatory Visit | Attending: Nephrology | Admitting: Nephrology

## 2015-04-05 DIAGNOSIS — N183 Chronic kidney disease, stage 3 unspecified: Secondary | ICD-10-CM

## 2015-04-13 ENCOUNTER — Encounter: Payer: Self-pay | Admitting: *Deleted

## 2015-04-19 ENCOUNTER — Encounter: Payer: Self-pay | Admitting: Interventional Cardiology

## 2015-04-19 ENCOUNTER — Ambulatory Visit (INDEPENDENT_AMBULATORY_CARE_PROVIDER_SITE_OTHER): Payer: Medicare Other | Admitting: Interventional Cardiology

## 2015-04-19 VITALS — BP 100/62 | HR 71 | Ht 67.0 in | Wt 165.2 lb

## 2015-04-19 DIAGNOSIS — I10 Essential (primary) hypertension: Secondary | ICD-10-CM | POA: Diagnosis not present

## 2015-04-19 DIAGNOSIS — I2510 Atherosclerotic heart disease of native coronary artery without angina pectoris: Secondary | ICD-10-CM

## 2015-04-19 DIAGNOSIS — I350 Nonrheumatic aortic (valve) stenosis: Secondary | ICD-10-CM

## 2015-04-19 DIAGNOSIS — I739 Peripheral vascular disease, unspecified: Secondary | ICD-10-CM | POA: Diagnosis not present

## 2015-04-19 DIAGNOSIS — E785 Hyperlipidemia, unspecified: Secondary | ICD-10-CM

## 2015-04-19 NOTE — Progress Notes (Signed)
Cardiology Office Note   Date:  04/19/2015   ID:  [place], [place] 1945-11-02, MRN [id number]  PCP:  Ricke Hey, MD  Cardiologist:  Sinclair Grooms, MD   Chief Complaint  Patient presents with  . Coronary Artery Disease      History of Present Illness: Robert Estes is a 70 y.o. male who presents for CAD with prior stent implantation and RCA and circumflex 2010, hypertension, probable bicuspid aortic valve with mild aortic stenosis, and left carotid bruit. Also has history of hyperlipidemia.  Was having palpitations earlier this year. Evaluation did not reveal much. He has some PACs and PVCs on monitor. Echo was repeated and revealed mild aortic stenosis. LV function was normal. He is not having angina, dyspnea, or syncope. Palpitations have resolved with decreasing caffeine and chocolates.  Past Medical History  Diagnosis Date  . Glaucoma     BOTH EYES  . Hypertension   . Enlarged prostate     FLOMAX HELPS PT VOID  . Coronary artery disease     HX OF HEART STENTS X 2   2010--DR. H. SMITH IS PT'S CARDIOLOGIST, MILD AORTIC STENOSIS  . Peripheral vascular disease ([place])     CLAUDICATION  . Dizziness   . Hyperlipidemia   . Heart murmur   . GERD (gastroesophageal reflux disease)     OTC PREVACID AS NEEDED  . Hypertension   . Gout   . Exertional shortness of breath   . Iron deficiency anemia     hx  . Migraine     "monthly" (01/25/2013)  . Arthritis     "hands, knees, elbows, fingers" (01/25/2013)    Past Surgical History  Procedure Laterality Date  . Eus  12/24/2011    Procedure: FULL UPPER ENDOSCOPIC ULTRASOUND (EUS) RADIAL;  Surgeon: [name], MD;  Location: [facility];  Service: Endoscopy;  Laterality: N/A;  . Incision and drainage      thrombosed hemorrhoid  . Laparoscopic cholecystectomy  01/25/2013  . Coronary angioplasty with stent placement  11/27/2008    "2" (01/25/2013)  . Cholecystectomy N/A 01/25/2013    Procedure: LAPAROSCOPIC  CHOLECYSTECTOMY WITH INTRAOPERATIVE CHOLANGIOGRAM;  Surgeon: [name], MD;  Location: [facility];  Service: General;  Laterality: N/A;     Current Outpatient Prescriptions  Medication Sig Dispense Refill  . Ascorbic Acid (VITAMIN C PO) Take 1 tablet by mouth daily.    Marland Kitchen aspirin 81 MG tablet Take 81 mg by mouth daily.    . Coenzyme Q10 (CO Q 10) 10 MG CAPS Take 1 capsule by mouth daily.    . diclofenac (VOLTAREN) 75 MG EC tablet Take 75 mg by mouth 2 (two) times daily.    . hydrOXYzine (ATARAX/VISTARIL) 10 MG tablet Take 5 mg by mouth at bedtime as needed (sleep). For sleep  0  . ibuprofen (ADVIL) 200 MG tablet Take 200 mg by mouth every 4 (four) hours as needed for moderate pain.    Marland Kitchen KRILL OIL 1000 MG CAPS Take 1,000 mg by mouth 2 (two) times daily.    . metoprolol succinate (TOPROL-XL) 50 MG 24 hr tablet Take 1 tablet (50 mg total) by mouth daily. Take with or immediately following a meal. 30 tablet 6  . Multiple Vitamin (MULTIVITAMIN WITH MINERALS) TABS Take 1 tablet by mouth daily. Reported on 03/06/2015    . rosuvastatin (CRESTOR) 5 MG tablet Take 1 tablet (5 mg total) by mouth 4 (four) times a week. 16 tablet 11  .  tamsulosin (FLOMAX) 0.4 MG CAPS capsule Take 0.4 mg by mouth daily.  0  . testosterone cypionate (DEPOTESTOSTERONE CYPIONATE) 200 MG/ML injection Inject 1 mL into the muscle every 14 (fourteen) days.  0  . travoprost, benzalkonium, (TRAVATAN) 0.004 % ophthalmic solution Place 1 drop into both eyes at bedtime.     Marland Kitchen zolpidem (AMBIEN) 10 MG tablet Take 0.5 tablets by mouth at bedtime.     No current facility-administered medications for this visit.    Allergies:   Codeine    Social History:  The patient  reports that he has never smoked. He has never used smokeless tobacco. He reports that he does not drink alcohol or use illicit drugs.   Family History:  The patient's family history includes Breast cancer in his mother; Cancer in his mother; Heart attack in his father;  Heart disease in his father; Heart failure in his mother.    ROS:  Please see the history of present illness.   Otherwise, review of systems are positive for Back pain, leg pain, discomfort and hands..   All other systems are reviewed and negative.    PHYSICAL EXAM: VS:  BP 100/62 mmHg  Pulse 71  Ht 5\' 7"  (1.702 m)  Wt 165 lb 3.2 oz (74.934 kg)  BMI 25.87 kg/m2 , BMI Body mass index is 25.87 kg/(m^2). GEN: Well nourished, well developed, in no acute distress HEENT: normal Neck: no JVD, carotid bruits, or masses Cardiac: RRR.  There is a 2 to 3/6 crescendo decrescendo murmur. There is no rub but an S4  gallop is present. There is none edema. Pulses are 2+ and symmetric in the upper and lower extremities. Respiratory:  clear to auscultation bilaterally, normal work of breathing. GI: soft, nontender, nondistended, + BS MS: no deformity or atrophy Skin: warm and dry, no rash Neuro:  Strength and sensation are intact Psych: euthymic mood, full affect   EKG:  EKG is not ordered today. EKG from 03/06/15 revealed normal sinus rhythm with early repolarization.   Recent Labs: 12/14/2014: ALT 23 03/06/2015: BUN 20; Creat 1.68*; Hemoglobin 14.3; Platelets 175; Potassium 4.7; Sodium 139; TSH 1.55    Lipid Panel    Component Value Date/Time   CHOL 109* 12/14/2014 0901   TRIG 83 12/14/2014 0901   HDL 20* 12/14/2014 0901   CHOLHDL 5.5* 12/14/2014 0901   VLDL 17 12/14/2014 0901   LDLCALC 72 12/14/2014 0901      Wt Readings from Last 3 Encounters:  04/19/15 165 lb 3.2 oz (74.934 kg)  03/06/15 167 lb 12.8 oz (76.114 kg)  07/31/14 165 lb (74.844 kg)      Other studies Reviewed: Additional studies/ records that were reviewed today include: Review of Holter monitor and echocardiogram. The findings include mild aortic stenosis and premature atrial and ventricular contractions respectively were identified..    ASSESSMENT AND PLAN:  1. Coronary artery disease involving native coronary  artery of native heart without angina pectoris Asymptomatic  2. Aortic stenosis Mild in severity based upon recent echo  3. Essential hypertension Excellent control  4. Peripheral vascular disease (H[facility]) Excellent posterior tibial pulses and no specific complaints of claudication  5. Hyperlipidemia On therapy with good lipid is recently, LDL 72.    Current medicines are reviewed at length with the patient today.  The patient has the following concerns regarding medicines: None.  The following changes/actions have been instituted:    Abstain from caffeine  Aerobic activity as much as possible  Notify us of chest  discomfort or dyspnea  Labs/ tests ordered today include:  No orders of the defined types were placed in this encounter.     Disposition:   FU with HS in 1 year  Signed, Sinclair Grooms, MD  04/19/2015 9:01 AM    [facility], [place], [place]  [address] Phone: [phone]; Fax: [phone]

## 2015-04-19 NOTE — Patient Instructions (Signed)

## 2015-04-20 DIAGNOSIS — M24541 Contracture, right hand: Secondary | ICD-10-CM | POA: Insufficient documentation

## 2015-04-20 DIAGNOSIS — M79641 Pain in right hand: Secondary | ICD-10-CM | POA: Insufficient documentation

## 2015-04-20 DIAGNOSIS — M79642 Pain in left hand: Secondary | ICD-10-CM | POA: Insufficient documentation

## 2015-04-26 ENCOUNTER — Encounter: Payer: Self-pay | Admitting: Interventional Cardiology

## 2015-10-24 DIAGNOSIS — R52 Pain, unspecified: Secondary | ICD-10-CM | POA: Insufficient documentation

## 2015-10-25 ENCOUNTER — Other Ambulatory Visit: Payer: Self-pay | Admitting: Orthopedic Surgery

## 2015-10-25 DIAGNOSIS — M25832 Other specified joint disorders, left wrist: Secondary | ICD-10-CM

## 2016-01-08 ENCOUNTER — Ambulatory Visit (INDEPENDENT_AMBULATORY_CARE_PROVIDER_SITE_OTHER): Payer: Medicare Other | Admitting: Physician Assistant

## 2016-01-08 VITALS — BP 124/80 | HR 74 | Temp 97.7°F | Resp 17 | Ht 67.5 in | Wt 171.0 lb

## 2016-01-08 DIAGNOSIS — J029 Acute pharyngitis, unspecified: Secondary | ICD-10-CM | POA: Diagnosis not present

## 2016-01-08 DIAGNOSIS — R0981 Nasal congestion: Secondary | ICD-10-CM

## 2016-01-08 DIAGNOSIS — J209 Acute bronchitis, unspecified: Secondary | ICD-10-CM | POA: Diagnosis not present

## 2016-01-08 MED ORDER — BENZONATATE 100 MG PO CAPS: 100.0000 mg | ORAL_CAPSULE | Freq: Three times a day (TID) | ORAL | 0 refills | Status: DC | PRN

## 2016-01-08 MED ORDER — MUCINEX DM MAXIMUM STRENGTH 60-1200 MG PO TB12: 1.0000 | ORAL_TABLET | Freq: Two times a day (BID) | ORAL | 1 refills | Status: DC

## 2016-01-08 MED ORDER — HYDROCODONE-HOMATROPINE 5-1.5 MG/5ML PO SYRP: 5.0000 mL | ORAL_SOLUTION | Freq: Three times a day (TID) | ORAL | 0 refills | Status: DC | PRN

## 2016-01-08 MED ORDER — AZITHROMYCIN 250 MG PO TABS: ORAL_TABLET | ORAL | 0 refills | Status: DC

## 2016-01-08 NOTE — Progress Notes (Signed)
Robert Estes [facility]  MRN: [id number] DOB: 1945/04/03  PCP: Ricke Hey, MD  Subjective:  Pt is a 70 year old male PMH HTN, glaucoma, HLD, CAD, who presents to clinic for sore throat and nasal congestion x three days.  Increased runny nose and sinus pressure and yesterday he endorses "full blown sore throat". Denies difficulty swallowing. Notes some discomfort with talking. He is nervous because he is going out of town in a few days and does not want to be sick on his trip. No known sick contacts.  Denies fever, chills, chest pain, palpitations, sneezing, nausea, vomiting.  Has tried cough syrup, helped some.   Review of Systems  Constitutional: Negative for chills, diaphoresis and fever.  HENT: Positive for congestion, postnasal drip, rhinorrhea and sore throat. Negative for sinus pain, sinus pressure and sneezing.   Respiratory: Negative for cough, chest tightness, shortness of breath and wheezing.   Cardiovascular: Negative for chest pain, palpitations and leg swelling.  Gastrointestinal: Negative for abdominal pain, diarrhea, nausea and vomiting.  Musculoskeletal: Negative for neck pain.  Neurological: Negative for dizziness, syncope, light-headedness and headaches.  Psychiatric/Behavioral: Positive for sleep disturbance. The patient is not nervous/anxious.     Patient Active Problem List   Diagnosis Date Noted  . Aortic stenosis 03/10/2014  . Left carotid bruit 03/10/2014  . Glaucoma   . Hypertension   . Enlarged prostate   . Coronary artery disease   . Peripheral vascular disease ([place])   . Dizziness   . Hyperlipidemia   . GERD (gastroesophageal reflux disease)   . Gout   . Exertional shortness of breath   . Iron deficiency anemia   . Migraine   . Arthritis   . S/P laparoscopic cholecystectomy 01/25/2013  . Thrombosed external hemorrhoid 03/25/2012    Current Outpatient Prescriptions on File Prior to Visit  Medication Sig Dispense Refill  . Ascorbic Acid (VITAMIN  C PO) Take 1 tablet by mouth daily.    Marland Kitchen aspirin 81 MG tablet Take 81 mg by mouth daily.    . Coenzyme Q10 (CO Q 10) 10 MG CAPS Take 1 capsule by mouth daily.    . diclofenac (VOLTAREN) 75 MG EC tablet Take 75 mg by mouth 2 (two) times daily.    . hydrOXYzine (ATARAX/VISTARIL) 10 MG tablet Take 5 mg by mouth at bedtime as needed (sleep). For sleep  0  . ibuprofen (ADVIL) 200 MG tablet Take 200 mg by mouth every 4 (four) hours as needed for moderate pain.    Marland Kitchen KRILL OIL 1000 MG CAPS Take 1,000 mg by mouth 2 (two) times daily.    . metoprolol succinate (TOPROL-XL) 50 MG 24 hr tablet Take 1 tablet (50 mg total) by mouth daily. Take with or immediately following a meal. 30 tablet 6  . Multiple Vitamin (MULTIVITAMIN WITH MINERALS) TABS Take 1 tablet by mouth daily. Reported on 03/06/2015    . rosuvastatin (CRESTOR) 5 MG tablet Take 1 tablet (5 mg total) by mouth 4 (four) times a week. 16 tablet 11  . tamsulosin (FLOMAX) 0.4 MG CAPS capsule Take 0.4 mg by mouth daily.  0  . testosterone cypionate (DEPOTESTOSTERONE CYPIONATE) 200 MG/ML injection Inject 1 mL into the muscle every 14 (fourteen) days.  0  . travoprost, benzalkonium, (TRAVATAN) 0.004 % ophthalmic solution Place 1 drop into both eyes at bedtime.     Marland Kitchen zolpidem (AMBIEN) 10 MG tablet Take 0.5 tablets by mouth at bedtime.     No current facility-administered medications on  file prior to visit.     Allergies  Allergen Reactions  . Codeine Itching     Objective:  BP 124/80 (BP Location: Right Arm, Patient Position: Sitting, Cuff Size: Normal)   Pulse 74   Temp 97.7 F (36.5 C) (Oral)   Resp 17   Ht 5' 7.5" (1.715 m)   Wt 171 lb (77.6 kg)   SpO2 98%   BMI 26.39 kg/m   Physical Exam  Constitutional: He is oriented to person, place, and time and well-developed, well-nourished, and in no distress. No distress.  HENT:  Right Ear: Tympanic membrane normal.  Left Ear: Tympanic membrane normal.  Mouth/Throat: Oropharynx is clear and  moist and mucous membranes are normal.  Eyes: Conjunctivae are normal.  Neck: Normal range of motion. Neck supple.  Cardiovascular: Normal rate, regular rhythm and normal heart sounds.   Pulmonary/Chest: Effort normal and breath sounds normal. No respiratory distress. He has no wheezes. He has no rales.  Neurological: He is alert and oriented to person, place, and time. GCS score is 15.  Skin: Skin is warm and dry.  Psychiatric: Mood, memory, affect and judgment normal.  Vitals reviewed.   Assessment and Plan :  1. Acute bronchitis, unspecified organism 2. Nasal congestion 3. Sore throat - Dextromethorphan-Guaifenesin (MUCINEX DM MAXIMUM STRENGTH) 60-1200 MG TB12; Take 1 tablet by mouth every 12 (twelve) hours.  Dispense: 14 each; Refill: 1 - HYDROcodone-homatropine (HYCODAN) 5-1.5 MG/5ML syrup; Take 5 mLs by mouth every 8 (eight) hours as needed for cough.  Dispense: 120 mL; Refill: 0 - benzonatate (TESSALON) 100 MG capsule; Take 1-2 capsules (100-200 mg total) by mouth 3 (three) times daily as needed for cough.  Dispense: 40 capsule; Refill: 0 - azithromycin (ZITHROMAX) 250 MG tablet; Take 2 tabs PO x 1 dose, then 1 tab PO QD x 4 days  Dispense: 6 tablet; Refill: 0 - Supportive care discussed with patient: Fluids and rest. Discussed at length with patient antibiotic resistance and treatment with antibiotic therapy is not indicated at this time. Advised patient to fill antibiotic to take on his trip, only start taking it if he is not improving in five days or if symptoms worsen. He understands and agrees.   Mercer Pod, PA-C  [facility] 01/08/2016 5:55 PM

## 2016-01-08 NOTE — Patient Instructions (Addendum)
Hot tea with slices of lemon, honey, slices of ginger and cloves. Please drink plenty of fluids while you are getting better.  Do not fill your antibiotics until you are about to leave for your trip.   Thank you for coming in today. I hope you feel we met your needs.  Feel free to call [facility] if you have any questions or further requests.  Please consider signing up for MyChart if you do not already have it, as this is a great way to communicate with me.  Best,  Whitney McVey, PA-C    IF you received an x-ray today, you will receive an invoice from [facility]. Please contact [facility] at [phone] with questions or concerns regarding your invoice.   IF you received labwork today, you will receive an invoice from [facility]. Please contact [facility] at [phone] with questions or concerns regarding your invoice.   Our billing staff will not be able to assist you with questions regarding bills from these companies.  You will be contacted with the lab results as soon as they are available. The fastest way to get your results is to activate your My Chart account. Instructions are located on the last page of this paperwork. If you have not heard from us regarding the results in 2 weeks, please contact this office.      

## 2016-04-28 ENCOUNTER — Other Ambulatory Visit: Payer: Self-pay | Admitting: Interventional Cardiology

## 2016-04-28 DIAGNOSIS — E785 Hyperlipidemia, unspecified: Secondary | ICD-10-CM
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
## 2016-06-16 ENCOUNTER — Encounter: Payer: Self-pay | Admitting: Cardiology

## 2016-06-16 ENCOUNTER — Encounter (INDEPENDENT_AMBULATORY_CARE_PROVIDER_SITE_OTHER): Payer: Self-pay

## 2016-06-16 ENCOUNTER — Ambulatory Visit (INDEPENDENT_AMBULATORY_CARE_PROVIDER_SITE_OTHER): Payer: Medicare Other | Admitting: Cardiology

## 2016-06-16 VITALS — BP 118/70 | HR 70 | Ht 66.0 in | Wt 163.1 lb

## 2016-06-16 DIAGNOSIS — I2510 Atherosclerotic heart disease of native coronary artery without angina pectoris: Secondary | ICD-10-CM | POA: Diagnosis not present

## 2016-06-16 DIAGNOSIS — E785 Hyperlipidemia, unspecified: Secondary | ICD-10-CM

## 2016-06-16 DIAGNOSIS — Z79899 Other long term (current) drug therapy: Secondary | ICD-10-CM | POA: Diagnosis not present

## 2016-06-16 DIAGNOSIS — R079 Chest pain, unspecified: Secondary | ICD-10-CM | POA: Diagnosis not present

## 2016-06-16 DIAGNOSIS — I350 Nonrheumatic aortic (valve) stenosis: Secondary | ICD-10-CM | POA: Diagnosis not present

## 2016-06-16 MED ORDER — METOPROLOL SUCCINATE ER 25 MG PO TB24: 25.0000 mg | ORAL_TABLET | Freq: Every day | ORAL | 11 refills | Status: DC

## 2016-06-16 MED ORDER — ROSUVASTATIN CALCIUM 5 MG PO TABS: ORAL_TABLET | ORAL | 11 refills | Status: DC

## 2016-06-16 NOTE — Patient Instructions (Signed)
Medication Instructions:  The current medical regimen is effective;  continue present plan and medications.  Labwork: Please have fasting blood work on the same day as your stress testing.  Testing/Procedures: Your physician has requested that you have a  myoview. For further information please visit [email or web address]. Please follow instruction sheet, as given.  Follow-Up: Follow up in 6 months with Dr. Tamala Julian.  You will receive a letter in the mail 2 months before you are due.  Please call us when you receive this letter to schedule your follow up appointment.  If you need a refill on your cardiac medications before your next appointment, please call your pharmacy.  Thank you for choosing [facility]!!

## 2016-06-17 ENCOUNTER — Telehealth (HOSPITAL_COMMUNITY): Payer: Self-pay | Admitting: *Deleted

## 2016-06-17 NOTE — Telephone Encounter (Signed)
Left message on voicemail in reference to upcoming appointment scheduled for 06/19/16. Phone number given for a call back so details instructions can be given. Rylen Swindler, Ranae [place]ms

## 2016-06-18 ENCOUNTER — Telehealth (HOSPITAL_COMMUNITY): Payer: Self-pay | Admitting: *Deleted

## 2016-06-18 NOTE — Telephone Encounter (Signed)
Patient given detailed instructions per Myocardial Perfusion Study Information Sheet for the test on 06/19/16 Patient notified to arrive 15 minutes early and that it is imperative to arrive on time for appointment to keep from having the test rescheduled.  If you need to cancel or reschedule your appointment, please call the office within 24 hours of your appointment. . Patient verbalized understanding. Kirstie Peri

## 2016-06-19 ENCOUNTER — Other Ambulatory Visit: Payer: Medicare Other | Admitting: *Deleted

## 2016-06-19 ENCOUNTER — Ambulatory Visit (HOSPITAL_COMMUNITY): Payer: Medicare Other | Attending: Cardiovascular Disease

## 2016-06-19 DIAGNOSIS — R079 Chest pain, unspecified: Secondary | ICD-10-CM | POA: Diagnosis present

## 2016-06-19 DIAGNOSIS — Z79899 Other long term (current) drug therapy: Secondary | ICD-10-CM

## 2016-06-19 DIAGNOSIS — E785 Hyperlipidemia, unspecified: Secondary | ICD-10-CM

## 2016-06-19 DIAGNOSIS — I2510 Atherosclerotic heart disease of native coronary artery without angina pectoris: Secondary | ICD-10-CM | POA: Insufficient documentation

## 2016-06-19 DIAGNOSIS — I10 Essential (primary) hypertension: Secondary | ICD-10-CM | POA: Diagnosis not present

## 2016-06-19 LAB — LIPID PANEL
Chol/HDL Ratio: 5.1 ratio — ABNORMAL HIGH (ref 0.0–5.0)
Cholesterol, Total: 144 mg/dL (ref 100–199)
HDL: 28 mg/dL — ABNORMAL LOW (ref >39)
LDL Calculated: 98 mg/dL (ref 0–99)
Triglycerides: 90 mg/dL (ref 0–149)
VLDL Cholesterol Cal: 18 mg/dL (ref 5–40)

## 2016-06-19 LAB — COMPREHENSIVE METABOLIC PANEL
ALT: 17 IU/L (ref 0–44)
AST: 29 IU/L (ref 0–40)
Albumin/Globulin Ratio: 2 (ref 1.2–2.2)
Albumin: 4.2 g/dL (ref 3.6–4.8)
Alkaline Phosphatase: 57 IU/L (ref 39–117)
BUN/Creatinine Ratio: 11 (ref 10–24)
BUN: 17 mg/dL (ref 8–27)
Bilirubin Total: 0.5 mg/dL (ref 0.0–1.2)
CO2: 20 mmol/L (ref 18–29)
Calcium: 8.7 mg/dL (ref 8.6–10.2)
Chloride: 103 mmol/L (ref 96–106)
Creatinine, Ser: 1.51 mg/dL — ABNORMAL HIGH (ref 0.76–1.27)
GFR calc Af Amer: 54 mL/min/{1.73_m2} — ABNORMAL LOW (ref >59)
GFR calc non Af Amer: 47 mL/min/{1.73_m2} — ABNORMAL LOW (ref >59)
Globulin, Total: 2.1 g/dL (ref 1.5–4.5)
Glucose: 91 mg/dL (ref 65–99)
Potassium: 4.6 mmol/L (ref 3.5–5.2)
Sodium: 139 mmol/L (ref 134–144)
Total Protein: 6.3 g/dL (ref 6.0–8.5)

## 2016-06-19 LAB — MYOCARDIAL PERFUSION IMAGING
Estimated workload: 7.9 METS
Exercise duration (min): 6 min
Exercise duration (sec): 35 s
LV dias vol: 95 mL (ref 62–150)
LV sys vol: 43 mL
MPHR: 152 {beats}/min
Peak HR: 137 {beats}/min
Percent HR: 90 %
RATE: 0.34
RPE: 18
Rest HR: 65 {beats}/min
SDS: 0
SRS: 0
SSS: 0
TID: 0.88

## 2016-06-19 MED ORDER — TECHNETIUM TC 99M TETROFOSMIN IV KIT
10.5000 | PACK | Freq: Once | INTRAVENOUS | Status: AC | PRN
  Administered 2016-06-19: 10.5 via INTRAVENOUS
  Filled 2016-06-19: qty 11

## 2016-06-19 MED ORDER — TECHNETIUM TC 99M TETROFOSMIN IV KIT
31.7000 | PACK | Freq: Once | INTRAVENOUS | Status: AC | PRN
  Administered 2016-06-19: 31.7 via INTRAVENOUS
  Filled 2016-06-19: qty 32

## 2016-06-20 ENCOUNTER — Telehealth: Payer: Self-pay | Admitting: *Deleted

## 2016-06-20 DIAGNOSIS — E785 Hyperlipidemia, unspecified: Secondary | ICD-10-CM

## 2016-06-20 MED ORDER — ROSUVASTATIN CALCIUM 5 MG PO TABS: ORAL_TABLET | ORAL | 11 refills | Status: DC

## 2016-06-20 NOTE — Telephone Encounter (Signed)
-----   Message from Isaiah Serge, NP sent at 06/19/2016 11:23 PM EDT ----- See if he could increase crestor to 5 days a week.

## 2016-08-22 ENCOUNTER — Emergency Department (HOSPITAL_COMMUNITY)
Admission: EM | Admit: 2016-08-22 | Discharge: 2016-08-22 | Disposition: A | Discharge status: Home / Self Care | Payer: Medicare Other | Attending: Emergency Medicine | Admitting: Emergency Medicine

## 2016-08-22 ENCOUNTER — Encounter (HOSPITAL_COMMUNITY): Payer: Self-pay | Admitting: Emergency Medicine

## 2016-08-22 DIAGNOSIS — Z7982 Long term (current) use of aspirin: Secondary | ICD-10-CM | POA: Diagnosis not present

## 2016-08-22 DIAGNOSIS — S0990XA Unspecified injury of head, initial encounter: Secondary | ICD-10-CM | POA: Insufficient documentation

## 2016-08-22 DIAGNOSIS — Y929 Unspecified place or not applicable: Secondary | ICD-10-CM | POA: Insufficient documentation

## 2016-08-22 DIAGNOSIS — Y939 Activity, unspecified: Secondary | ICD-10-CM | POA: Diagnosis not present

## 2016-08-22 DIAGNOSIS — Y999 Unspecified external cause status: Secondary | ICD-10-CM | POA: Insufficient documentation

## 2016-08-22 DIAGNOSIS — I2510 Atherosclerotic heart disease of native coronary artery without angina pectoris: Secondary | ICD-10-CM | POA: Diagnosis not present

## 2016-08-22 DIAGNOSIS — I10 Essential (primary) hypertension: Secondary | ICD-10-CM | POA: Insufficient documentation

## 2016-08-22 DIAGNOSIS — Z79899 Other long term (current) drug therapy: Secondary | ICD-10-CM | POA: Insufficient documentation

## 2016-08-22 DIAGNOSIS — X58XXXA Exposure to other specified factors, initial encounter: Secondary | ICD-10-CM | POA: Diagnosis not present

## 2016-08-22 DIAGNOSIS — Z955 Presence of coronary angioplasty implant and graft: Secondary | ICD-10-CM | POA: Insufficient documentation

## 2016-08-22 NOTE — ED Triage Notes (Signed)
Pt reports he hit his forehead on the back of a hatch back car yesterday. Not on blood thinners. No LOC. Pt reports today he had an episode of stuttering which made him want to seek evaluation. No speech difficulty in triage.

## 2016-08-22 NOTE — ED Provider Notes (Signed)
[facility] DEPT Provider Note   CSN: [id number] Arrival date & time: 08/22/16  1401     History   Chief Complaint Chief Complaint  Patient presents with  . Head Injury    HPI Robert Estes is a 71 y.o. male.  The history is provided by the patient.  Head Injury   The incident occurred yesterday. He came to the ER via walk-in. The injury mechanism was a direct blow. There was no loss of consciousness. There was no blood loss. The quality of the pain is described as dull. The pain is mild. Pertinent negatives include no numbness, no blurred vision, no vomiting, no disorientation, no weakness and no memory loss. He has tried nothing for the symptoms.   Reports that he had a brief, 3 sec episode where he forgot his words today, which prompted his ED visit today. No focal deficits, speech difficulty, visual disturbance.   Past Medical History:  Diagnosis Date  . Arthritis    "hands, knees, elbows, fingers" (01/25/2013)  . Coronary artery disease    HX OF HEART STENTS X 2   2010--DR. H. SMITH IS PT'S CARDIOLOGIST, MILD AORTIC STENOSIS  . Dizziness   . Enlarged prostate    FLOMAX HELPS PT VOID  . Exertional shortness of breath   . GERD (gastroesophageal reflux disease)    OTC PREVACID AS NEEDED  . Glaucoma    BOTH EYES  . Gout   . Heart murmur   . Hyperlipidemia   . Hypertension   . Hypertension   . Iron deficiency anemia    hx  . Migraine    "monthly" (01/25/2013)  . Peripheral vascular disease ([place])    CLAUDICATION    Patient Active Problem List   Diagnosis Date Noted  . Aortic stenosis 03/10/2014  . Left carotid bruit 03/10/2014  . Glaucoma   . Hypertension   . Enlarged prostate   . Coronary artery disease   . Peripheral vascular disease ([place])   . Dizziness   . Hyperlipidemia   . GERD (gastroesophageal reflux disease)   . Gout   . Exertional shortness of breath   . Iron deficiency anemia   . Migraine   . Arthritis   . S/P laparoscopic  cholecystectomy 01/25/2013  . Thrombosed external hemorrhoid 03/25/2012    Past Surgical History:  Procedure Laterality Date  . CHOLECYSTECTOMY N/A 01/25/2013   Procedure: LAPAROSCOPIC CHOLECYSTECTOMY WITH INTRAOPERATIVE CHOLANGIOGRAM;  Surgeon: [name], MD;  Location: [facility];  Service: General;  Laterality: N/A;  . CORONARY ANGIOPLASTY WITH STENT PLACEMENT  11/27/2008   "2" (01/25/2013)  . EUS  12/24/2011   Procedure: FULL UPPER ENDOSCOPIC ULTRASOUND (EUS) RADIAL;  Surgeon: [name], MD;  Location: [facility];  Service: Endoscopy;  Laterality: N/A;  . INCISION AND DRAINAGE     thrombosed hemorrhoid  . LAPAROSCOPIC CHOLECYSTECTOMY  01/25/2013       Home Medications    Prior to Admission medications   Medication Sig Start Date End Date Taking? Authorizing Provider  Ascorbic Acid (VITAMIN C PO) Take 1 tablet by mouth daily.   Yes [provider]  aspirin 81 MG tablet Take 81 mg by mouth daily.   Yes [provider]  hydrOXYzine (ATARAX/VISTARIL) 10 MG tablet Take 5 mg by mouth at bedtime as needed (sleep). For sleep 12/02/13  Yes [provider]  ibuprofen (ADVIL) 200 MG tablet Take 200 mg by mouth every 4 (four) hours as needed for moderate pain.   Yes [provider]  metoprolol succinate (TOPROL-XL) 50 MG 24 hr tablet Take 25 mg by mouth daily.  08/14/16  Yes [provider]  Multiple Vitamin (MULTIVITAMIN WITH MINERALS) TABS Take 1 tablet by mouth daily. Reported on 03/06/2015   Yes [provider]  rosuvastatin (CRESTOR) 5 MG tablet TAKE 1 TABLET BY MOUTH 5 TIMES A WEEK Patient taking differently: Take 5 mg by mouth. TAKE 1 TABLET BY MOUTH 4 TIMES A WEEK 06/20/16  Yes Isaiah Serge, NP  tamsulosin (FLOMAX) 0.4 MG CAPS capsule Take 0.8 mg by mouth daily.  02/24/14  Yes [provider]  testosterone cypionate (DEPOTESTOSTERONE CYPIONATE) 200 MG/ML injection Inject 1 mL into the muscle every 14 (fourteen) days.  07/04/14  Yes [provider]  travoprost, benzalkonium, (TRAVATAN) 0.004 % ophthalmic solution Place 1 drop into both eyes at bedtime.    Yes [provider]  zolpidem (AMBIEN) 10 MG tablet Take 0.5 tablets by mouth at bedtime. 12/13/12  Yes [provider]  benzonatate (TESSALON) 100 MG capsule Take 1-2 capsules (100-200 mg total) by mouth 3 (three) times daily as needed for cough. Patient not taking: Reported on 08/22/2016 01/08/16   McVey, Gelene Mink, PA-C  Dextromethorphan-Guaifenesin [facility] DM MAXIMUM STRENGTH) 60-1200 MG TB12 Take 1 tablet by mouth every 12 (twelve) hours. Patient not taking: Reported on 08/22/2016 01/08/16   McVey, Gelene Mink, PA-C  HYDROcodone-homatropine [facility]) 5-1.5 MG/5ML syrup Take 5 mLs by mouth every 8 (eight) hours as needed for cough. Patient not taking: Reported on 08/22/2016 01/08/16   McVey, Gelene Mink, PA-C  metoprolol succinate (TOPROL-XL) 25 MG 24 hr tablet Take 1 tablet (25 mg total) by mouth daily. Take with or immediately following a meal. Patient not taking: Reported on 08/22/2016 06/16/16   Isaiah Serge, NP    Family History Family History  Problem Relation Age of Onset  . Breast cancer Mother   . Cancer Mother   . Heart disease Father   . Heart failure Mother   . Heart attack Father     Social History Social History  Substance Use Topics  . Smoking status: Never Smoker  . Smokeless tobacco: Never Used  . Alcohol use No     Allergies   Codeine   Review of Systems Review of Systems  Eyes: Negative for blurred vision.  Gastrointestinal: Negative for vomiting.  Neurological: Negative for weakness and numbness.  Psychiatric/Behavioral: Negative for memory loss.  All other systems are reviewed and are negative for acute change except as noted in the HPI    Physical Exam Updated Vital Signs BP 112/69   Pulse 69   Temp 98.6 F (37 C)   Resp 15   SpO2 98%   Physical Exam    Constitutional: He is oriented to person, place, and time. He appears well-developed and well-nourished. No distress.  HENT:  Head: Normocephalic and atraumatic.  Nose: Nose normal.  Eyes: Pupils are equal, round, and reactive to light. Conjunctivae and EOM are normal. Right eye exhibits no discharge. Left eye exhibits no discharge. No scleral icterus.  Neck: Normal range of motion. Neck supple.  Cardiovascular: Normal rate and regular rhythm.  Exam reveals no gallop and no friction rub.   No murmur heard. Pulmonary/Chest: Effort normal and breath sounds normal. No stridor. No respiratory distress. He has no rales.  Abdominal: Soft. He exhibits no distension. There is no tenderness.  Musculoskeletal: He exhibits no edema or tenderness.  Neurological: He is alert and oriented to person, place,  and time.  Mental Status: Alert and oriented to person, place, and time. Attention and concentration normal. Speech clear. Recent memory is intact  Cranial Nerves  II Visual Fields: Intact to confrontation. Visual fields intact. III, IV, VI: Pupils equal and reactive to light and near. Full eye movement without nystagmus  V Facial Sensation: Normal. No weakness of masticatory muscles  VII: No facial weakness or asymmetry  VIII Auditory Acuity: Grossly normal  IX/X: The uvula is midline; the palate elevates symmetrically  XI: Normal sternocleidomastoid and trapezius strength  XII: The tongue is midline. No atrophy or fasciculations.   Motor System: Muscle Strength: 5/5 and symmetric in the upper and lower extremities. No pronation or drift.  Muscle Tone: Tone and muscle bulk are normal in the upper and lower extremities.   Reflexes: DTRs: 1+ and symmetrical in all four extremities. Plantar responses are flexor bilaterally.  Coordination: Intact finger-to-nose, heel-to-shin. No tremor.  Sensation: Intact to light touch, and pinprick. Gait: Routine gait normal.   Skin: Skin is warm and dry. No rash  noted. He is not diaphoretic. No erythema.  Psychiatric: He has a normal mood and affect.  Vitals reviewed.    ED Treatments / Results  Labs (all labs ordered are listed, but only abnormal results are displayed) Labs Reviewed - No data to display  EKG  EKG Interpretation None       Radiology No results found.  Procedures Procedures (including critical care time)  Medications Ordered in ED Medications - No data to display   Initial Impression / Assessment and Plan / ED Course  I have reviewed the triage vital signs and the nursing notes.  Pertinent labs & imaging results that were available during my care of the patient were reviewed by me and considered in my medical decision making (see chart for details).     Minor closed head injury. Normal neuro exam. no anticoagulation. no indication for imaging at this time.   Doubt intracranial bleed or skull fracture.   Will monitor in the ED for any changes.  The patient is appropriate for discharge home.  Pt and family given information on head trauma including strict instructions to return for nausea/vomiting, confusion, altered level of consciousness or new weakness. Pt and family understand and are agreeable to the plan.    Final Clinical Impressions(s) / ED Diagnoses   Final diagnoses:  Minor head injury, initial encounter   Disposition: Discharge  Condition: Good  I have discussed the results, Dx and Tx plan with the patient who expressed understanding and agree(s) with the plan. Discharge instructions discussed at great length. The patient was given strict return precautions who verbalized understanding of the instructions. No further questions at time of discharge.    New Prescriptions   No medications on file    Follow Up: Ricke Hey, MD [place] [address] [phone]  Schedule an appointment as soon as possible for a visit  As needed      Fatima Blank, MD 08/22/16 [phone]

## 2016-08-28 ENCOUNTER — Telehealth: Payer: Self-pay | Admitting: Interventional Cardiology

## 2016-08-28 NOTE — Telephone Encounter (Signed)
New message    Lelon Frohlich calling per patient request can fish oil be called in to the pharmacy or should he get the medication over the counter

## 2016-08-28 NOTE — Telephone Encounter (Signed)
Pts wife (on [place])  is calling to ask if we could call the pt in some fish oil to his pharmacy.  Wife states pt instructed her to call in and ask this.  Wife states that to her knowledge, Dr Tamala Julian did not order for the pt to take fish oil daily.  Checked the pts current med list and last lipid taken, and all Dr. Tamala Julian advised for the pt to do is to take his Crestor 5 mg po 5 times a week.  No order for fish oil provided by Dr. Tamala Julian, or any other provider in the pts chart. Advised the pts wife that the pt should not take fish oil unless otherwise advised by a Provider.  Wife verbalized understanding and agrees with this plan. Wife states she will endorse to the pt that he doesn't need fish oil at this time, for it has never been ordered by his MD's.

## 2016-11-15 ENCOUNTER — Emergency Department (HOSPITAL_COMMUNITY)
Admission: EM | Admit: 2016-11-15 | Discharge: 2016-11-15 | Disposition: A | Discharge status: Home / Self Care | Payer: Medicare Other | Attending: Emergency Medicine | Admitting: Emergency Medicine

## 2016-11-15 ENCOUNTER — Emergency Department (HOSPITAL_COMMUNITY): Payer: Medicare Other

## 2016-11-15 ENCOUNTER — Encounter (HOSPITAL_COMMUNITY): Payer: Self-pay | Admitting: Emergency Medicine

## 2016-11-15 DIAGNOSIS — I10 Essential (primary) hypertension: Secondary | ICD-10-CM | POA: Diagnosis not present

## 2016-11-15 DIAGNOSIS — Z9861 Coronary angioplasty status: Secondary | ICD-10-CM | POA: Insufficient documentation

## 2016-11-15 DIAGNOSIS — M25562 Pain in left knee: Secondary | ICD-10-CM | POA: Diagnosis present

## 2016-11-15 DIAGNOSIS — M25462 Effusion, left knee: Secondary | ICD-10-CM | POA: Insufficient documentation

## 2016-11-15 DIAGNOSIS — Z79899 Other long term (current) drug therapy: Secondary | ICD-10-CM | POA: Diagnosis not present

## 2016-11-15 DIAGNOSIS — I2510 Atherosclerotic heart disease of native coronary artery without angina pectoris: Secondary | ICD-10-CM | POA: Insufficient documentation

## 2016-11-15 DIAGNOSIS — Z7982 Long term (current) use of aspirin: Secondary | ICD-10-CM | POA: Insufficient documentation

## 2016-11-15 DIAGNOSIS — I739 Peripheral vascular disease, unspecified: Secondary | ICD-10-CM | POA: Insufficient documentation

## 2016-11-15 LAB — GRAM STAIN

## 2016-11-15 MED ORDER — IBUPROFEN 200 MG PO TABS
600.0000 mg | ORAL_TABLET | Freq: Once | ORAL | Status: AC
  Administered 2016-11-15: 600 mg via ORAL
  Filled 2016-11-15: qty 3

## 2016-11-15 MED ORDER — HYDROCODONE-ACETAMINOPHEN 5-325 MG PO TABS
1.0000 | ORAL_TABLET | ORAL | Status: AC
  Administered 2016-11-15: 1 via ORAL
  Filled 2016-11-15: qty 1

## 2016-11-15 MED ORDER — LIDOCAINE-EPINEPHRINE (PF) 2 %-1:200000 IJ SOLN
30.0000 mL | Freq: Once | INTRAMUSCULAR | Status: AC
  Administered 2016-11-15: 30 mL
  Filled 2016-11-15: qty 40

## 2016-11-15 MED ORDER — TRAMADOL HCL 50 MG PO TABS: 50.0000 mg | ORAL_TABLET | Freq: Four times a day (QID) | ORAL | 0 refills | Status: DC | PRN

## 2016-11-15 NOTE — ED Provider Notes (Signed)
L knee joint aspiration per request of Dr. Hillard Danker.  .Joint Aspiration/Arthrocentesis Date/Time: 11/15/2016 9:15 AM Performed by: Domenic Moras Authorized by: Domenic Moras   Consent:    Consent obtained:  Verbal and written   Consent given by:  Patient   Risks discussed:  Bleeding, pain and infection   Alternatives discussed:  No treatment and alternative treatment Location:    Location:  Knee   Knee:  L knee Anesthesia (see MAR for exact dosages):    Anesthesia method:  Local infiltration   Local anesthetic:  Lidocaine 2% WITH epi Procedure details:    Preparation: Patient was prepped and draped in usual sterile fashion     Needle gauge:  23 G   Ultrasound guidance: no     Approach:  Medial   Aspirate amount:  10cc   Aspirate characteristics:  Blood-tinged   Steroid injected: no     Specimen collected: yes   Post-procedure details:    Dressing:  Adhesive bandage   Patient tolerance of procedure:  Tolerated well, no immediate complications   Domenic Moras, PA-C 11/15/16 3220    Dorie Rank, MD 11/15/16 [phone]

## 2016-11-15 NOTE — Discharge Instructions (Signed)
Rest your leg, use the crutches for support, follow up with an orthopedic doctor for further evaluation, monitor for fever, worsening symptoms

## 2016-11-15 NOTE — ED Provider Notes (Signed)
[facility] DEPT Provider Note   CSN: [id number] Arrival date & time: 11/15/16  0631     History   Chief Complaint Chief Complaint  Patient presents with  . Leg Injury    HPI BLADIMIR AUMAN is a 71 y.o. male.  HPI Patient presents to the emergency room for evaluation of left knee pain. Patient was at work on Thursday. He was walking when he pivoted on his left leg. Patient felt like he twisted his knee.  Since that time he's had progressive pain and swelling in his left knee. He is now having sharp pain in his left knee and feels like it's swollen.it is very difficult for him to walk or put any weight on his knee at all now. He denies any fevers or chills. No other injuries. Past Medical History:  Diagnosis Date  . Arthritis    "hands, knees, elbows, fingers" (01/25/2013)  . Coronary artery disease    HX OF HEART STENTS X 2   2010--DR. H. SMITH IS PT'S CARDIOLOGIST, MILD AORTIC STENOSIS  . Dizziness   . Enlarged prostate    FLOMAX HELPS PT VOID  . Exertional shortness of breath   . GERD (gastroesophageal reflux disease)    OTC PREVACID AS NEEDED  . Glaucoma    BOTH EYES  . Gout   . Heart murmur   . Hyperlipidemia   . Hypertension   . Hypertension   . Iron deficiency anemia    hx  . Migraine    "monthly" (01/25/2013)  . Peripheral vascular disease ([place])    CLAUDICATION    Patient Active Problem List   Diagnosis Date Noted  . Aortic stenosis 03/10/2014  . Left carotid bruit 03/10/2014  . Glaucoma   . Hypertension   . Enlarged prostate   . Coronary artery disease   . Peripheral vascular disease ([place])   . Dizziness   . Hyperlipidemia   . GERD (gastroesophageal reflux disease)   . Gout   . Exertional shortness of breath   . Iron deficiency anemia   . Migraine   . Arthritis   . S/P laparoscopic cholecystectomy 01/25/2013  . Thrombosed external hemorrhoid 03/25/2012    Past Surgical History:  Procedure Laterality Date  .  CHOLECYSTECTOMY N/A 01/25/2013   Procedure: LAPAROSCOPIC CHOLECYSTECTOMY WITH INTRAOPERATIVE CHOLANGIOGRAM;  Surgeon: [name], MD;  Location: [facility];  Service: General;  Laterality: N/A;  . CORONARY ANGIOPLASTY WITH STENT PLACEMENT  11/27/2008   "2" (01/25/2013)  . EUS  12/24/2011   Procedure: FULL UPPER ENDOSCOPIC ULTRASOUND (EUS) RADIAL;  Surgeon: [name], MD;  Location: [facility];  Service: Endoscopy;  Laterality: N/A;  . INCISION AND DRAINAGE     thrombosed hemorrhoid  . LAPAROSCOPIC CHOLECYSTECTOMY  01/25/2013       Home Medications    Prior to Admission medications   Medication Sig Start Date End Date Taking? Authorizing Provider  acetaminophen (TYLENOL) 500 MG tablet Take 1,500 mg by mouth every 6 (six) hours as needed for moderate pain.   Yes [provider]  Ascorbic Acid (VITAMIN C PO) Take 1 tablet by mouth daily.   Yes [provider]  aspirin 81 MG tablet Take 81 mg by mouth daily.   Yes [provider]  HYDROcodone-acetaminophen (NORCO/VICODIN) 5-325 MG tablet Take 1 tablet by mouth every 4 (four) hours as needed for moderate pain.   Yes [provider]  hydrOXYzine (ATARAX/VISTARIL) 10 MG tablet Take 5 mg by mouth at bedtime as  needed (sleep). For sleep 12/02/13  Yes [provider]  ibuprofen (ADVIL) 200 MG tablet Take 200 mg by mouth every 4 (four) hours as needed for moderate pain.   Yes [provider]  metoprolol succinate (TOPROL-XL) 25 MG 24 hr tablet Take 1 tablet (25 mg total) by mouth daily. Take with or immediately following a meal. 06/16/16  Yes Isaiah Serge, NP  metoprolol succinate (TOPROL-XL) 50 MG 24 hr tablet Take 25 mg by mouth daily.  08/14/16  Yes [provider]  Multiple Vitamin (MULTIVITAMIN WITH MINERALS) TABS Take 1 tablet by mouth daily. Reported on 03/06/2015   Yes [provider]  rosuvastatin (CRESTOR) 5 MG tablet TAKE 1 TABLET BY MOUTH 5 TIMES A WEEK Patient  taking differently: Take 5 mg by mouth. TAKE 1 TABLET BY MOUTH 4 TIMES A WEEK 06/20/16  Yes Isaiah Serge, NP  tamsulosin (FLOMAX) 0.4 MG CAPS capsule Take 0.8 mg by mouth at bedtime.  02/24/14  Yes [provider]  testosterone cypionate (DEPOTESTOSTERONE CYPIONATE) 200 MG/ML injection Inject 1 mL into the muscle every 14 (fourteen) days. 07/04/14  Yes [provider]  travoprost, benzalkonium, (TRAVATAN) 0.004 % ophthalmic solution Place 1 drop into both eyes at bedtime.    Yes [provider]  zolpidem (AMBIEN) 10 MG tablet Take 0.5 tablets by mouth at bedtime as needed for sleep.  12/13/12  Yes [provider]  benzonatate (TESSALON) 100 MG capsule Take 1-2 capsules (100-200 mg total) by mouth 3 (three) times daily as needed for cough. Patient not taking: Reported on 08/22/2016 01/08/16   McVey, Gelene Mink, PA-C  Dextromethorphan-Guaifenesin [facility] DM MAXIMUM STRENGTH) 60-1200 MG TB12 Take 1 tablet by mouth every 12 (twelve) hours. Patient not taking: Reported on 08/22/2016 01/08/16   McVey, Gelene Mink, PA-C  HYDROcodone-homatropine [facility]) 5-1.5 MG/5ML syrup Take 5 mLs by mouth every 8 (eight) hours as needed for cough. Patient not taking: Reported on 08/22/2016 01/08/16   McVey, Gelene Mink, PA-C  traMADol (ULTRAM) 50 MG tablet Take 1 tablet (50 mg total) by mouth every 6 (six) hours as needed. 11/15/16   Dorie Rank, MD    Family History Family History  Problem Relation Age of Onset  . Breast cancer Mother   . Cancer Mother   . Heart failure Mother   . Heart disease Father   . Heart attack Father     Social History Social History  Substance Use Topics  . Smoking status: Never Smoker  . Smokeless tobacco: Never Used  . Alcohol use No     Allergies   Codeine   Review of Systems Review of Systems  All other systems reviewed and are negative.    Physical Exam Updated Vital Signs BP 121/65   Pulse 78   Temp 98.3 F  (36.8 C) (Oral)   Resp 18   Ht 1.676 m (5\' 6" )   Wt 74.8 kg (165 lb)   SpO2 96%   BMI 26.63 kg/m   Physical Exam  Constitutional: He appears well-developed and well-nourished. No distress.  HENT:  Head: Normocephalic and atraumatic.  Right Ear: External ear normal.  Left Ear: External ear normal.  Eyes: Conjunctivae are normal. Right eye exhibits no discharge. Left eye exhibits no discharge. No scleral icterus.  Neck: Neck supple. No tracheal deviation present.  Cardiovascular: Normal rate.   Pulmonary/Chest: Effort normal. No stridor. No respiratory distress.  Abdominal: He exhibits no distension.  Musculoskeletal: He exhibits tenderness. He exhibits no edema.  Left knee: He exhibits decreased range of motion, swelling and effusion. He exhibits no laceration and no erythema. Tenderness found.  Neurological: He is alert. Cranial nerve deficit: no gross deficits.  Skin: Skin is warm and dry. No rash noted.  Psychiatric: He has a normal mood and affect.  Nursing note and vitals reviewed.    ED Treatments / Results  Labs (all labs ordered are listed, but only abnormal results are displayed) Labs Reviewed  BODY FLUID CULTURE  URIC ACID, BODY FLUID     Radiology Dg Knee Complete 4 Views Left  Result Date: 11/15/2016 CLINICAL DATA:  [place].  Pain. EXAM: LEFT KNEE - COMPLETE 4+ VIEW COMPARISON:  None. FINDINGS: No evidence of fracture, or dislocation. Large joint effusion. There may be mild superimposed soft tissue swelling. No significant joint space narrowing. IMPRESSION: Large effusion.  Query mild soft tissue swelling.  No fracture. Electronically Signed   By: Staci Righter M.D.   On: 11/15/2016 07:17    Procedures Procedures (including critical care time)  Medications Ordered in ED Medications  lidocaine-EPINEPHrine (XYLOCAINE W/EPI) 2 %-1:200000 (PF) injection 30 mL (not administered)  HYDROcodone-acetaminophen (NORCO/VICODIN) 5-325 MG per tablet 1 tablet (not  administered)  ibuprofen (ADVIL,MOTRIN) tablet 600 mg (600 mg Oral Given 11/15/16 0802)     Initial Impression / Assessment and Plan / ED Course  I have reviewed the triage vital signs and the nursing notes.  Pertinent labs & imaging results that were available during my care of the patient were reviewed by me and considered in my medical decision making (see chart for details).  Clinical Course as of Nov 16 939  Sat Nov 15, 2016  0802 Discussed x-ray findings with patient.  Discussed options of doing an arthrocentesis for symptomatic relief. Patient will have this done  [JK]    Clinical Course User Index [JK] Dorie Rank, MD    Patient presents to the emergency room with complaints of left knee pain and effusion. Symptoms are suggestive of either a ligamentous injury, meniscal injury or loose body within the knee joint. I doubt acute infection or inflammatory arthritis.  Arthrocentesis attempted for symptomatic relief by PA Rona Ravens.  Limited amount of fluid obtained.  Will dc home with crutches, outpatient ortho follow up.  Pain meds prn.  Final Clinical Impressions(s) / ED Diagnoses   Final diagnoses:  Effusion of left knee    New Prescriptions New Prescriptions   TRAMADOL (ULTRAM) 50 MG TABLET    Take 1 tablet (50 mg total) by mouth every 6 (six) hours as needed.     Dorie Rank, MD 11/15/16 [phone]

## 2016-11-15 NOTE — ED Triage Notes (Signed)
Brought in by EMS from home with c/o progressive pain and swelling to left knee.  Pt reported that he twisted his leg "the wrong way" while at work last Thursday.  Pt reports progressive pain and swelling, and now pain "is unbearable with little movement".  Pt's left knee was immobilized by EMS on scene.

## 2016-11-15 NOTE — ED Notes (Signed)
Bed: WA12 Expected date:  Expected time:  Means of arrival:  Comments: EMS 

## 2016-11-16 LAB — URIC ACID, BODY FLUID: Uric Acid Body Fluid: 6.1 mg/dL

## 2016-11-20 LAB — CULTURE, BODY FLUID W GRAM STAIN -BOTTLE: Culture: NO GROWTH

## 2016-12-01 ENCOUNTER — Ambulatory Visit (HOSPITAL_COMMUNITY)
Admission: RE | Admit: 2016-12-01 | Discharge: 2016-12-01 | Disposition: A | Discharge status: Home / Self Care | Payer: Medicare Other | Source: Ambulatory Visit | Attending: Orthopedic Surgery | Admitting: Orthopedic Surgery

## 2016-12-01 ENCOUNTER — Other Ambulatory Visit (HOSPITAL_COMMUNITY): Payer: Self-pay | Admitting: Orthopedic Surgery

## 2016-12-01 DIAGNOSIS — M79605 Pain in left leg: Secondary | ICD-10-CM

## 2016-12-01 DIAGNOSIS — M7989 Other specified soft tissue disorders: Principal | ICD-10-CM

## 2016-12-01 DIAGNOSIS — R590 Localized enlarged lymph nodes: Secondary | ICD-10-CM | POA: Insufficient documentation

## 2016-12-01 NOTE — Progress Notes (Signed)
VASCULAR LAB PRELIMINARY  PRELIMINARY  PRELIMINARY  PRELIMINARY  Left lower extremity venous duplex completed.    Preliminary report:  There is no DVT or SVT noted in the left lower extremity.   Jim Lundin, RVT 12/01/2016, 10:43 AM

## 2016-12-03 ENCOUNTER — Emergency Department (HOSPITAL_COMMUNITY): Payer: Medicare Other

## 2016-12-03 ENCOUNTER — Encounter (HOSPITAL_COMMUNITY): Payer: Self-pay | Admitting: Emergency Medicine

## 2016-12-03 ENCOUNTER — Emergency Department (HOSPITAL_COMMUNITY)
Admission: EM | Admit: 2016-12-03 | Discharge: 2016-12-03 | Disposition: A | Discharge status: Home / Self Care | Payer: Medicare Other | Attending: Emergency Medicine | Admitting: Emergency Medicine

## 2016-12-03 DIAGNOSIS — Z7982 Long term (current) use of aspirin: Secondary | ICD-10-CM | POA: Insufficient documentation

## 2016-12-03 DIAGNOSIS — Y939 Activity, unspecified: Secondary | ICD-10-CM | POA: Diagnosis not present

## 2016-12-03 DIAGNOSIS — Y999 Unspecified external cause status: Secondary | ICD-10-CM | POA: Insufficient documentation

## 2016-12-03 DIAGNOSIS — R9089 Other abnormal findings on diagnostic imaging of central nervous system: Secondary | ICD-10-CM | POA: Diagnosis not present

## 2016-12-03 DIAGNOSIS — Z955 Presence of coronary angioplasty implant and graft: Secondary | ICD-10-CM | POA: Diagnosis not present

## 2016-12-03 DIAGNOSIS — I2510 Atherosclerotic heart disease of native coronary artery without angina pectoris: Secondary | ICD-10-CM | POA: Insufficient documentation

## 2016-12-03 DIAGNOSIS — S0990XA Unspecified injury of head, initial encounter: Secondary | ICD-10-CM | POA: Diagnosis present

## 2016-12-03 DIAGNOSIS — I10 Essential (primary) hypertension: Secondary | ICD-10-CM | POA: Insufficient documentation

## 2016-12-03 DIAGNOSIS — Y929 Unspecified place or not applicable: Secondary | ICD-10-CM | POA: Insufficient documentation

## 2016-12-03 DIAGNOSIS — M25562 Pain in left knee: Secondary | ICD-10-CM | POA: Insufficient documentation

## 2016-12-03 DIAGNOSIS — Z79899 Other long term (current) drug therapy: Secondary | ICD-10-CM | POA: Diagnosis not present

## 2016-12-03 MED ORDER — ACETAMINOPHEN 325 MG PO TABS
650.0000 mg | ORAL_TABLET | Freq: Once | ORAL | Status: AC
  Administered 2016-12-03: 650 mg via ORAL
  Filled 2016-12-03: qty 2

## 2016-12-03 MED ORDER — CYCLOBENZAPRINE HCL 5 MG PO TABS: 5.0000 mg | ORAL_TABLET | Freq: Every evening | ORAL | 0 refills | Status: DC | PRN

## 2016-12-03 NOTE — ED Notes (Signed)
Patient transported to X-ray 
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
## 2017-01-13 ENCOUNTER — Other Ambulatory Visit: Payer: Self-pay | Admitting: Neurosurgery

## 2017-01-13 DIAGNOSIS — D352 Benign neoplasm of pituitary gland: Secondary | ICD-10-CM

## 2017-01-14 ENCOUNTER — Telehealth: Payer: Self-pay | Admitting: Interventional Cardiology

## 2017-01-14 NOTE — Telephone Encounter (Signed)
[phone] work wife #  Pt's wife called needing to know pt's stint information or can he be sent a new card about them.

## 2017-01-14 NOTE — Telephone Encounter (Signed)
Spoke with pt and advised him that he would need to contact the hospital to obtain stent information.  Asked pt about stent card.  He said he did receive a card before leaving the hospital.  Advised pt he should be able to present that card to the office doing the CT he is needing and they can get the information they need from that.  Advised if they need anything else just to contact hospital for this info.  Pt appreciative for call.

## 2017-04-17 DIAGNOSIS — M25562 Pain in left knee: Secondary | ICD-10-CM | POA: Diagnosis not present

## 2017-04-17 DIAGNOSIS — M19041 Primary osteoarthritis, right hand: Secondary | ICD-10-CM | POA: Diagnosis not present

## 2017-04-22 ENCOUNTER — Ambulatory Visit: Payer: Medicare Other | Admitting: Physician Assistant

## 2017-04-22 DIAGNOSIS — R972 Elevated prostate specific antigen [PSA]: Secondary | ICD-10-CM | POA: Diagnosis not present

## 2017-04-24 ENCOUNTER — Other Ambulatory Visit: Payer: Self-pay

## 2017-04-24 ENCOUNTER — Ambulatory Visit (INDEPENDENT_AMBULATORY_CARE_PROVIDER_SITE_OTHER): Payer: Medicare Other | Admitting: Physician Assistant

## 2017-04-24 ENCOUNTER — Encounter: Payer: Self-pay | Admitting: Physician Assistant

## 2017-04-24 VITALS — BP 126/67 | HR 75 | Temp 98.3°F | Resp 16 | Ht 66.0 in | Wt 171.0 lb

## 2017-04-24 DIAGNOSIS — E291 Testicular hypofunction: Secondary | ICD-10-CM | POA: Diagnosis not present

## 2017-04-24 DIAGNOSIS — G479 Sleep disorder, unspecified: Secondary | ICD-10-CM | POA: Diagnosis not present

## 2017-04-24 MED ORDER — ZOLPIDEM TARTRATE 10 MG PO TABS: 10.0000 mg | ORAL_TABLET | Freq: Every evening | ORAL | 1 refills | Status: DC | PRN

## 2017-04-24 NOTE — Patient Instructions (Addendum)
Please come back and see me in 3-6 month for your annual check-up.   Thank you for coming in today. I hope you feel we met your needs.  Feel free to call PCP if you have any questions or further requests.  Please consider signing up for MyChart if you do not already have it, as this is a great way to communicate with me.  Best,  Mercer Pod, PA-C  Health Maintenance, Male A healthy lifestyle and preventive care is important for your health and wellness. Ask your health care provider about what schedule of regular examinations is right for you. What should I know about weight and diet? Eat a Healthy Diet  Eat plenty of vegetables, fruits, whole grains, low-fat dairy products, and lean protein.  Do not eat a lot of foods high in solid fats, added sugars, or salt.  Maintain a Healthy Weight Regular exercise can help you achieve or maintain a healthy weight. You should:  Do at least 150 minutes of exercise each week. The exercise should increase your heart rate and make you sweat (moderate-intensity exercise).  Do strength-training exercises at least twice a week.  Watch Your Levels of Cholesterol and Blood Lipids  Have your blood tested for lipids and cholesterol every 5 years starting at 70 years of age. If you are at high risk for heart disease, you should start having your blood tested when you are 70 years old. You may need to have your cholesterol levels checked more often if: ? Your lipid or cholesterol levels are high. ? You are older than 70 years of age. ? You are at high risk for heart disease.  What should I know about cancer screening? Many types of cancers can be detected early and may often be prevented. Lung Cancer  You should be screened every year for lung cancer if: ? You are a current smoker who has smoked for at least 30 years. ? You are a former smoker who has quit within the past 15 years.  Talk to your health care provider about your screening options,  when you should start screening, and how often you should be screened.  Colorectal Cancer  Routine colorectal cancer screening usually begins at 70 years of age and should be repeated every 5-10 years until you are 70 years old. You may need to be screened more often if early forms of precancerous polyps or small growths are found. Your health care provider may recommend screening at an earlier age if you have risk factors for colon cancer.  Your health care provider may recommend using home test kits to check for hidden blood in the stool.  A small camera at the end of a tube can be used to examine your colon (sigmoidoscopy or colonoscopy). This checks for the earliest forms of colorectal cancer.  Prostate and Testicular Cancer  Depending on your age and overall health, your health care provider may do certain tests to screen for prostate and testicular cancer.  Talk to your health care provider about any symptoms or concerns you have about testicular or prostate cancer.  Skin Cancer  Check your skin from head to toe regularly.  Tell your health care provider about any new moles or changes in moles, especially if: ? There is a change in a mole's size, shape, or color. ? You have a mole that is larger than a pencil eraser.  Always use sunscreen. Apply sunscreen liberally and repeat throughout the day.  Protect yourself by wearing  long sleeves, pants, a wide-brimmed hat, and sunglasses when outside.  What should I know about heart disease, diabetes, and high blood pressure?  If you are 70-63 years of age, have your blood pressure checked every 3-5 years. If you are 61 years of age or older, have your blood pressure checked every year. You should have your blood pressure measured twice-once when you are at a hospital or clinic, and once when you are not at a hospital or clinic. Record the average of the two measurements. To check your blood pressure when you are not at a hospital or  clinic, you can use: ? An automated blood pressure machine at a pharmacy. ? A home blood pressure monitor.  Talk to your health care provider about your target blood pressure.  If you are between 108-78 years old, ask your health care provider if you should take aspirin to prevent heart disease.  Have regular diabetes screenings by checking your fasting blood sugar level. ? If you are at a normal weight and have a low risk for diabetes, have this test once every three years after the age of 63. ? If you are overweight and have a high risk for diabetes, consider being tested at a younger age or more often.  A one-time screening for abdominal aortic aneurysm (AAA) by ultrasound is recommended for men aged 67-75 years who are current or former smokers. What should I know about preventing infection? Hepatitis B If you have a higher risk for hepatitis B, you should be screened for this virus. Talk with your health care provider to find out if you are at risk for hepatitis B infection. Hepatitis C Blood testing is recommended for:  Everyone born from [birth year] through 1963.  Anyone with known risk factors for hepatitis C.  Sexually Transmitted Diseases (STDs)  You should be screened each year for STDs including gonorrhea and chlamydia if: ? You are sexually active and are younger than 70 years of age. ? You are older than 70 years of age and your health care provider tells you that you are at risk for this type of infection. ? Your sexual activity has changed since you were last screened and you are at an increased risk for chlamydia or gonorrhea. Ask your health care provider if you are at risk.  Talk with your health care provider about whether you are at high risk of being infected with HIV. Your health care provider may recommend a prescription medicine to help prevent HIV infection.  What else can I do?  Schedule regular health, dental, and eye exams.  Stay current with your vaccines  (immunizations).  Do not use any tobacco products, such as cigarettes, chewing tobacco, and e-cigarettes. If you need help quitting, ask your health care provider.  Limit alcohol intake to no more than 2 drinks per day. One drink equals 12 ounces of beer, 5 ounces of wine, or 1 ounces of hard liquor.  Do not use street drugs.  Do not share needles.  Ask your health care provider for help if you need support or information about quitting drugs.  Tell your health care provider if you often feel depressed.  Tell your health care provider if you have ever been abused or do not feel safe at home. This information is not intended to replace advice given to you by your health care provider. Make sure you discuss any questions you have with your health care provider. Document Released: 07/12/2007 Document Revised: 09/12/2015 Document Reviewed:  10/17/2014 Elsevier Interactive Patient Education  2018 [employer].   IF you received an x-ray today, you will receive an invoice from [facility]. Please contact [facility] at [phone] with questions or concerns regarding your invoice.   IF you received labwork today, you will receive an invoice from [facility]. Please contact [facility] at [phone] with questions or concerns regarding your invoice.   Our billing staff will not be able to assist you with questions regarding bills from these companies.  You will be contacted with the lab results as soon as they are available. The fastest way to get your results is to activate your My Chart account. Instructions are located on the last page of this paperwork. If you have not heard from [place] regarding the results in 2 weeks, please contact this office.

## 2017-04-24 NOTE — Progress Notes (Signed)
[facility]  MRN: [id number] DOB: Apr 28, 1945  PCP: Ricke Hey, MD  Subjective:  Pt is a 72 year old male who presents to clinic for several complaints,  1) Not sleeping well. Zolpidem helps.  "I haven't been able to sleep since I was 32".  Goes to bed at 10:30. Goes to the bathroom 2am and cannot fall back asleep until 5 am. His mind cannot turn off. He takes 1/2 pill at 2 a.m. every time he wakes up. This works well for him. Denies medication side effects.   He has tried melatonin, Tylenol PM - doesn't help. "I have taken every sleeping pill that's on the market".   2) He has no energy through the day "Bc my testosterone is so low." He is starting to drink Red Bull. He would like a referral to urologist. Dr. Prescott Parma retired and "they gave me another doctor." he was receiving injections for testosterone from Dr. Prescott Parma. His new provider stopped the injections and advised him to have a biopsy of his prostate. He would like a second opinion.    Review of Systems  Constitutional: Positive for fatigue. Negative for diaphoresis.  Psychiatric/Behavioral: Positive for sleep disturbance.    Patient Active Problem List   Diagnosis Date Noted  . Aortic stenosis 03/10/2014  . Left carotid bruit 03/10/2014  . Glaucoma   . Hypertension   . Enlarged prostate   . Coronary artery disease   . Peripheral vascular disease ([place])   . Dizziness   . Hyperlipidemia   . GERD (gastroesophageal reflux disease)   . Gout   . Exertional shortness of breath   . Iron deficiency anemia   . Migraine   . Arthritis   . S/P laparoscopic cholecystectomy 01/25/2013  . Thrombosed external hemorrhoid 03/25/2012    Current Outpatient Medications on File Prior to Visit  Medication Sig Dispense Refill  . acetaminophen (TYLENOL) 500 MG tablet Take 1,500 mg by mouth every 6 (six) hours as needed for moderate pain.    . Ascorbic Acid (VITAMIN C PO) Take 1 tablet by mouth daily.    Marland Kitchen aspirin 81 MG tablet  Take 81 mg by mouth daily.    . metoprolol succinate (TOPROL-XL) 50 MG 24 hr tablet Take 50 mg daily by mouth. Patient taking 25 mg daily.  1  . Multiple Vitamin (MULTIVITAMIN WITH MINERALS) TABS Take 1 tablet by mouth daily. Reported on 03/06/2015    . rosuvastatin (CRESTOR) 5 MG tablet TAKE 1 TABLET BY MOUTH 5 TIMES A WEEK (Patient taking differently: Take 5 mg by mouth. TAKE 1 TABLET BY MOUTH 4 TIMES A WEEK) 25 tablet 11  . tamsulosin (FLOMAX) 0.4 MG CAPS capsule Take 0.8 mg by mouth at bedtime.   0  . travoprost, benzalkonium, (TRAVATAN) 0.004 % ophthalmic solution Place 1 drop into both eyes at bedtime.     . colchicine 0.6 MG tablet Take 1.2 mg daily by mouth.  0  . cyclobenzaprine (FLEXERIL) 5 MG tablet Take 1 tablet (5 mg total) at bedtime as needed by mouth for muscle spasms. (Patient not taking: Reported on 04/24/2017) 12 tablet 0  . testosterone cypionate (DEPOTESTOSTERONE CYPIONATE) 200 MG/ML injection Inject 1 mL into the muscle every 14 (fourteen) days.  0  . traMADol (ULTRAM) 50 MG tablet Take 1 tablet (50 mg total) by mouth every 6 (six) hours as needed. (Patient not taking: Reported on 12/03/2016) 15 tablet 0   No current facility-administered medications on file prior to visit.  Allergies  Allergen Reactions  . Codeine Itching     Objective:  BP 126/67   Pulse 75   Temp 98.3 F (36.8 C) (Oral)   Resp 16   Ht 5\' 6"  (1.676 m)   Wt 171 lb (77.6 kg)   SpO2 98%   BMI 27.60 kg/m   Physical Exam  Constitutional: He is oriented to person, place, and time and well-developed, well-nourished, and in no distress. No distress.  Cardiovascular: Normal rate, regular rhythm and normal heart sounds.  Neurological: He is alert and oriented to person, place, and time. GCS score is 15.  Skin: Skin is warm and dry.  Psychiatric: Mood, memory, affect and judgment normal.  Vitals reviewed.   Assessment and Plan :  1. Testosterone deficiency in male - Ambulatory referral to  Urology  2. Difficulty sleeping - zolpidem (AMBIEN) 10 MG tablet; Take 1 tablet (10 mg total) by mouth at bedtime as needed for sleep.  Dispense: 30 tablet; Refill: 1   [name], PA-C  [facility] [place] 04/24/2017 5:32 PM

## 2017-05-06 ENCOUNTER — Other Ambulatory Visit: Payer: Self-pay | Admitting: *Deleted

## 2017-05-06 MED ORDER — METOPROLOL SUCCINATE ER 50 MG PO TB24: ORAL_TABLET | ORAL | 0 refills | Status: DC

## 2017-05-06 NOTE — Telephone Encounter (Signed)
Yes, ok to fill Metoprolol Succinate 25mg  QD.  Thanks!

## 2017-05-06 NOTE — Telephone Encounter (Signed)
Patients wife returned call in regards to clarification of metoprolol dose. She read from the bottle that patient has the 50 mg tablet and he is taking one-half tablet qd. Okay to update med list and refill as reported? Please advise. Thanks, MI

## 2017-05-06 NOTE — Telephone Encounter (Signed)
Please advise on what current dose should be as medication has two doses listed. Thanks, MI

## 2017-05-06 NOTE — Telephone Encounter (Signed)
Spoke with pt to clarify if he is taking Metoprolol Succ 25 or 50 QD?  Pt states he takes 1/2 a pill everyday.  Asked if it was half of a 50mg  tab or 25mg  tab.  Pt states he is at work and will call back tomorrow morning with this information.

## 2017-05-20 DIAGNOSIS — H401131 Primary open-angle glaucoma, bilateral, mild stage: Secondary | ICD-10-CM | POA: Diagnosis not present

## 2017-05-25 ENCOUNTER — Other Ambulatory Visit: Payer: Self-pay

## 2017-05-25 DIAGNOSIS — E785 Hyperlipidemia, unspecified: Secondary | ICD-10-CM

## 2017-05-25 DIAGNOSIS — M7742 Metatarsalgia, left foot: Secondary | ICD-10-CM | POA: Diagnosis not present

## 2017-05-25 DIAGNOSIS — M25562 Pain in left knee: Secondary | ICD-10-CM | POA: Diagnosis not present

## 2017-05-26 MED ORDER — ROSUVASTATIN CALCIUM 5 MG PO TABS: ORAL_TABLET | ORAL | 0 refills | Status: DC

## 2017-05-29 DIAGNOSIS — M25562 Pain in left knee: Secondary | ICD-10-CM | POA: Diagnosis not present

## 2017-06-02 ENCOUNTER — Other Ambulatory Visit: Payer: Self-pay | Admitting: Interventional Cardiology

## 2017-06-02 MED ORDER — METOPROLOL SUCCINATE ER 50 MG PO TB24: ORAL_TABLET | ORAL | 0 refills | Status: DC

## 2017-06-02 NOTE — Telephone Encounter (Signed)
Pt's medication was sent to pt's pharmacy as requested. Confirmation received.  °

## 2017-06-15 DIAGNOSIS — N401 Enlarged prostate with lower urinary tract symptoms: Secondary | ICD-10-CM | POA: Diagnosis not present

## 2017-06-15 DIAGNOSIS — E291 Testicular hypofunction: Secondary | ICD-10-CM | POA: Diagnosis not present

## 2017-06-15 DIAGNOSIS — R972 Elevated prostate specific antigen [PSA]: Secondary | ICD-10-CM | POA: Diagnosis not present

## 2017-06-17 ENCOUNTER — Encounter: Payer: Self-pay | Admitting: Physician Assistant

## 2017-06-17 DIAGNOSIS — E291 Testicular hypofunction: Secondary | ICD-10-CM | POA: Diagnosis not present

## 2017-06-19 DIAGNOSIS — M25562 Pain in left knee: Secondary | ICD-10-CM | POA: Diagnosis not present

## 2017-06-27 ENCOUNTER — Ambulatory Visit
Admission: RE | Admit: 2017-06-27 | Discharge: 2017-06-27 | Disposition: A | Discharge status: Home / Self Care | Payer: Medicare Other | Source: Ambulatory Visit | Attending: Neurosurgery | Admitting: Neurosurgery

## 2017-06-27 DIAGNOSIS — D352 Benign neoplasm of pituitary gland: Secondary | ICD-10-CM | POA: Diagnosis not present

## 2017-06-27 MED ORDER — GADOBENATE DIMEGLUMINE 529 MG/ML IV SOLN
7.0000 mL | Freq: Once | INTRAVENOUS | Status: AC | PRN
  Administered 2017-06-27: 7 mL via INTRAVENOUS

## 2017-07-03 ENCOUNTER — Telehealth: Payer: Self-pay

## 2017-07-03 ENCOUNTER — Other Ambulatory Visit: Payer: Self-pay | Admitting: Interventional Cardiology

## 2017-07-03 MED ORDER — METOPROLOL SUCCINATE ER 25 MG PO TB24: 25.0000 mg | ORAL_TABLET | Freq: Every day | ORAL | 1 refills | Status: DC

## 2017-07-03 NOTE — Telephone Encounter (Signed)
Loren Racer, LPN       3/0/14 84:03 AM  Note    Spoke with pt and clarified that he is taking a 1/2 tablet of Meto Succ 50mg .  Please send in new prescription for Metoprolol Succinate 25mg  QD so he doesn't have to keep breaking tablets in half.  Pt is aware we are changing him to the 25mg  tablets.  Thanks!

## 2017-07-03 NOTE — Telephone Encounter (Signed)
Called pharmacy

## 2017-07-03 NOTE — Telephone Encounter (Signed)
Spoke with pt and clarified that he is taking a 1/2 tablet of Meto Succ 50mg .  Please send in new prescription for Metoprolol Succinate 25mg  QD so he doesn't have to keep breaking tablets in half.  Pt is aware we are changing him to the 25mg  tablets.  Thanks!

## 2017-07-07 DIAGNOSIS — D352 Benign neoplasm of pituitary gland: Secondary | ICD-10-CM | POA: Diagnosis not present

## 2017-07-07 DIAGNOSIS — I10 Essential (primary) hypertension: Secondary | ICD-10-CM | POA: Diagnosis not present

## 2017-07-09 ENCOUNTER — Other Ambulatory Visit: Payer: Self-pay

## 2017-07-09 NOTE — Patient Outreach (Signed)
[place] [facility]) Care Management  07/09/2017  [place] [birth year] [id number]   Medication Adherence call to Mr. Robert Estes patient did not answer he   is due on Rosuvastatin 5 mg [facility] said last time patient pick up was in April . Robert Estes is showing past due under [facility]  [place] Management Direct Dial [phone]  Fax [phone] [email or web address]

## 2017-07-28 ENCOUNTER — Other Ambulatory Visit: Payer: Self-pay | Admitting: Physician Assistant

## 2017-07-28 DIAGNOSIS — G479 Sleep disorder, unspecified: Secondary | ICD-10-CM

## 2017-07-28 NOTE — Telephone Encounter (Signed)
Interface request for for Ambien 10 mg   LOV 04/24/17  With Dr. Magda Kiel   Last refill 05/25/17   Pharmacy  [facility] [phone]

## 2017-07-29 NOTE — Telephone Encounter (Signed)
° ° ° °  Pt following up on req for refill said going out of town and need med today    zolpidem (AMBIEN) 10 MG tablet(Expired)

## 2017-08-06 ENCOUNTER — Other Ambulatory Visit: Payer: Self-pay | Admitting: Cardiology

## 2017-08-06 DIAGNOSIS — E785 Hyperlipidemia, unspecified: Secondary | ICD-10-CM

## 2017-08-13 DIAGNOSIS — M25662 Stiffness of left knee, not elsewhere classified: Secondary | ICD-10-CM | POA: Diagnosis not present

## 2017-08-13 DIAGNOSIS — M25562 Pain in left knee: Secondary | ICD-10-CM | POA: Diagnosis not present

## 2017-08-17 DIAGNOSIS — D352 Benign neoplasm of pituitary gland: Secondary | ICD-10-CM | POA: Insufficient documentation

## 2017-08-17 DIAGNOSIS — J343 Hypertrophy of nasal turbinates: Secondary | ICD-10-CM | POA: Diagnosis not present

## 2017-08-17 DIAGNOSIS — J342 Deviated nasal septum: Secondary | ICD-10-CM | POA: Insufficient documentation

## 2017-08-17 DIAGNOSIS — J3489 Other specified disorders of nose and nasal sinuses: Secondary | ICD-10-CM | POA: Diagnosis not present

## 2017-08-20 ENCOUNTER — Other Ambulatory Visit: Payer: Self-pay | Admitting: Neurosurgery

## 2017-08-29 ENCOUNTER — Other Ambulatory Visit: Payer: Self-pay | Admitting: Physician Assistant

## 2017-08-29 DIAGNOSIS — G479 Sleep disorder, unspecified: Secondary | ICD-10-CM

## 2017-08-31 NOTE — Telephone Encounter (Signed)
ambien 10 MG tab.  Refill request Last Refill:08/01/17 #30 no refills Last OV: 04/24/17 PCP: Mercer Pod, [place] Pharmacy: [facility] [phone]  Pended for PCP consideration

## 2017-09-03 ENCOUNTER — Emergency Department (HOSPITAL_COMMUNITY)
Admission: EM | Admit: 2017-09-03 | Discharge: 2017-09-03 | Disposition: A | Discharge status: Home / Self Care | Payer: Medicare Other | Attending: Emergency Medicine | Admitting: Emergency Medicine

## 2017-09-03 ENCOUNTER — Other Ambulatory Visit: Payer: Self-pay

## 2017-09-03 ENCOUNTER — Encounter (HOSPITAL_COMMUNITY): Payer: Self-pay

## 2017-09-03 ENCOUNTER — Other Ambulatory Visit: Payer: Self-pay | Admitting: Interventional Cardiology

## 2017-09-03 ENCOUNTER — Emergency Department (HOSPITAL_COMMUNITY): Payer: Medicare Other

## 2017-09-03 DIAGNOSIS — Z79899 Other long term (current) drug therapy: Secondary | ICD-10-CM | POA: Insufficient documentation

## 2017-09-03 DIAGNOSIS — N3000 Acute cystitis without hematuria: Secondary | ICD-10-CM | POA: Diagnosis not present

## 2017-09-03 DIAGNOSIS — I10 Essential (primary) hypertension: Secondary | ICD-10-CM | POA: Diagnosis not present

## 2017-09-03 DIAGNOSIS — I2510 Atherosclerotic heart disease of native coronary artery without angina pectoris: Secondary | ICD-10-CM | POA: Insufficient documentation

## 2017-09-03 DIAGNOSIS — Z7982 Long term (current) use of aspirin: Secondary | ICD-10-CM | POA: Diagnosis not present

## 2017-09-03 DIAGNOSIS — I739 Peripheral vascular disease, unspecified: Secondary | ICD-10-CM | POA: Diagnosis not present

## 2017-09-03 DIAGNOSIS — D352 Benign neoplasm of pituitary gland: Secondary | ICD-10-CM | POA: Diagnosis not present

## 2017-09-03 DIAGNOSIS — R270 Ataxia, unspecified: Secondary | ICD-10-CM | POA: Diagnosis not present

## 2017-09-03 DIAGNOSIS — R42 Dizziness and giddiness: Secondary | ICD-10-CM | POA: Diagnosis not present

## 2017-09-03 LAB — URINALYSIS, ROUTINE W REFLEX MICROSCOPIC
Bilirubin Urine: NEGATIVE
Glucose, UA: NEGATIVE mg/dL
Hgb urine dipstick: NEGATIVE
Ketones, ur: NEGATIVE mg/dL
Nitrite: NEGATIVE
Protein, ur: NEGATIVE mg/dL
Specific Gravity, Urine: 1.018 (ref 1.005–1.030)
pH: 5 (ref 5.0–8.0)

## 2017-09-03 LAB — BASIC METABOLIC PANEL
Anion gap: 5 (ref 5–15)
BUN: 17 mg/dL (ref 8–23)
CO2: 25 mmol/L (ref 22–32)
Calcium: 8.8 mg/dL — ABNORMAL LOW (ref 8.9–10.3)
Chloride: 112 mmol/L — ABNORMAL HIGH (ref 98–111)
Creatinine, Ser: 1.48 mg/dL — ABNORMAL HIGH (ref 0.61–1.24)
GFR calc Af Amer: 54 mL/min — ABNORMAL LOW (ref >60)
GFR calc non Af Amer: 47 mL/min — ABNORMAL LOW (ref >60)
Glucose, Bld: 89 mg/dL (ref 70–99)
Potassium: 4.1 mmol/L (ref 3.5–5.1)
Sodium: 142 mmol/L (ref 135–145)

## 2017-09-03 LAB — CBC
HCT: 37.1 % — ABNORMAL LOW (ref 39.0–52.0)
Hemoglobin: 12.7 g/dL — ABNORMAL LOW (ref 13.0–17.0)
MCH: 29.7 pg (ref 26.0–34.0)
MCHC: 34.2 g/dL (ref 30.0–36.0)
MCV: 86.7 fL (ref 78.0–100.0)
Platelets: 158 10*3/uL (ref 150–400)
RBC: 4.28 MIL/uL (ref 4.22–5.81)
RDW: 13.9 % (ref 11.5–15.5)
WBC: 3 10*3/uL — ABNORMAL LOW (ref 4.0–10.5)

## 2017-09-03 LAB — CBG MONITORING, ED: Glucose-Capillary: 137 mg/dL — ABNORMAL HIGH (ref 70–99)

## 2017-09-03 MED ORDER — GADOBENATE DIMEGLUMINE 529 MG/ML IV SOLN
10.0000 mL | Freq: Once | INTRAVENOUS | Status: AC | PRN
  Administered 2017-09-03: 10 mL via INTRAVENOUS

## 2017-09-03 MED ORDER — CEPHALEXIN 500 MG PO CAPS: 500.0000 mg | ORAL_CAPSULE | Freq: Two times a day (BID) | ORAL | 0 refills | Status: AC

## 2017-09-03 MED ORDER — MECLIZINE HCL 25 MG PO TABS: 25.0000 mg | ORAL_TABLET | Freq: Three times a day (TID) | ORAL | 0 refills | Status: DC | PRN

## 2017-09-03 NOTE — ED Notes (Signed)
Failed attempt at blood draw, Lt arm.

## 2017-09-03 NOTE — ED Provider Notes (Signed)
[facility] DEPT Provider Note   CSN: [id number] Arrival date & time: 09/03/17  1321     History   Chief Complaint Chief Complaint  Patient presents with  . Dizziness    HPI Robert Estes is a 72 y.o. male.  HPI Patient presents with unsteadiness walking.  States it began when Robert Estes woke up at around 130 this morning to urinate.  Has been having difficulty walking since.  Robert Estes was okay yesterday.  No headache.  No vision changes.  Does have known pituitary adenoma that is supposed to be operated on in the month by Dr. Christella Noa and Dr. Wilburn Cornelia.  No confusion.  Patient states Robert Estes is worried the tumor has expanded. Past Medical History:  Diagnosis Date  . Arthritis    "hands, knees, elbows, fingers" (01/25/2013)  . Coronary artery disease    HX OF HEART STENTS X 2   2010--DR. H. SMITH IS PT'S CARDIOLOGIST, MILD AORTIC STENOSIS  . Dizziness   . Enlarged prostate    FLOMAX HELPS PT VOID  . Exertional shortness of breath   . GERD (gastroesophageal reflux disease)    OTC PREVACID AS NEEDED  . Glaucoma    BOTH EYES  . Gout   . Heart murmur   . Hyperlipidemia   . Hypertension   . Hypertension   . Iron deficiency anemia    hx  . Migraine    "monthly" (01/25/2013)  . Peripheral vascular disease ([place])    CLAUDICATION    Patient Active Problem List   Diagnosis Date Noted  . Aortic stenosis 03/10/2014  . Left carotid bruit 03/10/2014  . Glaucoma   . Hypertension   . Enlarged prostate   . Coronary artery disease   . Peripheral vascular disease ([place])   . Dizziness   . Hyperlipidemia   . GERD (gastroesophageal reflux disease)   . Gout   . Exertional shortness of breath   . Iron deficiency anemia   . Migraine   . Arthritis   . S/P laparoscopic cholecystectomy 01/25/2013  . Thrombosed external hemorrhoid 03/25/2012    Past Surgical History:  Procedure Laterality Date  . CHOLECYSTECTOMY N/A 01/25/2013   Procedure: LAPAROSCOPIC  CHOLECYSTECTOMY WITH INTRAOPERATIVE CHOLANGIOGRAM;  Surgeon: [name], MD;  Location: [place];  Service: General;  Laterality: N/A;  . CORONARY ANGIOPLASTY WITH STENT PLACEMENT  11/27/2008   "2" (01/25/2013)  . EUS  12/24/2011   Procedure: FULL UPPER ENDOSCOPIC ULTRASOUND (EUS) RADIAL;  Surgeon: [name], MD;  Location: [facility];  Service: Endoscopy;  Laterality: N/A;  . INCISION AND DRAINAGE     thrombosed hemorrhoid  . LAPAROSCOPIC CHOLECYSTECTOMY  01/25/2013        Home Medications    Prior to Admission medications   Medication Sig Start Date End Date Taking? Authorizing Provider  acetaminophen (TYLENOL) 500 MG tablet Take 1,500 mg by mouth every 6 (six) hours as needed for moderate pain.   Yes [provider]  Ascorbic Acid (VITAMIN C PO) Take 2 tablets by mouth daily.    Yes [provider]  aspirin 81 MG tablet Take 81 mg by mouth daily.   Yes [provider]  clomiPHENE (CLOMID) 50 MG tablet Take 50 mg by mouth every other day. 08/01/17  Yes [provider]  metoprolol succinate (TOPROL XL) 25 MG 24 hr tablet Take 1 tablet (25 mg total) by mouth daily. 07/03/17  Yes Belva Crome, MD  Multiple Vitamin (MULTIVITAMIN WITH MINERALS) TABS Take 1  tablet by mouth daily after breakfast. Reported on 03/06/2015   Yes [provider]  rosuvastatin (CRESTOR) 5 MG tablet TAKE 1 TABLET BY MOUTH 5 TIMES A WEEK, NEED APPT FOR REFILLS 08/06/17  Yes Isaiah Serge, NP  tamsulosin (FLOMAX) 0.4 MG CAPS capsule Take 0.8 mg by mouth at bedtime.  02/24/14  Yes [provider]  travoprost, benzalkonium, (TRAVATAN) 0.004 % ophthalmic solution Place 1 drop into both eyes at bedtime.    Yes [provider]  zolpidem (AMBIEN) 10 MG tablet TAKE 1 TABLET(10 MG) BY MOUTH AT BEDTIME AS NEEDED FOR SLEEP Patient taking differently: Take 10 mg by mouth at bedtime as needed for sleep.  08/31/17  Yes McVey, Gelene Mink, PA-C  cyclobenzaprine  (FLEXERIL) 5 MG tablet Take 1 tablet (5 mg total) at bedtime as needed by mouth for muscle spasms. Patient not taking: Reported on 04/24/2017 12/03/16   Maczis, Barth Kirks, PA-C  traMADol (ULTRAM) 50 MG tablet Take 1 tablet (50 mg total) by mouth every 6 (six) hours as needed. Patient not taking: Reported on 12/03/2016 11/15/16   Dorie Rank, MD    Family History Family History  Problem Relation Age of Onset  . Breast cancer Mother   . Cancer Mother   . Heart failure Mother   . Heart disease Father   . Heart attack Father     Social History Social History   Tobacco Use  . Smoking status: Never Smoker  . Smokeless tobacco: Never Used  Substance Use Topics  . Alcohol use: No  . Drug use: No     Allergies   Codeine   Review of Systems Review of Systems  Constitutional: Negative for appetite change.  HENT: Negative for congestion.   Eyes: Negative for visual disturbance.  Cardiovascular: Negative for chest pain.  Gastrointestinal: Negative for abdominal distention.  Genitourinary: Negative for flank pain.  Musculoskeletal: Negative for back pain.  Skin: Negative for rash.  Neurological: Negative for speech difficulty and weakness.  Psychiatric/Behavioral: Negative for confusion.     Physical Exam Updated Vital Signs BP 112/62   Pulse 75   Temp 97.8 F (36.6 C) (Oral)   Resp 13   Ht 5\' 6"  (1.676 m)   Wt 76.2 kg   SpO2 99%   BMI 27.12 kg/m   Physical Exam  Constitutional: Robert Estes is oriented to person, place, and time. Robert Estes appears well-developed.  HENT:  Head: Normocephalic.  Eyes: EOM are normal.  Visual fields grossly intact by confrontation.  Eye movements intact.  Neck: Neck supple.  Cardiovascular: Normal rate.  Pulmonary/Chest: Effort normal.  Abdominal: Soft.  Neurological: Robert Estes is alert and oriented to person, place, and time. No cranial nerve deficit. Coordination normal.  Finger-nose and heel shin intact bilaterally.  Somewhat unsteady with ambulation.   Eye movements intact.  Visual fields intact by confrontation.  Skin: Skin is warm. Capillary refill takes less than 2 seconds.     ED Treatments / Results  Labs (all labs ordered are listed, but only abnormal results are displayed) Labs Reviewed  URINALYSIS, ROUTINE W REFLEX MICROSCOPIC - Abnormal; Notable for the following components:      Result Value   Leukocytes, UA TRACE (*)    Bacteria, UA RARE (*)    All other components within normal limits  CBG MONITORING, ED - Abnormal; Notable for the following components:   Glucose-Capillary 137 (*)    All other components within normal limits  BASIC METABOLIC PANEL  CBC  EKG EKG Interpretation  Date/Time:  Thursday September 03 2017 13:45:38 EDT Ventricular Rate:  77 PR Interval:    QRS Duration: 82 QT Interval:  378 QTC Calculation: 428 R Axis:   51 Text Interpretation:  Sinus rhythm Nonspecific T abnormalities, anterior leads Confirmed by Davonna Belling [phone]) on 09/03/2017 2:56:03 PM   Radiology No results found.  Procedures Procedures (including critical care time)  Medications Ordered in ED Medications - No data to display   Initial Impression / Assessment and Plan / ED Course  I have reviewed the triage vital signs and the nursing notes.  Pertinent labs & imaging results that were available during my care of the patient were reviewed by me and considered in my medical decision making (see chart for details).     Patient with some difficulty walking.  Began this morning.  Rather benign exam except for the unsteadiness.  Has known pituitary adenoma.  Scheduled for surgery.  Will reimage with MRI.  Care will be turned over to Dr. Tomi Bamberger.  Final Clinical Impressions(s) / ED Diagnoses   Final diagnoses:  Ataxia    ED Discharge Orders    None       Davonna Belling, MD 09/03/17 [phone]

## 2017-09-03 NOTE — ED Notes (Signed)
Pt is aware a urine sample is needed. 

## 2017-09-03 NOTE — ED Provider Notes (Signed)
Patient seen by Dr. Alvino Chapel.  Please see his note.  MRI does not show any acute abnormality.  Patient may be having some vertigo issues.  Urinalysis also suggest UTI and he has noted some discomfort with urination.  Will discharge home with prescription for meclizine and Keflex.  Follow-up with his neurosurgeon.   Dorie Rank, MD 09/03/17 [phone]

## 2017-09-03 NOTE — ED Triage Notes (Signed)
Pt states that when he woke up at 0130 to urinate, he was very weak, and bumping into furniture. Pt then had the same happen when he woke up at 0700. Pt states he is concerned his tumor was pushing into a nerve.

## 2017-09-03 NOTE — Discharge Instructions (Addendum)
Follow up with your neurosurgeon, take the antibiotics as prescribed, the meclizine can help with dizziness

## 2017-09-04 ENCOUNTER — Telehealth: Payer: Self-pay | Admitting: Physician Assistant

## 2017-09-04 NOTE — Telephone Encounter (Signed)
Pt's wife calling to f/up on rx request

## 2017-09-04 NOTE — Telephone Encounter (Signed)
Copied from [place] [phone]. Topic: Quick Communication - See Telephone Encounter >> Sep 04, 2017  1:08 PM Bea Graff, NT wrote: CRM for notification. See Telephone encounter for: 09/04/17. Pts wife states the pharmacy did not receive the zolpidem (AMBIEN) 10 MG tablet at [facility] and she will like to see if this can be resent.

## 2017-09-07 ENCOUNTER — Encounter: Payer: Self-pay | Admitting: Family Medicine

## 2017-09-07 ENCOUNTER — Ambulatory Visit (INDEPENDENT_AMBULATORY_CARE_PROVIDER_SITE_OTHER): Payer: Medicare Other | Admitting: Family Medicine

## 2017-09-07 ENCOUNTER — Other Ambulatory Visit: Payer: Self-pay

## 2017-09-07 VITALS — BP 111/65 | HR 85 | Temp 97.9°F | Ht 66.0 in | Wt 164.0 lb

## 2017-09-07 DIAGNOSIS — R42 Dizziness and giddiness: Secondary | ICD-10-CM

## 2017-09-07 DIAGNOSIS — G4700 Insomnia, unspecified: Secondary | ICD-10-CM | POA: Diagnosis not present

## 2017-09-07 DIAGNOSIS — R5383 Other fatigue: Secondary | ICD-10-CM | POA: Diagnosis not present

## 2017-09-07 DIAGNOSIS — D649 Anemia, unspecified: Secondary | ICD-10-CM | POA: Diagnosis not present

## 2017-09-07 DIAGNOSIS — R7989 Other specified abnormal findings of blood chemistry: Secondary | ICD-10-CM | POA: Diagnosis not present

## 2017-09-07 LAB — POCT CBC
Granulocyte percent: 45.9 %G (ref 37–80)
HCT, POC: 40.3 % — AB (ref 43.5–53.7)
Hemoglobin: 12.9 g/dL — AB (ref 14.1–18.1)
Lymph, poc: 1.9 (ref 0.6–3.4)
MCH, POC: 26.8 pg — AB (ref 27–31.2)
MCHC: 32 g/dL (ref 31.8–35.4)
MCV: 83.9 fL (ref 80–97)
MID (cbc): 0.5 (ref 0–0.9)
MPV: 7.3 fL (ref 0–99.8)
POC Granulocyte: 2 (ref 2–6.9)
POC LYMPH PERCENT: 43.6 %L (ref 10–50)
POC MID %: 10.5 %M (ref 0–12)
Platelet Count, POC: 230 10*3/uL (ref 142–424)
RBC: 4.81 M/uL (ref 4.69–6.13)
RDW, POC: 13.7 %
WBC: 4.3 10*3/uL — AB (ref 4.6–10.2)

## 2017-09-07 LAB — GLUCOSE, POCT (MANUAL RESULT ENTRY): POC Glucose: 88 mg/dl (ref 70–99)

## 2017-09-07 MED ORDER — SUVOREXANT 10 MG PO TABS: 10.0000 mg | ORAL_TABLET | Freq: Every evening | ORAL | 2 refills | Status: DC | PRN

## 2017-09-07 NOTE — Patient Instructions (Addendum)
Fatigue may be related to multiple causes.  If vertigo is improving, can decrease meclizine to twice per day for the next few days, then continue to taper back as symptoms are controlled.  I will check blood count today, but less likely cause.  Decreased sleep may also be contributing to fatigue.  I would recommend starting Belsomra as that may help nighttime awakening better than the Ambien, and may have less potential side effects.  Please follow-up with Robert Estes in 1 week to recheck fatigue and sleep, sooner if worse.  Return to the clinic or go to the nearest emergency room if any of your symptoms worsen or new symptoms occur.   Fatigue Fatigue is feeling tired all of the time, a lack of energy, or a lack of motivation. Occasional or mild fatigue is often a normal response to activity or life in general. However, long-lasting (chronic) or extreme fatigue may indicate an underlying medical condition. Follow these instructions at home: Watch your fatigue for any changes. The following actions may help to lessen any discomfort you are feeling:  Talk to your health care provider about how much sleep you need each night. Try to get the required amount every night.  Take medicines only as directed by your health care provider.  Eat a healthy and nutritious diet. Ask your health care provider if you need help changing your diet.  Drink enough fluid to keep your urine clear or pale yellow.  Practice ways of relaxing, such as yoga, meditation, massage therapy, or acupuncture.  Exercise regularly.  Change situations that cause you stress. Try to keep your work and personal routine reasonable.  Do not abuse illegal drugs.  Limit alcohol intake to no more than 1 drink per day for nonpregnant women and 2 drinks per day for men. One drink equals 12 ounces of beer, 5 ounces of wine, or 1 ounces of hard liquor.  Take a multivitamin, if directed by your health care provider.  Contact a health care  provider if:  Your fatigue does not get better.  You have a fever.  You have unintentional weight loss or gain.  You have headaches.  You have difficulty: ? Falling asleep. ? Sleeping throughout the night.  You feel angry, guilty, anxious, or sad.  You are unable to have a bowel movement (constipation).  You skin is dry.  Your legs or another part of your body is swollen. Get help right away if:  You feel confused.  Your vision is blurry.  You feel faint or pass out.  You have a severe headache.  You have severe abdominal, pelvic, or back pain.  You have chest pain, shortness of breath, or an irregular or fast heartbeat.  You are unable to urinate or you urinate less than normal.  You develop abnormal bleeding, such as bleeding from the rectum, vagina, nose, lungs, or nipples.  You vomit blood.  You have thoughts about harming yourself or committing suicide.  You are worried that you might harm someone else. This information is not intended to replace advice given to you by your health care provider. Make sure you discuss any questions you have with your health care provider. Document Released: 11/10/2006 Document Revised: 06/21/2015 Document Reviewed: 05/17/2013 Elsevier Interactive Patient Education  2018 [employer].    IF you received an x-ray today, you will receive an invoice from [facility]. Please contact [facility] at [phone] with questions or concerns regarding your invoice.   IF you received labwork today,  you will receive an invoice from [facility]. Please contact [facility] at [phone] with questions or concerns regarding your invoice.   Our billing staff will not be able to assist you with questions regarding bills from these companies.  You will be contacted with the lab results as soon as they are available. The fastest way to get your results is to activate your My Chart account. Instructions are located on the last  page of this paperwork. If you have not heard from [place] regarding the results in 2 weeks, please contact this office.

## 2017-09-07 NOTE — Progress Notes (Signed)
Subjective:  By signing my name below, I, Robert Estes, attest that this documentation has been prepared under the direction and in the presence of Robert Agreste, MD Electronically Signed: [name], ED Scribe 09/07/2017 at 3:13 PM.   Patient ID: Robert Estes, male    DOB: 05/26/1945, 72 y.o.   MRN: [id number]  Chief Complaint  Patient presents with  . Dizziness  . no engergy    since thursday  . sleeping medication refill    ambien (08/31/2017 was sent as print)   HPI Robert Estes is a 72 y.o. male who presents to Primary Care at [facility]. New pt to me. Here with dizziness and fatigue x 4-5 days. H/o multiple medical probs including CAD, PAD, GERD, anemia, aortic stenosis with bicuspid aortic valve, pituitary adenoma with planned surgery 9/6. Notes reviewed from 7/26 by ENT, MRI showed pituitary mass with superior extension 2 x 2 cm. He was having visual changes and frequent intermittent HAs. Also seen in ER 4 days ago with unsteadiness with walking, noted at 1:30 AM with difficulty walking since. Reportedly had nonfocal neuro exam except for unsteadiness. MRI didn't show any acute abnormality. Thought to have vertigo issues. Also treated for poss UTI with Keflex and meclizine for vertigo. Hgb was 12.7, down from 14.3 2 yrs ago. Elevated creatinine but similar to prev reading 1 yr ago.  Pt states that dizziness has improved but he is still very fatigued. States he has just been laying around in his bed as he has not had the energy to get up to go to work, which is very unusual for him. He has been compliant with Keflex (has 3 tabs left) and has been taking meclizine 3 times/day. Denies blood in stools, melena, focal weakness, slurred speech, new/worsening HA, cp, sob, chest tightness.  Insomnia Pt states that he used to break Ambien 10 mg tab in 1/2 but when the tumor was discovered, he was stressed out and unable to sleep so he started 10 mg tab. Denies difficulty falling asleep;  states his issue is staying asleep or difficulty returning to sleep after he has gotten up to use the restroom. Pt stopped Ambien 3-5 days ago.  Patient Active Problem List   Diagnosis Date Noted  . Aortic stenosis 03/10/2014  . Left carotid bruit 03/10/2014  . Glaucoma   . Hypertension   . Enlarged prostate   . Coronary artery disease   . Peripheral vascular disease ([place])   . Dizziness   . Hyperlipidemia   . GERD (gastroesophageal reflux disease)   . Gout   . Exertional shortness of breath   . Iron deficiency anemia   . Migraine   . Arthritis   . S/P laparoscopic cholecystectomy 01/25/2013  . Thrombosed external hemorrhoid 03/25/2012   Past Medical History:  Diagnosis Date  . Arthritis    "hands, knees, elbows, fingers" (01/25/2013)  . Coronary artery disease    HX OF HEART STENTS X 2   2010--DR. H. SMITH IS PT'S CARDIOLOGIST, MILD AORTIC STENOSIS  . Dizziness   . Enlarged prostate    FLOMAX HELPS PT VOID  . Exertional shortness of breath   . GERD (gastroesophageal reflux disease)    OTC PREVACID AS NEEDED  . Glaucoma    BOTH EYES  . Gout   . Heart murmur   . Hyperlipidemia   . Hypertension   . Hypertension   . Iron deficiency anemia    hx  . Migraine    "  monthly" (01/25/2013)  . Peripheral vascular disease ([place])    CLAUDICATION   Past Surgical History:  Procedure Laterality Date  . CHOLECYSTECTOMY N/A 01/25/2013   Procedure: LAPAROSCOPIC CHOLECYSTECTOMY WITH INTRAOPERATIVE CHOLANGIOGRAM;  Surgeon: [name], MD;  Location: [facility];  Service: General;  Laterality: N/A;  . CORONARY ANGIOPLASTY WITH STENT PLACEMENT  11/27/2008   "2" (01/25/2013)  . EUS  12/24/2011   Procedure: FULL UPPER ENDOSCOPIC ULTRASOUND (EUS) RADIAL;  Surgeon: [name], MD;  Location: [facility];  Service: Endoscopy;  Laterality: N/A;  . INCISION AND DRAINAGE     thrombosed hemorrhoid  . LAPAROSCOPIC CHOLECYSTECTOMY  01/25/2013   Allergies  Allergen Reactions  . Codeine  Itching   Prior to Admission medications   Medication Sig Start Date End Date Taking? Authorizing Provider  acetaminophen (TYLENOL) 500 MG tablet Take 1,500 mg by mouth every 6 (six) hours as needed for moderate pain.    [provider]  Ascorbic Acid (VITAMIN C PO) Take 2 tablets by mouth daily.     [provider]  aspirin 81 MG tablet Take 81 mg by mouth daily.    [provider]  cephALEXin (KEFLEX) 500 MG capsule Take 1 capsule (500 mg total) by mouth 2 (two) times daily for 10 days. 09/03/17 09/13/17  Dorie Rank, MD  clomiPHENE (CLOMID) 50 MG tablet Take 50 mg by mouth every other day. 08/01/17   [provider]  cyclobenzaprine (FLEXERIL) 5 MG tablet Take 1 tablet (5 mg total) at bedtime as needed by mouth for muscle spasms. Patient not taking: Reported on 04/24/2017 12/03/16   Maczis, Barth Kirks, PA-C  meclizine (ANTIVERT) 25 MG tablet Take 1 tablet (25 mg total) by mouth 3 (three) times daily as needed for up to 21 days for dizziness. 09/03/17 09/24/17  Dorie Rank, MD  metoprolol succinate (TOPROL-XL) 25 MG 24 hr tablet TAKE 1 TABLET BY MOUTH EVERY DAY 09/03/17   Belva Crome, MD  Multiple Vitamin (MULTIVITAMIN WITH MINERALS) TABS Take 1 tablet by mouth daily after breakfast. Reported on 03/06/2015    [provider]  rosuvastatin (CRESTOR) 5 MG tablet TAKE 1 TABLET BY MOUTH 5 TIMES A WEEK, NEED APPT FOR REFILLS 08/06/17   Isaiah Serge, NP  tamsulosin (FLOMAX) 0.4 MG CAPS capsule Take 0.8 mg by mouth at bedtime.  02/24/14   [provider]  traMADol (ULTRAM) 50 MG tablet Take 1 tablet (50 mg total) by mouth every 6 (six) hours as needed. Patient not taking: Reported on 12/03/2016 11/15/16   Dorie Rank, MD  travoprost, benzalkonium, (TRAVATAN) 0.004 % ophthalmic solution Place 1 drop into both eyes at bedtime.     [provider]  zolpidem (AMBIEN) 10 MG tablet TAKE 1 TABLET(10 MG) BY MOUTH AT BEDTIME AS NEEDED FOR SLEEP Patient taking  differently: Take 10 mg by mouth at bedtime as needed for sleep.  08/31/17   McVey, Gelene Mink, PA-C   Social History   Socioeconomic History  . Marital status: Married    Spouse name: Not on file  . Number of children: Not on file  . Years of education: Not on file  . Highest education level: Not on file  Occupational History  . Not on file  Social Needs  . Financial resource strain: Not on file  . Food insecurity:    Worry: Not on file    Inability: Not on file  . Transportation needs:    Medical: Not on file    Non-medical: Not  on file  Tobacco Use  . Smoking status: Never Smoker  . Smokeless tobacco: Never Used  Substance and Sexual Activity  . Alcohol use: No  . Drug use: No  . Sexual activity: Never  Lifestyle  . Physical activity:    Days per week: Not on file    Minutes per session: Not on file  . Stress: Not on file  Relationships  . Social connections:    Talks on phone: Not on file    Gets together: Not on file    Attends religious service: Not on file    Active member of club or organization: Not on file    Attends meetings of clubs or organizations: Not on file    Relationship status: Not on file  . Intimate partner violence:    Fear of current or ex partner: Not on file    Emotionally abused: Not on file    Physically abused: Not on file    Forced sexual activity: Not on file  Other Topics Concern  . Not on file  Social History Narrative  . Not on file   Review of Systems  Constitutional: Positive for fatigue.  Respiratory: Negative for chest tightness and shortness of breath.   Cardiovascular: Negative for chest pain.  Gastrointestinal: Negative for blood in stool.  Neurological: Positive for dizziness. Negative for speech difficulty and weakness. Headaches: chronic, unchanged.  Psychiatric/Behavioral: Positive for sleep disturbance.      Objective:   Physical Exam  Constitutional: He is oriented to person, place, and time. He appears  well-developed and well-nourished. No distress.  HENT:  Head: Normocephalic and atraumatic.  Eyes: Conjunctivae and EOM are normal. Right eye exhibits no nystagmus. Left eye exhibits no nystagmus.  Neck: Neck supple. No tracheal deviation present.  Cardiovascular: Normal rate.  Pulmonary/Chest: Effort normal. No respiratory distress.  Musculoskeletal: Normal range of motion.  Neurological: He is alert and oriented to person, place, and time.  Skin: Skin is warm and dry.  Psychiatric: He has a normal mood and affect. His behavior is normal.  Nursing note and vitals reviewed.  Vitals:   09/07/17 1457  BP: 111/65  Pulse: 85  Temp: 97.9 F (36.6 C)  TempSrc: Oral  SpO2: 99%  Weight: 164 lb (74.4 kg)  Height: 5\' 6"  (1.676 m)   Results for orders placed or performed in visit on 09/07/17  POCT glucose (manual entry)  Result Value Ref Range   POC Glucose 88 70 - 99 mg/dl  POCT CBC  Result Value Ref Range   WBC 4.3 (A) 4.6 - 10.2 K/uL   Lymph, poc 1.9 0.6 - 3.4   POC LYMPH PERCENT 43.6 10 - 50 %L   MID (cbc) 0.5 0 - 0.9   POC MID % 10.5 0 - 12 %M   POC Granulocyte 2.0 2 - 6.9   Granulocyte percent 45.9 37 - 80 %G   RBC 4.81 4.69 - 6.13 M/uL   Hemoglobin 12.9 (A) 14.1 - 18.1 g/dL   HCT, POC 40.3 (A) 43.5 - 53.7 %   MCV 83.9 80 - 97 fL   MCH, POC 26.8 (A) 27 - 31.2 pg   MCHC 32.0 31.8 - 35.4 g/dL   RDW, POC 13.7 %   Platelet Count, POC 230 142 - 424 K/uL   MPV 7.3 0 - 99.8 fL      Assessment & Plan:     PRAKASH KIMBERLING is a 72 y.o. male Fatigue, unspecified type - Plan: POCT  glucose (manual entry), POCT CBC, Basic metabolic panel Vertigo Insomnia, unAnemia, unspecified type Elevated serum creatinine - Plan: Basic metabolic panelspecified type - Plan: Suvorexant (BELSOMRA) 10 MG TABS  - fatigue may be multifactorial. HGB stable from ER. Recent vertigo improving. Afebrile and tolerating abx for UTI, reassuring WBC. Meclizine may be contributing as well.  - as vertigo  improved, can taper back on meclizine as tolerated.   - repeat BMP  -decreased sleep may also impact fatigue.  On further discussion of sleep - appeared to be more early awakening. Initially tried Belsomra, but on phone call few days later - persistent symptoms. Option of continued trial, but will also rx ambien 5mg  to see if that may be effective as over 65yo.   -follow up with [facility] in 1 week, RTC precautions if worse sooner.   Meds ordered this encounter  Medications  . Suvorexant (BELSOMRA) 10 MG TABS    Sig: Take 10 mg by mouth at bedtime as needed.    Dispense:  10 tablet    Refill:  2   Patient Instructions   Fatigue may be related to multiple causes.  If vertigo is improving, can decrease meclizine to twice per day for the next few days, then continue to taper back as symptoms are controlled.  I will check blood count today, but less likely cause.  Decreased sleep may also be contributing to fatigue.  I would recommend starting Belsomra as that may help nighttime awakening better than the Ambien, and may have less potential side effects.  Please follow-up with [facility] in 1 week to recheck fatigue and sleep, sooner if worse.  Return to the clinic or go to the nearest emergency room if any of your symptoms worsen or new symptoms occur.   Fatigue Fatigue is feeling tired all of the time, a lack of energy, or a lack of motivation. Occasional or mild fatigue is often a normal response to activity or life in general. However, long-lasting (chronic) or extreme fatigue may indicate an underlying medical condition. Follow these instructions at home: Watch your fatigue for any changes. The following actions may help to lessen any discomfort you are feeling:  Talk to your health care provider about how much sleep you need each night. Try to get the required amount every night.  Take medicines only as directed by your health care provider.  Eat a healthy and nutritious diet. Ask your health  care provider if you need help changing your diet.  Drink enough fluid to keep your urine clear or pale yellow.  Practice ways of relaxing, such as yoga, meditation, massage therapy, or acupuncture.  Exercise regularly.  Change situations that cause you stress. Try to keep your work and personal routine reasonable.  Do not abuse illegal drugs.  Limit alcohol intake to no more than 1 drink per day for nonpregnant women and 2 drinks per day for men. One drink equals 12 ounces of beer, 5 ounces of wine, or 1 ounces of hard liquor.  Take a multivitamin, if directed by your health care provider.  Contact a health care provider if:  Your fatigue does not get better.  You have a fever.  You have unintentional weight loss or gain.  You have headaches.  You have difficulty: ? Falling asleep. ? Sleeping throughout the night.  You feel angry, guilty, anxious, or sad.  You are unable to have a bowel movement (constipation).  You skin is dry.  Your legs or another part of your  body is swollen. Get help right away if:  You feel confused.  Your vision is blurry.  You feel faint or pass out.  You have a severe headache.  You have severe abdominal, pelvic, or back pain.  You have chest pain, shortness of breath, or an irregular or fast heartbeat.  You are unable to urinate or you urinate less than normal.  You develop abnormal bleeding, such as bleeding from the rectum, vagina, nose, lungs, or nipples.  You vomit blood.  You have thoughts about harming yourself or committing suicide.  You are worried that you might harm someone else. This information is not intended to replace advice given to you by your health care provider. Make sure you discuss any questions you have with your health care provider. Document Released: 11/10/2006 Document Revised: 06/21/2015 Document Reviewed: 05/17/2013 Elsevier Interactive Patient Education  2018 [employer].    IF you received  an x-ray today, you will receive an invoice from [facility]. Please contact [facility] at [phone] with questions or concerns regarding your invoice.   IF you received labwork today, you will receive an invoice from [facility]. Please contact [facility] at [phone] with questions or concerns regarding your invoice.   Our billing staff will not be able to assist you with questions regarding bills from these companies.  You will be contacted with the lab results as soon as they are available. The fastest way to get your results is to activate your My Chart account. Instructions are located on the last page of this paperwork. If you have not heard from [place] regarding the results in 2 weeks, please contact this office.       I personally performed the services described in this documentation, which was scribed in my presence. The recorded information has been reviewed and considered for accuracy and completeness, addended by me as needed, and agree with information above.  Signed,   Merri Ray, MD [facility] at [place] [facility] [place].  09/10/17 6:39 PM

## 2017-09-08 ENCOUNTER — Telehealth: Payer: Self-pay | Admitting: General Practice

## 2017-09-08 LAB — BASIC METABOLIC PANEL
BUN/Creatinine Ratio: 12 (ref 10–24)
BUN: 18 mg/dL (ref 8–27)
CO2: 19 mmol/L — ABNORMAL LOW (ref 20–29)
Calcium: 9.4 mg/dL (ref 8.6–10.2)
Chloride: 107 mmol/L — ABNORMAL HIGH (ref 96–106)
Creatinine, Ser: 1.52 mg/dL — ABNORMAL HIGH (ref 0.76–1.27)
GFR calc Af Amer: 53 mL/min/{1.73_m2} — ABNORMAL LOW (ref >59)
GFR calc non Af Amer: 46 mL/min/{1.73_m2} — ABNORMAL LOW (ref >59)
Glucose: 102 mg/dL — ABNORMAL HIGH (ref 65–99)
Potassium: 4.2 mmol/L (ref 3.5–5.2)
Sodium: 139 mmol/L (ref 134–144)

## 2017-09-08 NOTE — Telephone Encounter (Signed)
Copied from [place] [phone]. Topic: Quick Communication - See Telephone Encounter >> Sep 08, 2017  9:36 AM Mylinda Latina, NT wrote: CRM for notification. See Telephone encounter for: 09/08/17. Patient wife called and states that the patient wanted to thank Dr. Carlota Raspberry for his service as well as helping him with the copay card to get his  medication. The patient states that he was prescribed a sleep aid  (Suvorexant (BELSOMRA) 10 MG TABS) and he states that he didn't sleep well after he took the medication. Patient is wondering if the medication has to get in his system before he can notice a difference.  Please advise. Thank you CB# [phone] or [phone] Can leave message if no one answers.

## 2017-09-08 NOTE — Telephone Encounter (Signed)
Spoke with pt via phone ambien refused and he will need to be seen for further refills, pt agreeable an advises he has an appt on 09/16/17.  Advised to address the ambien at that appt, pt agreeable an appreciative of c/b. Dgaddy, CMa

## 2017-09-09 NOTE — Telephone Encounter (Signed)
It does sometimes take a few days for that to be effective.  Would continue to try the Belsomra for a week and then if not noticing improvement can look at other options.  Thank you.

## 2017-09-10 ENCOUNTER — Telehealth: Payer: Self-pay | Admitting: Physician Assistant

## 2017-09-10 MED ORDER — ZOLPIDEM TARTRATE 5 MG PO TABS: 5.0000 mg | ORAL_TABLET | Freq: Every evening | ORAL | 0 refills | Status: DC | PRN

## 2017-09-10 NOTE — Telephone Encounter (Signed)
Copied from [place] [phone]. Topic: Quick Communication - See Telephone Encounter >> Sep 10, 2017  9:41 AM Neva Seat wrote: Pt is needing something else to help him sleep.  The medication prescribed by Dr. Carlota Raspberry is not helping at all.  Pt has to work and function each day but it's very hard with him not getting any sleep. Pt's wife said the Ambien helped. Please call something in to help him sleep today if at all possible.

## 2017-09-10 NOTE — Telephone Encounter (Signed)
See prior message, but if not getting results with belsomra, I did send in Ambien at 5mg  - can try that dose initially as over age 72. Let me know how that works.

## 2017-09-10 NOTE — Telephone Encounter (Signed)
Left a detailed message.

## 2017-09-11 NOTE — Telephone Encounter (Signed)
Called patient no answer VM full unable to leave message

## 2017-09-14 NOTE — Telephone Encounter (Signed)
Unable to reach patient.

## 2017-09-15 ENCOUNTER — Encounter: Payer: Self-pay | Admitting: Interventional Cardiology

## 2017-09-16 ENCOUNTER — Other Ambulatory Visit: Payer: Self-pay

## 2017-09-16 ENCOUNTER — Ambulatory Visit (INDEPENDENT_AMBULATORY_CARE_PROVIDER_SITE_OTHER): Payer: Medicare Other | Admitting: Physician Assistant

## 2017-09-16 ENCOUNTER — Encounter: Payer: Self-pay | Admitting: *Deleted

## 2017-09-16 ENCOUNTER — Encounter: Payer: Self-pay | Admitting: Physician Assistant

## 2017-09-16 VITALS — BP 109/62 | HR 71 | Temp 99.3°F | Resp 18 | Ht 66.0 in | Wt 167.6 lb

## 2017-09-16 DIAGNOSIS — G4700 Insomnia, unspecified: Secondary | ICD-10-CM | POA: Diagnosis not present

## 2017-09-16 MED ORDER — ZOLPIDEM TARTRATE 10 MG PO TABS: 10.0000 mg | ORAL_TABLET | Freq: Every evening | ORAL | 1 refills | Status: DC | PRN

## 2017-09-16 NOTE — Patient Instructions (Addendum)
See you in Sept for your annual exam.   Zolpidem tablets What is this medicine? ZOLPIDEM (zole PI dem) is used to treat insomnia. This medicine helps you to fall asleep and sleep through the night. This medicine may be used for other purposes; ask your health care provider or pharmacist if you have questions. COMMON BRAND NAME(S): Ambien What should I tell my health care provider before I take this medicine? They need to know if you have any of these conditions: -depression -history of drug abuse or addiction -if you often drink alcohol -liver disease -lung or breathing disease -myasthenia gravis -sleep apnea -suicidal thoughts, plans, or attempt; a previous suicide attempt by you or a family member -an unusual or allergic reaction to zolpidem, other medicines, foods, dyes, or preservatives -pregnant or trying to get pregnant -breast-feeding How should I use this medicine? Take this medicine by mouth with a glass of water. Follow the directions on the prescription label. It is better to take this medicine on an empty stomach and only when you are ready for bed. Do not take your medicine more often than directed. If you have been taking this medicine for several weeks and suddenly stop taking it, you may get unpleasant withdrawal symptoms. Your doctor or health care professional may want to gradually reduce the dose. Do not stop taking this medicine on your own. Always follow your doctor or health care professional's advice. A special MedGuide will be given to you by the pharmacist with each prescription and refill. Be sure to read this information carefully each time. Talk to your pediatrician regarding the use of this medicine in children. Special care may be needed. Overdosage: If you think you have taken too much of this medicine contact a poison control center or emergency room at once. NOTE: This medicine is only for you. Do not share this medicine with others. What if I miss a  dose? This does not apply. This medicine should only be taken immediately before going to sleep. Do not take double or extra doses. What may interact with this medicine? -alcohol -antihistamines for allergy, cough and cold -certain medicines for anxiety or sleep -certain medicines for depression, like amitriptyline, fluoxetine, sertraline -certain medicines for fungal infections like ketoconazole and itraconazole -certain medicines for seizures like phenobarbital, primidone -ciprofloxacin -dietary supplements for sleep, like valerian or kava kava -general anesthetics like halothane, isoflurane, methoxyflurane, propofol -local anesthetics like lidocaine, pramoxine, tetracaine -medicines that relax muscles for surgery -narcotic medicines for pain -phenothiazines like chlorpromazine, mesoridazine, prochlorperazine, thioridazine -rifampin This list may not describe all possible interactions. Give your health care provider a list of all the medicines, herbs, non-prescription drugs, or dietary supplements you use. Also tell them if you smoke, drink alcohol, or use illegal drugs. Some items may interact with your medicine. What should I watch for while using this medicine? Visit your doctor or health care professional for regular checks on your progress. Keep a regular sleep schedule by going to bed at about the same time each night. Avoid caffeine-containing drinks in the evening hours. When sleep medicines are used every night for more than a few weeks, they may stop working. Talk to your doctor if you still have trouble sleeping. After taking this medicine for sleep, you may get up out of bed while not being fully awake and do an activity that you do not know you are doing. The next morning, you may have no memory of the event. Activities such as driving a  car ("sleep-driving"), making and eating food, talking on the phone, sexual activity, and sleep-walking have been reported. Call your doctor right  away if you find out you have done any of these activities. Do not take this medicine if you have used alcohol that evening or before bed or taken another medicine for sleep since your risk of doing these sleep-related activities will be increased. Wait for at least 8 hours after you take a dose before driving or doing other activities that require full mental alertness. Do not take this medicine unless you are able to stay in bed for a full night (7 to 8 hours) before you must be active again. You may have a decrease in mental alertness the day after use, even if you feel that you are fully awake. Tell your doctor if you will need to perform activities requiring full alertness, such as driving, the next day. Do not stand or sit up quickly after taking this medicine, especially if you are an older patient. This reduces the risk of dizzy or fainting spells. If you or your family notice any changes in your behavior, such as new or worsening depression, thoughts of harming yourself, anxiety, other unusual or disturbing thoughts, or memory loss, call your doctor right away. After you stop taking this medicine, you may have trouble falling asleep. This is called rebound insomnia. This problem usually goes away on its own after 1 or 2 nights. What side effects may I notice from receiving this medicine? Side effects that you should report to your doctor or health care professional as soon as possible: -allergic reactions like skin rash, itching or hives, swelling of the face, lips, or tongue -breathing problems -changes in vision -confusion -depressed mood or other changes in moods or emotions -feeling faint or lightheaded, falls -hallucinations -loss of balance or coordination -loss of memory -numbness or tingling of the tongue -restlessness, excitability, or feelings of anxiety or agitation -signs and symptoms of liver injury like dark yellow or brown urine; general ill feeling or flu-like symptoms;  light-colored stools; loss of appetite; nausea; right upper belly pain; unusually weak or tired; yellowing of the eyes or skin -suicidal thoughts -unusual activities while asleep like driving, eating, making phone calls, or sexual activity Side effects that usually do not require medical attention (report to your doctor or health care professional if they continue or are bothersome): -dizziness -drowsiness the day after you take this medicine -headache This list may not describe all possible side effects. Call your doctor for medical advice about side effects. You may report side effects to FDA at 1-800-FDA-1088. Where should I keep my medicine? Keep out of the reach of children. This medicine can be abused. Keep your medicine in a safe place to protect it from theft. Do not share this medicine with anyone. Selling or giving away this medicine is dangerous and against the law. This medicine may cause accidental overdose and death if taken by other adults, children, or pets. Mix any unused medicine with a substance like cat litter or coffee grounds. Then throw the medicine away in a sealed container like a sealed bag or a coffee can with a lid. Do not use the medicine after the expiration date. Store at room temperature between 20 and 25 degrees C (68 and 77 degrees F). NOTE: This sheet is a summary. It may not cover all possible information. If you have questions about this medicine, talk to your doctor, pharmacist, or health care provider.  2018 Elsevier/Gold  Standard (2015-04-18 14:38:20)  IF you received an x-ray today, you will receive an invoice from [facility]. Please contact [facility] at [phone] with questions or concerns regarding your invoice.   IF you received labwork today, you will receive an invoice from [facility]. Please contact [facility] at [phone] with questions or concerns regarding your invoice.   Our billing staff will not be able to assist you with  questions regarding bills from these companies.  You will be contacted with the lab results as soon as they are available. The fastest way to get your results is to activate your My Chart account. Instructions are located on the last page of this paperwork. If you have not heard from [place] regarding the results in 2 weeks, please contact this office.

## 2017-09-16 NOTE — Progress Notes (Signed)
Robert Estes [facility]  MRN: [id number] DOB: April 03, 1945  PCP: Patient, No Pcp Per  Subjective:  Pt is a 72 year old male who presents to clinic for f/u insomnia. He was here for this problem 8/12 and saw Dr. Carlota Raspberry.  Started on Belsomra 10mg .  Belsomra did not work at all for him.  He has had a problem with sleeping in the past.  Used to break Ambien 10 mg tab in 1/2 but when the tumor was discovered, he was stressed out and unable to sleep so he started 10 mg tab. He plans to research doctors from [place] to have an operation for removal.  He is not sleeping well at night.  Denies difficulty falling asleep; states his issue is staying asleep or difficulty returning to sleep after he has gotten up to use the restroom. He would like to start back on Ambien.  Takes this 4-5 times a week  Pt  has a past medical history of Arthritis, Coronary artery disease, Dizziness, Enlarged prostate, Exertional shortness of breath, GERD (gastroesophageal reflux disease), Glaucoma, Gout, Heart murmur, Hyperlipidemia, Hypertension, Hypertension, Iron deficiency anemia, Migraine, and Peripheral vascular disease ([place]).  Review of Systems  Gastrointestinal: Negative for abdominal pain, nausea and vomiting.  Psychiatric/Behavioral: Positive for sleep disturbance. The patient is nervous/anxious.     Patient Active Problem List   Diagnosis Date Noted  . Aortic stenosis 03/10/2014  . Left carotid bruit 03/10/2014  . Glaucoma   . Hypertension   . Enlarged prostate   . Coronary artery disease   . Peripheral vascular disease ([place])   . Dizziness   . Hyperlipidemia   . GERD (gastroesophageal reflux disease)   . Gout   . Exertional shortness of breath   . Iron deficiency anemia   . Migraine   . Arthritis   . S/P laparoscopic cholecystectomy 01/25/2013  . Thrombosed external hemorrhoid 03/25/2012    Current Outpatient Medications on File Prior to Visit  Medication Sig Dispense Refill  . acetaminophen (TYLENOL) 500  MG tablet Take 1,500 mg by mouth every 6 (six) hours as needed for moderate pain.    Marland Kitchen aspirin 81 MG tablet Take 81 mg by mouth daily.    . cyclobenzaprine (FLEXERIL) 5 MG tablet Take 1 tablet (5 mg total) at bedtime as needed by mouth for muscle spasms. 12 tablet 0  . tamsulosin (FLOMAX) 0.4 MG CAPS capsule Take 0.8 mg by mouth at bedtime.   0  . travoprost, benzalkonium, (TRAVATAN) 0.004 % ophthalmic solution Place 1 drop into both eyes at bedtime.     . Ascorbic Acid (VITAMIN C PO) Take 2 tablets by mouth daily.     . clomiPHENE (CLOMID) 50 MG tablet Take 50 mg by mouth every other day.  11  . meclizine (ANTIVERT) 25 MG tablet Take 1 tablet (25 mg total) by mouth 3 (three) times daily as needed for up to 21 days for dizziness. (Patient not taking: Reported on 09/16/2017) 30 tablet 0  . metoprolol succinate (TOPROL-XL) 25 MG 24 hr tablet TAKE 1 TABLET BY MOUTH EVERY DAY (Patient not taking: Reported on 09/16/2017) 30 tablet 0  . Multiple Vitamin (MULTIVITAMIN WITH MINERALS) TABS Take 1 tablet by mouth daily after breakfast. Reported on 03/06/2015    . rosuvastatin (CRESTOR) 5 MG tablet TAKE 1 TABLET BY MOUTH 5 TIMES A WEEK, NEED APPT FOR REFILLS (Patient not taking: Reported on 09/16/2017) 25 tablet 0  . Suvorexant (BELSOMRA) 10 MG TABS Take 10 mg by mouth at  bedtime as needed. (Patient not taking: Reported on 09/16/2017) 10 tablet 2  . zolpidem (AMBIEN) 5 MG tablet Take 1 tablet (5 mg total) by mouth at bedtime as needed for sleep. (Patient not taking: Reported on 09/16/2017) 30 tablet 0   No current facility-administered medications on file prior to visit.     Allergies  Allergen Reactions  . Codeine Itching     Objective:  BP 109/62   Pulse 71   Temp 99.3 F (37.4 C) (Oral)   Resp 18   Ht 5\' 6"  (1.676 m)   Wt 167 lb 9.6 oz (76 kg)   SpO2 99%   BMI 27.05 kg/m   Physical Exam  Constitutional: He is oriented to person, place, and time. He appears well-developed and well-nourished.    Cardiovascular: Normal rate and regular rhythm.  Neurological: He is alert and oriented to person, place, and time.  Skin: Skin is warm and dry.  Psychiatric: He has a normal mood and affect. His behavior is normal. Judgment and thought content normal.  Vitals reviewed.   Assessment and Plan :  1. Insomnia, unspecified type -Patient presents for refill of Ambien.  Recently tried Belsomra which did not improve his symptoms.   - zolpidem (AMBIEN) 10 MG tablet; Take 1 tablet (10 mg total) by mouth at bedtime as needed for sleep.  Dispense: 30 tablet; Refill: 1   [name], PA-C  [facility] [place] 09/16/2017 4:24 PM  Please note: Portions of this report may have been transcribed using dragon voice recognition software. Every effort was made to ensure accuracy; however, inadvertent computerized transcription errors may be present.

## 2017-09-19 ENCOUNTER — Other Ambulatory Visit: Payer: Self-pay | Admitting: Cardiology

## 2017-09-19 DIAGNOSIS — E785 Hyperlipidemia, unspecified: Secondary | ICD-10-CM

## 2017-09-21 ENCOUNTER — Other Ambulatory Visit: Payer: Self-pay | Admitting: Otolaryngology

## 2017-09-21 ENCOUNTER — Other Ambulatory Visit: Payer: Self-pay | Admitting: Interventional Cardiology

## 2017-09-21 DIAGNOSIS — E785 Hyperlipidemia, unspecified: Secondary | ICD-10-CM

## 2017-09-21 NOTE — Telephone Encounter (Signed)
Spoke with pt and clarified that he is still taking Rosuvastatin.  Ok to fill.

## 2017-09-21 NOTE — Telephone Encounter (Signed)
Please advise on refill request as this medication is not on patients current med list. It was removed at 09/16/17 office visit at primary care at [facility] with a reason of patient reported not taking. Thanks, MI

## 2017-09-23 DIAGNOSIS — H401131 Primary open-angle glaucoma, bilateral, mild stage: Secondary | ICD-10-CM | POA: Diagnosis not present

## 2017-10-01 ENCOUNTER — Other Ambulatory Visit: Payer: Self-pay | Admitting: Interventional Cardiology

## 2017-10-02 ENCOUNTER — Encounter (HOSPITAL_COMMUNITY): Admission: RE | Payer: Self-pay | Source: Ambulatory Visit

## 2017-10-02 ENCOUNTER — Inpatient Hospital Stay (HOSPITAL_COMMUNITY): Admission: RE | Admit: 2017-10-02 | Payer: Medicare Other | Source: Ambulatory Visit | Admitting: Neurosurgery

## 2017-10-02 SURGERY — CRANIOTOMY HYPOPHYSECTOMY TRANSNASAL APPROACH
Anesthesia: General
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
## 2017-10-08 ENCOUNTER — Encounter: Payer: Self-pay | Admitting: Interventional Cardiology

## 2017-10-08 ENCOUNTER — Telehealth: Payer: Self-pay | Admitting: *Deleted

## 2017-10-08 ENCOUNTER — Ambulatory Visit: Payer: Medicare Other | Admitting: Interventional Cardiology

## 2017-10-08 VITALS — BP 116/62 | HR 72 | Ht 63.0 in | Wt 169.0 lb

## 2017-10-08 DIAGNOSIS — I10 Essential (primary) hypertension: Secondary | ICD-10-CM

## 2017-10-08 DIAGNOSIS — I739 Peripheral vascular disease, unspecified: Secondary | ICD-10-CM | POA: Diagnosis not present

## 2017-10-08 DIAGNOSIS — E7849 Other hyperlipidemia: Secondary | ICD-10-CM | POA: Diagnosis not present

## 2017-10-08 DIAGNOSIS — I2510 Atherosclerotic heart disease of native coronary artery without angina pectoris: Secondary | ICD-10-CM

## 2017-10-08 DIAGNOSIS — I350 Nonrheumatic aortic (valve) stenosis: Secondary | ICD-10-CM

## 2017-10-08 DIAGNOSIS — R400 Somnolence: Secondary | ICD-10-CM

## 2017-10-08 DIAGNOSIS — Z01810 Encounter for preprocedural cardiovascular examination: Secondary | ICD-10-CM

## 2017-10-08 LAB — HEPATIC FUNCTION PANEL
ALT: 36 IU/L (ref 0–44)
AST: 26 IU/L (ref 0–40)
Albumin: 4.6 g/dL (ref 3.6–4.8)
Alkaline Phosphatase: 64 IU/L (ref 39–117)
Bilirubin Total: 0.3 mg/dL (ref 0.0–1.2)
Bilirubin, Direct: 0.13 mg/dL (ref 0.00–0.40)
Total Protein: 6.8 g/dL (ref 6.0–8.5)

## 2017-10-08 LAB — BASIC METABOLIC PANEL
BUN/Creatinine Ratio: 13 (ref 10–24)
BUN: 21 mg/dL (ref 8–27)
CO2: 19 mmol/L — ABNORMAL LOW (ref 20–29)
Calcium: 9.4 mg/dL (ref 8.6–10.2)
Chloride: 105 mmol/L (ref 96–106)
Creatinine, Ser: 1.64 mg/dL — ABNORMAL HIGH (ref 0.76–1.27)
GFR calc Af Amer: 49 mL/min/{1.73_m2} — ABNORMAL LOW (ref >59)
GFR calc non Af Amer: 42 mL/min/{1.73_m2} — ABNORMAL LOW (ref >59)
Glucose: 93 mg/dL (ref 65–99)
Potassium: 4.7 mmol/L (ref 3.5–5.2)
Sodium: 139 mmol/L (ref 134–144)

## 2017-10-08 LAB — LIPID PANEL
Chol/HDL Ratio: 5.5 ratio — ABNORMAL HIGH (ref 0.0–5.0)
Cholesterol, Total: 144 mg/dL (ref 100–199)
HDL: 26 mg/dL — ABNORMAL LOW (ref >39)
LDL Calculated: 91 mg/dL (ref 0–99)
Triglycerides: 134 mg/dL (ref 0–149)
VLDL Cholesterol Cal: 27 mg/dL (ref 5–40)

## 2017-10-08 MED ORDER — METOPROLOL SUCCINATE ER 25 MG PO TB24: 25.0000 mg | ORAL_TABLET | Freq: Every day | ORAL | 3 refills | Status: DC

## 2017-10-08 NOTE — Telephone Encounter (Signed)
-----   Message from Freada Bergeron, [place] sent at 10/08/2017 10:15 AM EDT ----- Regarding: pre cert Split night

## 2017-10-08 NOTE — Patient Instructions (Signed)
Medication Instructions:  1) Take your Metoprolol everyday  Labwork: BMET, Lipid and Liver today  Testing/Procedures: Your physician has requested that you have an echocardiogram. Echocardiography is a painless test that uses sound waves to create images of your heart. It provides your doctor with information about the size and shape of your heart and how well your heart's chambers and valves are working. This procedure takes approximately one hour. There are no restrictions for this procedure.  Your physician has recommended that you have a sleep study. This test records several body functions during sleep, including: brain activity, eye movement, oxygen and carbon dioxide blood levels, heart rate and rhythm, breathing rate and rhythm, the flow of air through your mouth and nose, snoring, body muscle movements, and chest and belly movement.   Follow-Up: Your physician wants you to follow-up in: 1 year with Dr. Tamala Julian.  You will receive a reminder letter in the mail two months in advance. If you don't receive a letter, please call our office to schedule the follow-up appointment.   Any Other Special Instructions Will Be Listed Below (If Applicable).     If you need a refill on your cardiac medications before your next appointment, please call your pharmacy.

## 2017-10-08 NOTE — Telephone Encounter (Signed)
Staff message sent to Robert Estes per [facility] web portal no PA is required for sleep study. Ok to schedule. Decision [id number].

## 2017-10-09 ENCOUNTER — Telehealth: Payer: Self-pay | Admitting: *Deleted

## 2017-10-09 NOTE — Telephone Encounter (Signed)
-----   Message from Lauralee Evener, [place] sent at 10/08/2017 12:18 PM EDT ----- Regarding: RE: pre cert Per [employer] web portal no PA is required. Decision [id number]. OK TO SCHEDULE. ----- Message ----- From: Freada Bergeron, CMA Sent: 10/08/2017  10:15 AM EDT To: Cv Div Sleep Studies Subject: pre cert                                       Split night

## 2017-10-09 NOTE — Telephone Encounter (Signed)
Patient is scheduled for lab study on 11/10/17. Patient understands his sleep study will be done at [facility] sleep lab. Patient understands he will receive a sleep packet in a week or so. Patient understands to call if he does not receive the sleep packet in a timely manner. Patient agrees with treatment and thanked me for call.

## 2017-10-12 ENCOUNTER — Other Ambulatory Visit: Payer: Self-pay

## 2017-10-12 ENCOUNTER — Ambulatory Visit (HOSPITAL_COMMUNITY): Payer: Medicare Other | Attending: Cardiology

## 2017-10-12 ENCOUNTER — Telehealth: Payer: Self-pay | Admitting: Interventional Cardiology

## 2017-10-12 ENCOUNTER — Other Ambulatory Visit: Payer: Self-pay | Admitting: Family Medicine

## 2017-10-12 DIAGNOSIS — I088 Other rheumatic multiple valve diseases: Secondary | ICD-10-CM | POA: Insufficient documentation

## 2017-10-12 DIAGNOSIS — I10 Essential (primary) hypertension: Secondary | ICD-10-CM | POA: Insufficient documentation

## 2017-10-12 DIAGNOSIS — E785 Hyperlipidemia, unspecified: Secondary | ICD-10-CM | POA: Diagnosis not present

## 2017-10-12 DIAGNOSIS — I2510 Atherosclerotic heart disease of native coronary artery without angina pectoris: Secondary | ICD-10-CM | POA: Insufficient documentation

## 2017-10-12 DIAGNOSIS — I082 Rheumatic disorders of both aortic and tricuspid valves: Secondary | ICD-10-CM | POA: Insufficient documentation

## 2017-10-12 DIAGNOSIS — I739 Peripheral vascular disease, unspecified: Secondary | ICD-10-CM | POA: Diagnosis not present

## 2017-10-12 DIAGNOSIS — R011 Cardiac murmur, unspecified: Secondary | ICD-10-CM | POA: Diagnosis not present

## 2017-10-12 DIAGNOSIS — I350 Nonrheumatic aortic (valve) stenosis: Secondary | ICD-10-CM | POA: Diagnosis not present

## 2017-10-12 NOTE — Telephone Encounter (Signed)
New Message:     Ann wants to be sure to thank Anderson Malta and Dr Tamala Julian for all they did to hellp Robert Estes. She wants to know hie is now scheduled for his Sleep Study on 11-10-17. ir his xus next thing She wish them the best and hope they will both have a good rest of the year,

## 2017-10-12 NOTE — Telephone Encounter (Signed)
Ambien refill Last Refill:09/16/17 #30 with 1 refill Last OV: 09/16/17 with Whitney McVey,PA

## 2017-10-16 ENCOUNTER — Ambulatory Visit: Payer: Medicare Other

## 2017-10-27 ENCOUNTER — Telehealth: Payer: Self-pay | Admitting: Physician Assistant

## 2017-10-27 ENCOUNTER — Other Ambulatory Visit: Payer: Self-pay | Admitting: *Deleted

## 2017-10-27 ENCOUNTER — Telehealth: Payer: Self-pay | Admitting: *Deleted

## 2017-10-27 MED ORDER — NITROGLYCERIN 0.4 MG SL SUBL: 0.4000 mg | SUBLINGUAL_TABLET | SUBLINGUAL | 5 refills | Status: DC | PRN

## 2017-10-27 NOTE — Telephone Encounter (Signed)
Please advise  Copied from [place] [phone]. Topic: Referral - Request >> Oct 27, 2017  3:28 PM Hewitt Shorts wrote: Pt is requesting a referral to Dr. Idamae Schuller at [facility]  phone number is [phone]  and fax [phone]   Best number  [phone]

## 2017-10-27 NOTE — Telephone Encounter (Signed)
Spoke with Dr. Tamala Julian and he said ok to fill Nitro 0.4mg  SL PRN CP.

## 2017-10-27 NOTE — Telephone Encounter (Signed)
Rx has been sent to requested pharmacy 

## 2017-10-27 NOTE — Telephone Encounter (Signed)
Patients wife called and requested a refill on nitro for the patient however this isn't listed on his current or historical med list. Okay to order? Please advise. Thanks, MI

## 2017-10-28 ENCOUNTER — Other Ambulatory Visit: Payer: Self-pay | Admitting: Interventional Cardiology

## 2017-10-28 DIAGNOSIS — E785 Hyperlipidemia, unspecified: Secondary | ICD-10-CM

## 2017-10-29 ENCOUNTER — Other Ambulatory Visit: Payer: Self-pay | Admitting: Physician Assistant

## 2017-10-29 DIAGNOSIS — D352 Benign neoplasm of pituitary gland: Secondary | ICD-10-CM

## 2017-10-29 NOTE — Telephone Encounter (Signed)
Referral placed.

## 2017-11-04 ENCOUNTER — Ambulatory Visit: Payer: Medicare Other | Admitting: Family Medicine

## 2017-11-05 ENCOUNTER — Encounter: Payer: Self-pay | Admitting: Physician Assistant

## 2017-11-05 ENCOUNTER — Other Ambulatory Visit: Payer: Self-pay

## 2017-11-05 ENCOUNTER — Ambulatory Visit (INDEPENDENT_AMBULATORY_CARE_PROVIDER_SITE_OTHER): Payer: Medicare Other | Admitting: Physician Assistant

## 2017-11-05 VITALS — BP 120/70 | HR 78 | Temp 98.6°F | Resp 16 | Ht 67.0 in | Wt 169.0 lb

## 2017-11-05 DIAGNOSIS — R05 Cough: Secondary | ICD-10-CM | POA: Diagnosis not present

## 2017-11-05 DIAGNOSIS — R059 Cough, unspecified: Secondary | ICD-10-CM

## 2017-11-05 DIAGNOSIS — J22 Unspecified acute lower respiratory infection: Secondary | ICD-10-CM

## 2017-11-05 MED ORDER — BENZONATATE 100 MG PO CAPS: 100.0000 mg | ORAL_CAPSULE | Freq: Three times a day (TID) | ORAL | 0 refills | Status: DC | PRN

## 2017-11-05 MED ORDER — PROMETHAZINE-PHENYLEPHRINE 6.25-5 MG/5ML PO SYRP: 5.0000 mL | ORAL_SOLUTION | ORAL | 0 refills | Status: DC | PRN

## 2017-11-05 MED ORDER — AZITHROMYCIN 250 MG PO TABS
ORAL_TABLET | ORAL | 0 refills | Status: DC
Start: 2017-11-05 — End: 2017-12-18

## 2017-11-05 NOTE — Progress Notes (Signed)
Robert Estes [facility]  MRN: [id number] DOB: 15-Sep-1945  PCP: Patient, No Pcp Per  Subjective:  Pt is a pleasant 72 year old male who presents to clinic for congestion x 6 days. Endorses dry cough.   Denies fever, chills.  Taken NyQuil.  He plans to have pituitary tumor removed at [facility] The next month.    has a past medical history of Arthritis, Coronary artery disease, Dizziness, Enlarged prostate, Exertional shortness of breath, GERD (gastroesophageal reflux disease), Glaucoma, Gout, Heart murmur, Hyperlipidemia, Hypertension, Hypertension, Iron deficiency anemia, Migraine, and Peripheral vascular disease ([place]).  Review of Systems  Constitutional: Negative for chills, diaphoresis, fatigue and fever.  HENT: Positive for rhinorrhea. Negative for congestion, postnasal drip, sinus pressure, sinus pain, sneezing and sore throat.   Respiratory: Positive for cough. Negative for shortness of breath and wheezing.     Patient Active Problem List   Diagnosis Date Noted  . Aortic stenosis 03/10/2014  . Left carotid bruit 03/10/2014  . Glaucoma   . Hypertension   . Enlarged prostate   . Coronary artery disease   . Peripheral vascular disease ([place])   . Dizziness   . Hyperlipidemia   . GERD (gastroesophageal reflux disease)   . Gout   . Exertional shortness of breath   . Iron deficiency anemia   . Migraine   . Arthritis   . S/P laparoscopic cholecystectomy 01/25/2013  . Thrombosed external hemorrhoid 03/25/2012    Current Outpatient Medications on File Prior to Visit  Medication Sig Dispense Refill  . acetaminophen (TYLENOL) 500 MG tablet Take 1,500 mg by mouth every 6 (six) hours as needed for moderate pain.    . metoprolol succinate (TOPROL-XL) 25 MG 24 hr tablet Take 1 tablet (25 mg total) by mouth daily. 90 tablet 3  . nitroGLYCERIN (NITROSTAT) 0.4 MG SL tablet Place 1 tablet (0.4 mg total) under the tongue every 5 (five) minutes as needed for chest pain. 25 tablet 5  . rosuvastatin  (CRESTOR) 5 MG tablet TAKE 1 TABLET BY MOUTH 5 TIMES A WEEK 25 tablet 10  . tamsulosin (FLOMAX) 0.4 MG CAPS capsule Take 0.8 mg by mouth at bedtime.   0  . travoprost, benzalkonium, (TRAVATAN) 0.004 % ophthalmic solution Place 1 drop into both eyes at bedtime.     Marland Kitchen zolpidem (AMBIEN) 10 MG tablet Take 1 tablet (10 mg total) by mouth at bedtime as needed for sleep. 30 tablet 1  . aspirin 81 MG tablet Take 81 mg by mouth daily.    . cyclobenzaprine (FLEXERIL) 5 MG tablet Take 1 tablet (5 mg total) at bedtime as needed by mouth for muscle spasms. (Patient not taking: Reported on 11/05/2017) 12 tablet 0   No current facility-administered medications on file prior to visit.     Allergies  Allergen Reactions  . Codeine Itching     Objective:  BP 120/70 (BP Location: Left Arm, Patient Position: Sitting, Cuff Size: Normal)   Pulse 78   Temp 98.6 F (37 C) (Oral)   Resp 16   Ht 5\' 7"  (1.702 m)   Wt 169 lb (76.7 kg)   SpO2 100%   BMI 26.47 kg/m   Physical Exam  Constitutional: He is oriented to person, place, and time. No distress.  HENT:  Right Ear: Tympanic membrane normal.  Left Ear: Tympanic membrane normal.  Nose: Mucosal edema present. No rhinorrhea. Right sinus exhibits no maxillary sinus tenderness and no frontal sinus tenderness. Left sinus exhibits no maxillary sinus tenderness and no frontal  sinus tenderness.  Mouth/Throat: Oropharynx is clear and moist and mucous membranes are normal.  Cardiovascular: Normal rate, regular rhythm and normal heart sounds.  Pulmonary/Chest: Effort normal and breath sounds normal. No respiratory distress. He has no wheezes. He has no rales.  Neurological: He is alert and oriented to person, place, and time.  Skin: Skin is warm and dry.  Psychiatric: Judgment normal.  Vitals reviewed.   Assessment and Plan :  1. Cough - pt presents c/o cough and congestion x 6 days. Considering PMH and age will cover if no improvement in 2-3 days. Z-pack Rx  printed out for pt, may fill in 2-3 days if symptoms worsen/fail to improve. Antibiotic stewardship discussed. RTC PRN.  - benzonatate (TESSALON) 100 MG capsule; Take 1-2 capsules (100-200 mg total) by mouth 3 (three) times daily as needed for cough.  Dispense: 40 capsule; Refill: 0 - promethazine-phenylephrine (PROMETHAZINE-PHENYLEPHRINE) 6.25-5 MG/5ML SYRP; Take 5 mLs by mouth every 4 (four) hours as needed for congestion.  Dispense: 280 mL; Refill: 0  2. Lower respiratory infection - azithromycin (ZITHROMAX) 250 MG tablet; Take 2 tabs PO x 1 dose, then 1 tab PO QD x 4 days  Dispense: 6 tablet; Refill: 0   Whitney Zaxton Angerer, PA-C  [facility] 11/05/2017 5:03 PM  Please note: Portions of this report may have been transcribed using dragon voice recognition software. Every effort was made to ensure accuracy; however, inadvertent computerized transcription errors may be present.

## 2017-11-05 NOTE — Patient Instructions (Addendum)
Tessalon is for cough during the day. This should not make you drowsy.  Mucinex will help thin out your mucus to help you clear it out. Drink plenty of water while taking this medication.  Try Flonase for your nasal congestion.  Azithromycin is an antibiotic. DO NOT TAKE THIS UNLESS YOU NEED IT. Fill this on Monday if you are still not improving.  Take the entire course of this medication, even if you start to feel better sooner.   Stay well hydrated. Get lost of rest. Wash your hands often.   -Foods that can help speed recovery: honey, garlic, chicken soup, elderberries, green tea.  -Supplements that can help speed recovery: vitamin C, zinc, elderberry extract, quercetin, ginseng, selenium -Supplement with prebiotics and probiotics:   For sore throat: ? Gargle with 8 oz of salt water ( tsp of salt per 1 qt of water) as often as every 1-2 hours to soothe your throat.   Cepacol throat lozenges   For sore throat try using a honey-based tea. Use 3 teaspoons of honey with juice squeezed from half lemon. Place shaved pieces of ginger into 1/2-1 cup of water and warm over stove top. Then mix the ingredients and repeat every 4 hours as needed.  Cough Syrup Recipe: Sweet Lemon & Honey Thyme  Ingredients . a handful of fresh thyme sprigs   . 1 pint of water (2 cups)  . 1/2 cup honey (raw is best, but regular will do)  . 1/2 lemon chopped Instructions 1. Place the lemon in the pint jar and cover with the honey. The honey will macerate the lemons and draw out liquids which taste so delicious! 2. Meanwhile, toss the thyme leaves into a saucepan and cover them with the water. 3. Bring the water to a gentle simmer and reduce it to half, about a cup of tea. 4. When the tea is reduced and cooled a bit, strain the sprigs & leaves, add it into the pint jar and stir it well. 5. Give it a shake and use a spoonful as needed. 6. Store your homemade cough syrup in the refrigerator for about a month.  What  causes a cough?  In adults, common causes of a cough include: ?An infection of the airways or lungs (such as the common cold) ?Postnasal drip - Postnasal drip is when mucus from the nose drips down or flows along the back of the throat. Postnasal drip can happen when people have: .A cold .Allergies .A sinus infection - The sinuses are hollow areas in the bones of the face that open into the nose. ?Lung conditions, like asthma and chronic obstructive pulmonary disease (COPD) - Both of these conditions can make it hard to breathe. COPD is usually caused by smoking. ?Acid reflux - Acid reflux is when the acid that is normally in your stomach backs up into your esophagus (the tube that carries food from your mouth to your stomach). ?A side effect from blood pressure medicines called "ACE inhibitors" ?Smoking cigarettes  Is there anything I can do on my own to get rid of my cough?  Yes. To help get rid of your cough, you can: ?Use a humidifier in your bedroom ?Use an over-the-counter cough medicine, or suck on cough drops or hard candy ?Stop smoking, if you smoke ?If you have allergies, avoid the things you are allergic to (like pollen, dust, animals, or mold) If you have acid reflux, your doctor or nurse will tell you which lifestyle changes can help  reduce symptoms.   -----------------------------------------------------------------------------------------------------------------------------------------  (The American heart Association is an excellent resource for lowering cholesterol. Please check out their website) Lowering your cholesterol levels will lower your risk of a cardiac event.   Populations with high intakes of omega-3 (n-3) polyunsaturated fatty acids (such as the [place]) have low rates of heart disease I recommend a fish oil/omega-3 supplement daily as well as focusing on low cholesterol diet and exercise. It is important to take an omega-3/fish oil supplement that is highest in  the [place] and EPA types of omega-3 so look for those under the active ingredients and pick the one with the most during the next time you purchase supplements. Sometimes the the store brands will actually end up being the best for the least cost. Do not pay attention to the total mg listed on the front of the bottle.    Start taking flaxseed oil daily or red yeast rice. Red yeast rice is a fermented rice product that has been used in [place] cuisine and medicinally to promote "blood circulation". Red yeast rice: (2.4 g/day)  Daily consumption of almonds (42 g/day or about half a cup or 1.5 ounces), walnuts, pistachios alone or in combination with dark chocolate had favorable effects on total cholesterol  Fiber-rich foods, such as fruits, vegetables, beans, and oats, seem to lower cholesterol and are generally good for your health.    A Mediterranean diet appears to reduce the risk of cardiovascular events. There is no single Mediterranean diet, but such diets are typically high in fruits, vegetables, whole grains, beans, nuts, and seeds and include olive oil as an important source of fat; there are typically low to moderate amounts of fish, poultry, and dairy products, and there is little red meat.   Consider a probiotic.  Start taking a mens 50+ vitamin. Try Centrum (or ask your pharmacist).

## 2017-11-09 ENCOUNTER — Telehealth: Payer: Self-pay | Admitting: Physician Assistant

## 2017-11-09 NOTE — Telephone Encounter (Signed)
Copied from [place] [phone]. Topic: Quick Communication - See Telephone Encounter >> Nov 09, 2017 12:01 PM Gardiner Ramus wrote: CRM for notification. See Telephone encounter for: 11/09/17. Pt called and stated that he is taking colmipnenere for hot flashes and taking magestral every other day. Pt was calling to update chart.

## 2017-11-10 ENCOUNTER — Ambulatory Visit (HOSPITAL_BASED_OUTPATIENT_CLINIC_OR_DEPARTMENT_OTHER): Payer: Medicare Other | Attending: Interventional Cardiology | Admitting: Cardiology

## 2017-11-10 VITALS — Ht 63.0 in | Wt 166.0 lb

## 2017-11-10 DIAGNOSIS — R0683 Snoring: Secondary | ICD-10-CM | POA: Diagnosis not present

## 2017-11-10 DIAGNOSIS — G4710 Hypersomnia, unspecified: Secondary | ICD-10-CM | POA: Insufficient documentation

## 2017-11-10 DIAGNOSIS — R400 Somnolence: Secondary | ICD-10-CM | POA: Diagnosis not present

## 2017-11-10 NOTE — Telephone Encounter (Signed)
Noted  

## 2017-11-11 NOTE — Procedures (Signed)
   Patient Name: Robert Estes, Robert Estes  Study Date:01/13/2017 11/10/2017   Gender: Male  D.O.B: 1945-02-24  Age (years): 16  Referring Provider: Daneen Schick  Height (inches): 63  Interpreting Physician: Fransico Him MD, ABSM  Weight (lbs): 166  RPSGT: Zadie Rhine  BMI: 29  MRN: [id number]  Neck Size: 16.00   CLINICAL INFORMATION  Sleep Study Type: NPSG Indication for sleep study: Excessive Daytime Sleepiness Epworth Sleepiness Score: 3  SLEEP STUDY TECHNIQUE  As per the AASM Manual for the Scoring of Sleep and Associated Events v2.3 (April 2016) with a hypopnea requiring 4% desaturations. The channels recorded and monitored were frontal, central and occipital EEG, electrooculogram (EOG), submentalis EMG (chin), nasal and oral airflow, thoracic and abdominal wall motion, anterior tibialis EMG, snore microphone, electrocardiogram, and pulse oximetry.  MEDICATIONS  Medications self-administered by patient taken the night of the study : [place]  The study was initiated at 10:12:04 PM and ended at 4:25:31 AM. Sleep onset time was 11.7 minutes and the sleep efficiency was 65.9%%. The total sleep time was 246.2 minutes. Stage REM latency was 62.0 minutes. The patient spent 6.1%% of the night in stage N1 sleep, 66.5%% in stage N2 sleep, 11.6%% in stage N3 and 15.8% in REM. Alpha intrusion was absent. Supine sleep was 6.15%.  RESPIRATORY PARAMETERS  The overall apnea/hypopnea index (AHI) was 0.7 per hour. There were 1 total apneas, including 0 obstructive, 1 central and 0 mixed apneas. There were 2 hypopneas and 28 RERAs. The AHI during Stage REM sleep was 1.5 per hour. AHI while supine was 0.0 per hour. The mean oxygen saturation was 96.1%. The minimum SpO2 during sleep was 88.0%. snoring was noted during this study.  CARDIAC DATA  The 2 lead EKG demonstrated sinus rhythm. The mean heart rate was 68.9 beats per minute. Other EKG findings include: PVCs.   LEG MOVEMENT  DATA  The total PLMS were 0 with a resulting PLMS index of 0.0. Associated arousal with leg movement index was 1.5 .  IMPRESSIONS  No significant obstructive sleep apnea occurred during this study (AHI = 0.7/h). No significant central sleep apnea occurred during this study (CAI = 0.2/h).  The patient had minimal or no oxygen desaturation during the study (Min O2 = 88.0%)  No snoring was audible during this study.  EKG findings include PVCs.  Clinically significant periodic limb movements did not occur during sleep. No significant associated arousals.  DIAGNOSIS  Normal Study  RECOMMENDATIONS  Avoid alcohol, sedatives and other CNS depressants that may worsen sleep apnea and disrupt normal sleep architecture.  Sleep hygiene should be reviewed to assess factors that may improve sleep quality.  Weight management and regular exercise should be initiated or continued if appropriate.  [Electronically signed] 11/11/2017 09:25 PM Fransico Him MD, ABSM  Diplomate, American Board of Sleep Medicine

## 2017-11-12 ENCOUNTER — Telehealth: Payer: Self-pay | Admitting: *Deleted

## 2017-11-12 NOTE — Telephone Encounter (Signed)
-----   Message from Sueanne Margarita, MD sent at 11/11/2017  9:29 PM EDT ----- Please let patient know that sleep study showed no significant sleep apnea.

## 2017-11-12 NOTE — Telephone Encounter (Signed)
Patient notified of sleep study results. He stated that " I never went to sleep. They said I did, but I didn't. Patient went on to discuss how he cannot go to sleep and stay asleep, therefore causing him to be tired the next day. I told him that I will mention this to the physician. If she wants to do any further testing he will be notified. Patient thanked me for calling. Message will be routed to Dr Radford Pax for review.

## 2017-11-15 NOTE — Telephone Encounter (Signed)
He needs to talk with his PCP about insomnia

## 2017-11-17 NOTE — Telephone Encounter (Signed)
Reached out to the patient and gave him the recommendation  made by Dr. Radford Pax to talk with his PCP about his insomnia and  understanding was verbalized. Pt is aware and agreeable to these results.

## 2017-11-18 DIAGNOSIS — H401131 Primary open-angle glaucoma, bilateral, mild stage: Secondary | ICD-10-CM | POA: Diagnosis not present

## 2017-11-30 ENCOUNTER — Other Ambulatory Visit: Payer: Self-pay | Admitting: Interventional Cardiology

## 2017-11-30 DIAGNOSIS — E785 Hyperlipidemia, unspecified: Secondary | ICD-10-CM

## 2017-12-01 DIAGNOSIS — D497 Neoplasm of unspecified behavior of endocrine glands and other parts of nervous system: Secondary | ICD-10-CM | POA: Diagnosis not present

## 2017-12-01 DIAGNOSIS — J343 Hypertrophy of nasal turbinates: Secondary | ICD-10-CM | POA: Diagnosis not present

## 2017-12-01 DIAGNOSIS — J3489 Other specified disorders of nose and nasal sinuses: Secondary | ICD-10-CM | POA: Diagnosis not present

## 2017-12-01 DIAGNOSIS — R0981 Nasal congestion: Secondary | ICD-10-CM | POA: Diagnosis not present

## 2017-12-01 DIAGNOSIS — J342 Deviated nasal septum: Secondary | ICD-10-CM | POA: Diagnosis not present

## 2017-12-01 DIAGNOSIS — H539 Unspecified visual disturbance: Secondary | ICD-10-CM | POA: Diagnosis not present

## 2017-12-09 DIAGNOSIS — D352 Benign neoplasm of pituitary gland: Secondary | ICD-10-CM | POA: Diagnosis not present

## 2017-12-10 ENCOUNTER — Telehealth: Payer: Self-pay | Admitting: Physician Assistant

## 2017-12-10 DIAGNOSIS — G4700 Insomnia, unspecified: Secondary | ICD-10-CM

## 2017-12-10 NOTE — Telephone Encounter (Signed)
Requested medication (s) are due for refill today: yes  Requested medication (s) are on the active medication list: yes    Last refill: 09/16/17  #30  1 refill  Future visit scheduled no  Notes to clinic:not delegated  Requested Prescriptions  Pending Prescriptions Disp Refills   zolpidem (AMBIEN) 10 MG tablet [Pharmacy Med Name: ZOLPIDEM 10MG  TABLETS] 30 tablet 0    Sig: TAKE 1 TABLET(10 MG) BY MOUTH AT BEDTIME AS NEEDED FOR SLEEP     Not Delegated - Psychiatry:  Anxiolytics/Hypnotics Failed - 12/10/2017  2:33 PM      Failed - This refill cannot be delegated      Failed - Urine Drug Screen completed in last 360 days.      Passed - Valid encounter within last 6 months    Recent Outpatient Visits          1 month ago Lower respiratory infection   Primary Care at [facility], Gelene Mink, PA-C   2 months ago Insomnia, unspecified type   Primary Care at [facility], Gelene Mink, PA-C   3 months ago Fatigue, unspecified type   Primary Care at [place], [place]   7 months ago Testosterone deficiency in male   Primary Care at [facility], Gelene Mink, PA-C   1 year ago Acute bronchitis, unspecified organism   Primary Care at [facility], [place], [place]

## 2017-12-17 NOTE — Telephone Encounter (Signed)
Pt's wife Lelon Frohlich called to check on the status of this refill zolpidem (AMBIEN) 10 MG tablet. Original request sent from pharmacy on 12/10/17.  Also pt ws given paper Rx for Azithromycin from [facility] but it was misplaced. She would like to know if this rx can be called in as patient now feels he needs it. Please advise  CB# [phone]  [facility], [place] AT [address] [phone] (Phone) [phone] (Fax)

## 2017-12-17 NOTE — Telephone Encounter (Signed)
Requested medication (s) are due for refill today: yes  Requested medication (s) are on the active medication list: yes    Last refill: 09/16/17  #30  1 refill  Future visit scheduled no  Notes to clinic:not delegated  Also pt ws given paper Rx for Azithromycin from [facility] but it was misplaced. She would like to know if this rx can be called in as patient now feels he needs it. Please advise  CB# [phone]  [id number] - [place], [address] AT [address] [phone] (Phone) [phone] (        Requested Prescriptions  Pending Prescriptions Disp Refills   zolpidem (AMBIEN) 10 MG tablet [Pharmacy Med Name: ZOLPIDEM 10MG  TABLETS] 30 tablet 0    Sig: TAKE 1 TABLET(10 MG) BY MOUTH AT BEDTIME AS NEEDED FOR SLEEP     Not Delegated - Psychiatry:  Anxiolytics/Hypnotics Failed - 12/17/2017 11:51 AM      Failed - This refill cannot be delegated      Failed - Urine Drug Screen completed in last 360 days.      Passed - Valid encounter within last 6 months    Recent Outpatient Visits          1 month ago Lower respiratory infection   Primary Care at [facility], [place], PA-C   3 months ago Insomnia, unspecified type   Primary Care at [facility], Gelene Mink, PA-C   3 months ago Fatigue, unspecified type   Primary Care at [place], [place]   7 months ago Testosterone deficiency in male   Primary Care at [facility], Gelene Mink, PA-C   1 year ago Acute bronchitis, unspecified organism   Primary Care at [facility], [place], [place]

## 2017-12-18 ENCOUNTER — Other Ambulatory Visit: Payer: Self-pay | Admitting: Physician Assistant

## 2017-12-18 DIAGNOSIS — G4700 Insomnia, unspecified: Secondary | ICD-10-CM

## 2017-12-18 MED ORDER — ZOLPIDEM TARTRATE 10 MG PO TABS: 10.0000 mg | ORAL_TABLET | Freq: Every evening | ORAL | 1 refills | Status: DC | PRN

## 2017-12-25 ENCOUNTER — Telehealth: Payer: Self-pay | Admitting: Physician Assistant

## 2017-12-25 NOTE — Telephone Encounter (Signed)
Copied from [place] [id number]. Topic: General - Other >> Dec 23, 2017  5:21 PM Alanda Slim E wrote: Reason for CRM: Pt called in and stated he lost the print prescription for what I believe is cyclobenzaprine (FLEXERIL) 5 MG tablet  and wants to know if he can get it rewritten. Pt stated he didn't know the name and never was able to fill Rx due to losing paper.

## 2017-12-26 NOTE — Telephone Encounter (Signed)
Last Rx for Flexeril was written in 2018 by a provider outside the practice.

## 2017-12-30 NOTE — Telephone Encounter (Signed)
Doesn't look like I ever Rx this medication for this pt.

## 2017-12-31 ENCOUNTER — Ambulatory Visit: Payer: Self-pay

## 2017-12-31 NOTE — Telephone Encounter (Signed)
We have never prescribed this medication.   Lm to call back

## 2017-12-31 NOTE — Progress Notes (Signed)
Chief Complaint  Patient presents with  . Nasal Congestion    x 2 months, seen by [facility] on 11/05/17 for cough.  Taking dextromethorphan Hbr extended release tab and helps but when he stops the symptoms come back.  Per pt he has taken four packs of 14 of this medicine and not better.  . Sore Throat    HPI  Patient reports that he misplaced his script for abx He has been taking dextromorphan He reports that he has a big ball of phlegm in his throat and has been sneezing and coughing and feels extremely tired He reports that he has a "wad of mucus in his chest I can't cough up" His surgery is 02/01/2018   Past Medical History:  Diagnosis Date  . Arthritis    "hands, knees, elbows, fingers" (01/25/2013)  . Coronary artery disease    HX OF HEART STENTS X 2   2010--DR. H. SMITH IS PT'S CARDIOLOGIST, MILD AORTIC STENOSIS  . Dizziness   . Enlarged prostate    FLOMAX HELPS PT VOID  . Exertional shortness of breath   . GERD (gastroesophageal reflux disease)    OTC PREVACID AS NEEDED  . Glaucoma    BOTH EYES  . Gout   . Heart murmur   . Hyperlipidemia   . Hypertension   . Hypertension   . Iron deficiency anemia    hx  . Migraine    "monthly" (01/25/2013)  . Peripheral vascular disease ([place])    CLAUDICATION    Current Outpatient Medications  Medication Sig Dispense Refill  . acetaminophen (TYLENOL) 500 MG tablet Take 1,500 mg by mouth every 6 (six) hours as needed for moderate pain.    Marland Kitchen aspirin 81 MG tablet Take 81 mg by mouth daily.    . benzonatate (TESSALON) 100 MG capsule Take 1-2 capsules (100-200 mg total) by mouth 3 (three) times daily as needed for cough. 40 capsule 0  . cyclobenzaprine (FLEXERIL) 5 MG tablet Take 1 tablet (5 mg total) at bedtime as needed by mouth for muscle spasms. 12 tablet 0  . metoprolol succinate (TOPROL-XL) 25 MG 24 hr tablet Take 1 tablet (25 mg total) by mouth daily. 90 tablet 3  . nitroGLYCERIN (NITROSTAT) 0.4 MG SL tablet Place 1 tablet (0.4  mg total) under the tongue every 5 (five) minutes as needed for chest pain. 25 tablet 5  . promethazine-phenylephrine (PROMETHAZINE-PHENYLEPHRINE) 6.25-5 MG/5ML SYRP Take 5 mLs by mouth every 4 (four) hours as needed for congestion. 280 mL 0  . rosuvastatin (CRESTOR) 5 MG tablet TAKE 1 TABLET BY MOUTH 5 TIMES A WEEK 25 tablet 10  . tamsulosin (FLOMAX) 0.4 MG CAPS capsule Take 0.8 mg by mouth at bedtime.   0  . travoprost, benzalkonium, (TRAVATAN) 0.004 % ophthalmic solution Place 1 drop into both eyes at bedtime.     Marland Kitchen zolpidem (AMBIEN) 10 MG tablet Take 1 tablet (10 mg total) by mouth at bedtime as needed for sleep. 30 tablet 1  . azithromycin (ZITHROMAX) 250 MG tablet TAKE 2 TABS ON DAY, ONE TABLET DAY AFTER 6 tablet 0   No current facility-administered medications for this visit.     Allergies:  Allergies  Allergen Reactions  . Codeine Itching    Past Surgical History:  Procedure Laterality Date  . CHOLECYSTECTOMY N/A 01/25/2013   Procedure: LAPAROSCOPIC CHOLECYSTECTOMY WITH INTRAOPERATIVE CHOLANGIOGRAM;  Surgeon: [name], MD;  Location: [place];  Service: General;  Laterality: N/A;  . CORONARY ANGIOPLASTY WITH STENT PLACEMENT  11/27/2008   "2" (01/25/2013)  . EUS  12/24/2011   Procedure: FULL UPPER ENDOSCOPIC ULTRASOUND (EUS) RADIAL;  Surgeon: [name], MD;  Location: [facility];  Service: Endoscopy;  Laterality: N/A;  . INCISION AND DRAINAGE     thrombosed hemorrhoid  . LAPAROSCOPIC CHOLECYSTECTOMY  01/25/2013    Social History   Socioeconomic History  . Marital status: Married    Spouse name: Not on file  . Number of children: Not on file  . Years of education: Not on file  . Highest education level: Not on file  Occupational History  . Not on file  Social Needs  . Financial resource strain: Not on file  . Food insecurity:    Worry: Not on file    Inability: Not on file  . Transportation needs:    Medical: Not on file    Non-medical: Not on file    Tobacco Use  . Smoking status: Never Smoker  . Smokeless tobacco: Never Used  Substance and Sexual Activity  . Alcohol use: No  . Drug use: No  . Sexual activity: Never  Lifestyle  . Physical activity:    Days per week: Not on file    Minutes per session: Not on file  . Stress: Not on file  Relationships  . Social connections:    Talks on phone: Not on file    Gets together: Not on file    Attends religious service: Not on file    Active member of club or organization: Not on file    Attends meetings of clubs or organizations: Not on file    Relationship status: Not on file  Other Topics Concern  . Not on file  Social History Narrative  . Not on file    Family History  Problem Relation Age of Onset  . Breast cancer Mother   . Cancer Mother   . Heart failure Mother   . Heart disease Father   . Heart attack Father      ROS Review of Systems See HPI Constitution: No fevers or chills No malaise No diaphoresis Skin: No rash or itching Eyes: no blurry vision, no double vision GU: no dysuria or hematuria Neuro: no dizziness or headaches  all others reviewed and negative   Objective: Vitals:   01/01/18 1713  BP: 131/76  Pulse: 97  Resp: 16  Temp: 98.1 F (36.7 C)  TempSrc: Oral  SpO2: 97%  Weight: 177 lb 9.6 oz (80.6 kg)  Height: 5\' 3"  (1.6 m)    Physical Exam General: alert, oriented, in NAD Head: normocephalic, atraumatic, no sinus tenderness Eyes: EOM intact, no scleral icterus or conjunctival injection Ears: TM clear bilaterally Nose: mucosa nonerythematous, nonedematous Throat: no pharyngeal exudate or erythema Lymph: no posterior auricular, submental or cervical lymph adenopathy Heart: normal rate, normal sinus rhythm, no murmurs Lungs: clear to auscultation bilaterally, no wheezing   Assessment and Plan Garan was seen today for nasal congestion and sore throat.  Diagnoses and all orders for this visit:  Acute URI  Other orders -      azithromycin (ZITHROMAX) 250 MG tablet; TAKE 2 TABS ON DAY, ONE TABLET DAY AFTER   Discussed viral etiology Since the patient has neuroendoscopic surgery on 02/01/2017 will treat with zpak since they will be going through the nose. Will treat to prevent sinusitis. Discussed otc decongestant Advised flonase for congestion  Increase hydration Vitamin C and zinc lozenges suggested Return to clinic if symptoms worse   [place]

## 2017-12-31 NOTE — Telephone Encounter (Signed)
Patient's wife called and asked if the patient should continue taking OTC Dextromethorphan ER. She says he is out and she will go buy another pack, if he needs it. I advised to hold off on buying more until he is seen tomorrow by Dr. Nolon Rod. I advised to bring the box to show to the provider, she verbalized understanding.

## 2017-12-31 NOTE — Telephone Encounter (Signed)
See triage encounter.

## 2017-12-31 NOTE — Telephone Encounter (Signed)
Pt's wife following up to see if azithromycin could be called in or if a new Rx could be written. She stated it was given as a paper rx from [place] on 11/05/17 but it was lost. Listed on After Visit Summary. Please advise.  [facility] [address], [place] [address] [phone] (Phone) [phone] (Fax)

## 2017-12-31 NOTE — Telephone Encounter (Signed)
Patient's wife called in to receive an antibiotic that was never filled due to the paper Rx was misplaced. I advised that he will need to be seen in the office before that can be prescribed due to he was seen on 11/05/17 for the sinus congestion, she verbalized understanding. She says "he has no pain, no fever, he is just still congested." He came in, so she put him on the phone to finish the triage. I asked about the drainage, he says "it's clear right now, but is not clearing up. I can buy the pills OTC and take them, then it will clear up. As soon as I stop taking them, the congestion is back. I am going to have surgery soon and I can't have any infection. That's why I was trying to get the prescription." I asked about other symptoms, he says "sore throat. At night when I lay down, it's hard to breathe out my nose." According to protocol, see PCP within 3 days, appointment scheduled for tomorrow at 1720 with Dr. Nolon Rod, care advice given, patient's wife verbalized understanding.   Reason for Disposition . [1] Sinus congestion (pressure, fullness) AND [2] present > 10 days  Answer Assessment - Initial Assessment Questions 1. LOCATION: "Where does it hurt?"      No 2. ONSET: "When did the sinus pain start?"  (e.g., hours, days)      Never ended since he was there in October 3. SEVERITY: "How bad is the pain?"   (Scale 1-10; mild, moderate or severe)   - MILD (1-3): doesn't interfere with normal activities    - MODERATE (4-7): interferes with normal activities (e.g., work or school) or awakens from sleep   - SEVERE (8-10): excruciating pain and patient unable to do any normal activities        No 4. RECURRENT SYMPTOM: "Have you ever had sinus problems before?" If so, ask: "When was the last time?" and "What happened that time?"      Yes 5. NASAL CONGESTION: "Is the nose blocked?" If so, ask, "Can you open it or must you breathe through the mouth?"     No 6. NASAL DISCHARGE: "Do you have  discharge from your nose?" If so ask, "What color?"     Clear 7. FEVER: "Do you have a fever?" If so, ask: "What is it, how was it measured, and when did it start?"      No 8. OTHER SYMPTOMS: "Do you have any other symptoms?" (e.g., sore throat, cough, earache, difficulty breathing)     Sore throat 9. PREGNANCY: "Is there any chance you are pregnant?" "When was your last menstrual period?"     N/A  Protocols used: SINUS PAIN OR CONGESTION-A-AH

## 2017-12-31 NOTE — Telephone Encounter (Signed)
Please advise we have never prescribed this medication .Marland KitchenMarland Kitchen

## 2018-01-01 ENCOUNTER — Other Ambulatory Visit: Payer: Self-pay

## 2018-01-01 ENCOUNTER — Encounter: Payer: Self-pay | Admitting: Family Medicine

## 2018-01-01 ENCOUNTER — Ambulatory Visit: Payer: Medicare Other | Admitting: Family Medicine

## 2018-01-01 VITALS — BP 131/76 | HR 97 | Temp 98.1°F | Resp 16 | Ht 63.0 in | Wt 177.6 lb

## 2018-01-01 DIAGNOSIS — J069 Acute upper respiratory infection, unspecified: Secondary | ICD-10-CM

## 2018-01-01 MED ORDER — AZITHROMYCIN 250 MG PO TABS: ORAL_TABLET | ORAL | 0 refills | Status: DC

## 2018-01-01 NOTE — Patient Instructions (Addendum)
   If you have lab work done today you will be contacted with your lab results within the next 2 weeks.  If you have not heard from us then please contact us. The fastest way to get your results is to register for My Chart.   IF you received an x-ray today, you will receive an invoice from [facility]. Please contact [facility] at [phone] with questions or concerns regarding your invoice.   IF you received labwork today, you will receive an invoice from [facility]. Please contact [facility] at [phone] with questions or concerns regarding your invoice.   Our billing staff will not be able to assist you with questions regarding bills from these companies.  You will be contacted with the lab results as soon as they are available. The fastest way to get your results is to activate your My Chart account. Instructions are located on the last page of this paperwork. If you have not heard from us regarding the results in 2 weeks, please contact this office.     Viral Respiratory Infection A respiratory infection is an illness that affects part of the respiratory system, such as the lungs, nose, or throat. Most respiratory infections are caused by either viruses or bacteria. A respiratory infection that is caused by a virus is called a viral respiratory infection. Common types of viral respiratory infections include:  A cold.  The flu (influenza).  A respiratory syncytial virus (RSV) infection.  How do I know if I have a viral respiratory infection? Most viral respiratory infections cause:  A stuffy or runny nose.  Yellow or green nasal discharge.  A cough.  Sneezing.  Fatigue.  Achy muscles.  A sore throat.  Sweating or chills.  A fever.  A headache.  How are viral respiratory infections treated? If influenza is diagnosed early, it may be treated with an antiviral medicine that shortens the length of time a person has symptoms. Symptoms of viral  respiratory infections may be treated with over-the-counter and prescription medicines, such as:  Expectorants. These make it easier to cough up mucus.  Decongestant nasal sprays.  Health care providers do not prescribe antibiotic medicines for viral infections. This is because antibiotics are designed to kill bacteria. They have no effect on viruses. How do I know if I should stay home from work or school? To avoid exposing others to your respiratory infection, stay home if you have:  A fever.  A persistent cough.  A sore throat.  A runny nose.  Sneezing.  Muscles aches.  Headaches.  Fatigue.  Weakness.  Chills.  Sweating.  Nausea.  Follow these instructions at home:  Rest as much as possible.  Take over-the-counter and prescription medicines only as told by your health care provider.  Drink enough fluid to keep your urine clear or pale yellow. This helps prevent dehydration and helps loosen up mucus.  Gargle with a salt-water mixture 3-4 times per day or as needed. To make a salt-water mixture, completely dissolve -1 tsp of salt in 1 cup of warm water.  Use nose drops made from salt water to ease congestion and soften raw skin around your nose.  Do not drink alcohol.  Do not use tobacco products, including cigarettes, chewing tobacco, and e-cigarettes. If you need help quitting, ask your health care provider. Contact a health care provider if:  Your symptoms last for 10 days or longer.  Your symptoms get worse over time.  You have a fever.  You   have severe sinus pain in your face or forehead.  The glands in your jaw or neck become very swollen. Get help right away if:  You feel pain or pressure in your chest.  You have shortness of breath.  You faint or feel like you will faint.  You have severe and persistent vomiting.  You feel confused or disoriented. This information is not intended to replace advice given to you by your health care  provider. Make sure you discuss any questions you have with your health care provider. Document Released: 10/23/2004 Document Revised: 06/21/2015 Document Reviewed: 06/21/2014 Elsevier Interactive Patient Education  2018 Elsevier Inc.  

## 2018-01-11 ENCOUNTER — Other Ambulatory Visit: Payer: Self-pay

## 2018-01-11 ENCOUNTER — Encounter (HOSPITAL_COMMUNITY): Payer: Self-pay | Admitting: *Deleted

## 2018-01-11 ENCOUNTER — Ambulatory Visit (HOSPITAL_COMMUNITY)
Admission: EM | Admit: 2018-01-11 | Discharge: 2018-01-11 | Disposition: A | Discharge status: Home / Self Care | Payer: Medicare Other | Attending: Family Medicine | Admitting: Family Medicine

## 2018-01-11 DIAGNOSIS — J320 Chronic maxillary sinusitis: Secondary | ICD-10-CM

## 2018-01-11 MED ORDER — AMOXICILLIN-POT CLAVULANATE 875-125 MG PO TABS: 1.0000 | ORAL_TABLET | Freq: Two times a day (BID) | ORAL | 0 refills | Status: DC

## 2018-01-11 MED ORDER — FLUTICASONE PROPIONATE 50 MCG/ACT NA SUSP: 2.0000 | Freq: Every day | NASAL | 2 refills | Status: DC

## 2018-01-11 NOTE — Discharge Instructions (Signed)
Drink lots of fluids Take the antibiotic 2 x a day This is a strong antibiotic - if it causes stomach upset or diarrhea take a probiotic with it Use the flonase every day until you feel better This helps with sinus drainage Good luck with your surgery!

## 2018-01-11 NOTE — ED Triage Notes (Signed)
C/o sorethroat , was seen x 2 at urgent care, states he used OTC med without relief states he used z-pak and after finishing z-pak sx. Returned.

## 2018-01-11 NOTE — ED Provider Notes (Signed)
[name]    CSN: [id number] Arrival date & time: 01/11/18  1946     History   Chief Complaint Chief Complaint  Patient presents with  . Sore Throat    HPI Robert Estes is a 72 y.o. male.   HPI Patient is here for sinus drainage, postnasal drip, sore throat and mild cough.  He states he is also having some nausea.  States that he was seen by his PCP on 12 6.  Treated.  He states he felt better with the first dose of the Z-Pak within the symptoms started to come back.  He is very concerned because he has surgery scheduled for a pituitary macroadenoma in January.  He is wanting to hold back of surgery.  He states that he is having some yellow drainage.  Sinus congestion.  Face pain.  Nausea.  Bad taste.  No fever.  No coughing or chest congestion.  Past Medical History:  Diagnosis Date  . Arthritis    "hands, knees, elbows, fingers" (01/25/2013)  . Coronary artery disease    HX OF HEART STENTS X 2   2010--DR. H. SMITH IS PT'S CARDIOLOGIST, MILD AORTIC STENOSIS  . Dizziness   . Enlarged prostate    FLOMAX HELPS PT VOID  . Exertional shortness of breath   . GERD (gastroesophageal reflux disease)    OTC PREVACID AS NEEDED  . Glaucoma    BOTH EYES  . Gout   . Heart murmur   . Hyperlipidemia   . Hypertension   . Hypertension   . Iron deficiency anemia    hx  . Migraine    "monthly" (01/25/2013)  . Peripheral vascular disease ([place])    CLAUDICATION    Patient Active Problem List   Diagnosis Date Noted  . Aortic stenosis 03/10/2014  . Left carotid bruit 03/10/2014  . Glaucoma   . Hypertension   . Enlarged prostate   . Coronary artery disease   . Peripheral vascular disease ([place])   . Dizziness   . Hyperlipidemia   . GERD (gastroesophageal reflux disease)   . Gout   . Exertional shortness of breath   . Iron deficiency anemia   . Migraine   . Arthritis   . S/P laparoscopic cholecystectomy 01/25/2013  . Thrombosed external hemorrhoid 03/25/2012     Past Surgical History:  Procedure Laterality Date  . CHOLECYSTECTOMY N/A 01/25/2013   Procedure: LAPAROSCOPIC CHOLECYSTECTOMY WITH INTRAOPERATIVE CHOLANGIOGRAM;  Surgeon: [name], MD;  Location: [place];  Service: General;  Laterality: N/A;  . CORONARY ANGIOPLASTY WITH STENT PLACEMENT  11/27/2008   "2" (01/25/2013)  . EUS  12/24/2011   Procedure: FULL UPPER ENDOSCOPIC ULTRASOUND (EUS) RADIAL;  Surgeon: [name], MD;  Location: [facility];  Service: Endoscopy;  Laterality: N/A;  . INCISION AND DRAINAGE     thrombosed hemorrhoid  . LAPAROSCOPIC CHOLECYSTECTOMY  01/25/2013       Home Medications    Prior to Admission medications   Medication Sig Start Date End Date Taking? Authorizing Provider  acetaminophen (TYLENOL) 500 MG tablet Take 1,500 mg by mouth every 6 (six) hours as needed for moderate pain.    [provider]  amoxicillin-clavulanate (AUGMENTIN) 875-125 MG tablet Take 1 tablet by mouth every 12 (twelve) hours. 01/11/18   Raylene Everts, MD  aspirin 81 MG tablet Take 81 mg by mouth daily.    [provider]  fluticasone (FLONASE) 50 MCG/ACT nasal spray Place 2 sprays into both nostrils daily. 01/11/18  Raylene Everts, MD  metoprolol succinate (TOPROL-XL) 25 MG 24 hr tablet Take 1 tablet (25 mg total) by mouth daily. 10/08/17   Belva Crome, MD  nitroGLYCERIN (NITROSTAT) 0.4 MG SL tablet Place 1 tablet (0.4 mg total) under the tongue every 5 (five) minutes as needed for chest pain. 10/27/17 01/25/18  Belva Crome, MD  rosuvastatin (CRESTOR) 5 MG tablet TAKE 1 TABLET BY MOUTH 5 TIMES A WEEK 10/28/17   Belva Crome, MD  tamsulosin (FLOMAX) 0.4 MG CAPS capsule Take 0.8 mg by mouth at bedtime.  02/24/14   [provider]  travoprost, benzalkonium, (TRAVATAN) 0.004 % ophthalmic solution Place 1 drop into both eyes at bedtime.     [provider]  zolpidem (AMBIEN) 10 MG tablet Take 1 tablet (10 mg total) by mouth at  bedtime as needed for sleep. 12/18/17   McVey, Gelene Mink, PA-C    Family History Family History  Problem Relation Age of Onset  . Breast cancer Mother   . Cancer Mother   . Heart failure Mother   . Heart disease Father   . Heart attack Father     Social History Social History   Tobacco Use  . Smoking status: Never Smoker  . Smokeless tobacco: Never Used  Substance Use Topics  . Alcohol use: No  . Drug use: No     Allergies   Codeine   Review of Systems Review of Systems  Constitutional: Negative for chills and fever.  HENT: Positive for congestion, postnasal drip, rhinorrhea, sinus pressure and sore throat. Negative for ear pain.   Eyes: Negative for pain and visual disturbance.  Respiratory: Positive for cough. Negative for shortness of breath.   Cardiovascular: Negative for chest pain and palpitations.  Gastrointestinal: Positive for nausea. Negative for abdominal pain and vomiting.  Genitourinary: Negative for dysuria and hematuria.  Musculoskeletal: Negative for arthralgias and back pain.  Skin: Negative for color change and rash.  Neurological: Negative for seizures and syncope.  All other systems reviewed and are negative.    Physical Exam Triage Vital Signs ED Triage Vitals  Enc Vitals Group     BP 01/11/18 2026 (!) 141/81     Pulse Rate 01/11/18 2026 63     Resp 01/11/18 2026 18     Temp 01/11/18 2026 98 F (36.7 C)     Temp Source 01/11/18 2026 Oral     SpO2 01/11/18 2026 98 %     Weight --      Height --      Head Circumference --      Peak Flow --      Pain Score 01/11/18 2028 3     Pain Loc --      Pain Edu? --      Excl. in [place]? --    No data found.  Updated Vital Signs BP (!) 141/81 (BP Location: Right Arm)   Pulse 63   Temp 98 F (36.7 C) (Oral)   Resp 18   SpO2 98%    Physical Exam Constitutional:      General: He is not in acute distress.    Appearance: He is well-developed and normal weight. He is not  ill-appearing.  HENT:     Head: Normocephalic and atraumatic.     Right Ear: Tympanic membrane and ear canal normal.     Left Ear: Tympanic membrane and ear canal normal.     Nose: Congestion and rhinorrhea present.  Comments: Sinuses tender maxillary.  Nasal membranes are swollen and red    Mouth/Throat:     Mouth: Mucous membranes are dry. No oral lesions.     Pharynx: No posterior oropharyngeal erythema.     Tonsils: No tonsillar exudate.  Eyes:     Conjunctiva/sclera: Conjunctivae normal.     Pupils: Pupils are equal, round, and reactive to light.  Neck:     Musculoskeletal: Normal range of motion.  Cardiovascular:     Rate and Rhythm: Normal rate.  Pulmonary:     Effort: Pulmonary effort is normal. No respiratory distress.  Abdominal:     General: There is no distension.     Palpations: Abdomen is soft.  Musculoskeletal: Normal range of motion.  Lymphadenopathy:     Cervical: No cervical adenopathy.  Skin:    General: Skin is warm and dry.  Neurological:     General: No focal deficit present.     Mental Status: He is alert and oriented to person, place, and time.  Psychiatric:        Behavior: Behavior normal.      UC Treatments / Results  Labs (all labs ordered are listed, but only abnormal results are displayed) Labs Reviewed - No data to display  EKG None  Radiology No results found.  Procedures Procedures (including critical care time)  Medications Ordered in UC Medications - No data to display  Initial Impression / Assessment and Plan / UC Course  I have reviewed the triage vital signs and the nursing notes.  Pertinent labs & imaging results that were available during my care of the patient were reviewed by me and considered in my medical decision making (see chart for details).    Patient has chronic sinus symptoms that may be more allergy than infectious.  He is concerned about his upcoming brain surgery.  He states he did feel better on  Zithromax.  Will try a week of Augmentin.  I am going to add Flonase to see if this helps with the drainage.  He is to push fluids and rest.  Follow-up with his PCP Final Clinical Impressions(s) / UC Diagnoses   Final diagnoses:  Chronic maxillary sinusitis     Discharge Instructions     Drink lots of fluids Take the antibiotic 2 x a day This is a strong antibiotic - if it causes stomach upset or diarrhea take a probiotic with it Use the flonase every day until you feel better This helps with sinus drainage Good luck with your surgery!    ED Prescriptions    Medication Sig Dispense Auth. Provider   amoxicillin-clavulanate (AUGMENTIN) 875-125 MG tablet Take 1 tablet by mouth every 12 (twelve) hours. 14 tablet Raylene Everts, MD   fluticasone [facility]) 50 MCG/ACT nasal spray Place 2 sprays into both nostrils daily. 16 g Raylene Everts, MD     Controlled Substance Prescriptions [place] Controlled Substance Registry consulted? Not Applicable   Raylene Everts, MD 01/11/18 2105

## 2018-01-18 DIAGNOSIS — D352 Benign neoplasm of pituitary gland: Secondary | ICD-10-CM | POA: Diagnosis not present

## 2018-01-18 DIAGNOSIS — R0989 Other specified symptoms and signs involving the circulatory and respiratory systems: Secondary | ICD-10-CM | POA: Diagnosis not present

## 2018-01-18 DIAGNOSIS — K219 Gastro-esophageal reflux disease without esophagitis: Secondary | ICD-10-CM | POA: Diagnosis not present

## 2018-01-18 DIAGNOSIS — I2510 Atherosclerotic heart disease of native coronary artery without angina pectoris: Secondary | ICD-10-CM | POA: Diagnosis not present

## 2018-01-18 DIAGNOSIS — I350 Nonrheumatic aortic (valve) stenosis: Secondary | ICD-10-CM | POA: Diagnosis not present

## 2018-01-18 DIAGNOSIS — G43019 Migraine without aura, intractable, without status migrainosus: Secondary | ICD-10-CM | POA: Diagnosis not present

## 2018-01-18 DIAGNOSIS — J343 Hypertrophy of nasal turbinates: Secondary | ICD-10-CM | POA: Diagnosis not present

## 2018-01-18 DIAGNOSIS — D509 Iron deficiency anemia, unspecified: Secondary | ICD-10-CM | POA: Diagnosis not present

## 2018-01-26 ENCOUNTER — Encounter (HOSPITAL_COMMUNITY): Payer: Self-pay

## 2018-01-26 ENCOUNTER — Ambulatory Visit (HOSPITAL_COMMUNITY)
Admission: EM | Admit: 2018-01-26 | Discharge: 2018-01-26 | Disposition: A | Discharge status: Home / Self Care | Payer: Medicare Other | Attending: Family Medicine | Admitting: Family Medicine

## 2018-01-26 DIAGNOSIS — R0981 Nasal congestion: Secondary | ICD-10-CM | POA: Insufficient documentation

## 2018-01-26 DIAGNOSIS — R059 Cough, unspecified: Secondary | ICD-10-CM

## 2018-01-26 DIAGNOSIS — R05 Cough: Secondary | ICD-10-CM | POA: Insufficient documentation

## 2018-01-26 MED ORDER — BENZONATATE 100 MG PO CAPS: 100.0000 mg | ORAL_CAPSULE | Freq: Three times a day (TID) | ORAL | 0 refills | Status: DC

## 2018-01-26 NOTE — ED Triage Notes (Signed)
Pt present coughing and nasal congestion.  Pt states he has tried several medication and no relief.

## 2018-01-26 NOTE — Discharge Instructions (Addendum)
You may try taking over the counter Claritin daily.

## 2018-01-27 NOTE — ED Provider Notes (Signed)
[name]   [id number] 01/26/18 Arrival Time: 7253  ASSESSMENT & PLAN:  1. Nasal congestion   2. Cough    Meds ordered this encounter  Medications  . benzonatate (TESSALON) 100 MG capsule    Sig: Take 1 capsule (100 mg total) by mouth every 8 (eight) hours.    Dispense:  21 capsule    Refill:  0   Discussed typical duration of symptoms with suspected viral illness. OTC symptom care as needed. Ensure adequate fluid intake and rest. May f/u with PCP or here as needed.  Reviewed expectations re: course of current medical issues. Questions answered. Outlined signs and symptoms indicating need for more acute intervention. Patient verbalized understanding. After Visit Summary given.   SUBJECTIVE: History from: patient.  Robert Estes is a 73 y.o. male who presents with complaint of nasal congestion, post-nasal drainage, and a persistent dry cough; without sore throat. Onset abrupt, a couple of days ago. Overall without fatigue and without body aches. SOB: none. Wheezing: none. Fever: no. Overall normal PO intake without n/v. Sick contacts: no. No specific or significant aggravating or alleviating factors reported. OTC treatment: various cold medications without much help.  Received flu shot this year: no.  Social History   Tobacco Use  Smoking Status Never Smoker  Smokeless Tobacco Never Used    ROS: As per HPI.  OBJECTIVE:  Vitals:   01/26/18 1752  BP: (!) 142/72  Pulse: 75  Resp: 16  Temp: 98.3 F (36.8 C)  TempSrc: Oral  SpO2: 99%    General appearance: alert; no distress HEENT: nasal congestion; clear runny nose; throat irritation secondary to post-nasal drainage Neck: supple without LAD CV: RRR Lungs: unlabored respirations, symmetrical air entry without wheezing; cough: mild Abd: soft Ext: no LE edema Skin: warm and dry Psychological: alert and cooperative; normal mood and affect  Allergies  Allergen Reactions  . Codeine Itching     Past Medical History:  Diagnosis Date  . Arthritis    "hands, knees, elbows, fingers" (01/25/2013)  . Coronary artery disease    HX OF HEART STENTS X 2   2010--DR. H. SMITH IS PT'S CARDIOLOGIST, MILD AORTIC STENOSIS  . Dizziness   . Enlarged prostate    FLOMAX HELPS PT VOID  . Exertional shortness of breath   . GERD (gastroesophageal reflux disease)    OTC PREVACID AS NEEDED  . Glaucoma    BOTH EYES  . Gout   . Heart murmur   . Hyperlipidemia   . Hypertension   . Hypertension   . Iron deficiency anemia    hx  . Migraine    "monthly" (01/25/2013)  . Peripheral vascular disease ([place])    CLAUDICATION   Family History  Problem Relation Age of Onset  . Breast cancer Mother   . Cancer Mother   . Heart failure Mother   . Heart disease Father   . Heart attack Father    Social History   Socioeconomic History  . Marital status: Married    Spouse name: Not on file  . Number of children: Not on file  . Years of education: Not on file  . Highest education level: Not on file  Occupational History  . Not on file  Social Needs  . Financial resource strain: Not on file  . Food insecurity:    Worry: Not on file    Inability: Not on file  . Transportation needs:    Medical: Not on file    Non-medical:  Not on file  Tobacco Use  . Smoking status: Never Smoker  . Smokeless tobacco: Never Used  Substance and Sexual Activity  . Alcohol use: No  . Drug use: No  . Sexual activity: Never  Lifestyle  . Physical activity:    Days per week: Not on file    Minutes per session: Not on file  . Stress: Not on file  Relationships  . Social connections:    Talks on phone: Not on file    Gets together: Not on file    Attends religious service: Not on file    Active member of club or organization: Not on file    Attends meetings of clubs or organizations: Not on file    Relationship status: Not on file  . Intimate partner violence:    Fear of current or ex partner: Not on  file    Emotionally abused: Not on file    Physically abused: Not on file    Forced sexual activity: Not on file  Other Topics Concern  . Not on file  Social History Narrative  . Not on file           Vanessa Kick, MD 01/27/18 [phone]

## 2018-01-28 ENCOUNTER — Telehealth (HOSPITAL_COMMUNITY): Payer: Self-pay | Admitting: Family Medicine

## 2018-01-28 NOTE — Telephone Encounter (Signed)
Dr. Meda Coffee recommends that he add delsym twice a day to tessalon use for cough. Spoke to patient and patient's wife with patient's permission.

## 2018-01-31 ENCOUNTER — Encounter (HOSPITAL_COMMUNITY): Payer: Self-pay | Admitting: *Deleted

## 2018-01-31 ENCOUNTER — Ambulatory Visit (INDEPENDENT_AMBULATORY_CARE_PROVIDER_SITE_OTHER): Payer: Medicare Other

## 2018-01-31 ENCOUNTER — Other Ambulatory Visit: Payer: Self-pay

## 2018-01-31 ENCOUNTER — Ambulatory Visit (HOSPITAL_COMMUNITY)
Admission: EM | Admit: 2018-01-31 | Discharge: 2018-01-31 | Disposition: A | Discharge status: Home / Self Care | Payer: Medicare Other

## 2018-01-31 DIAGNOSIS — J329 Chronic sinusitis, unspecified: Secondary | ICD-10-CM | POA: Diagnosis not present

## 2018-01-31 DIAGNOSIS — Z9109 Other allergy status, other than to drugs and biological substances: Secondary | ICD-10-CM | POA: Insufficient documentation

## 2018-01-31 DIAGNOSIS — R0981 Nasal congestion: Secondary | ICD-10-CM | POA: Insufficient documentation

## 2018-01-31 DIAGNOSIS — R05 Cough: Secondary | ICD-10-CM

## 2018-01-31 DIAGNOSIS — R059 Cough, unspecified: Secondary | ICD-10-CM

## 2018-01-31 MED ORDER — BENZONATATE 100 MG PO CAPS: 100.0000 mg | ORAL_CAPSULE | Freq: Three times a day (TID) | ORAL | 0 refills | Status: DC

## 2018-01-31 MED ORDER — DOXYCYCLINE HYCLATE 100 MG PO CAPS: 100.0000 mg | ORAL_CAPSULE | Freq: Two times a day (BID) | ORAL | 0 refills | Status: DC

## 2018-01-31 NOTE — ED Provider Notes (Signed)
MRN: [id number] DOB: 12-22-1945  Subjective:   Robert Estes is a 73 y.o. male presenting for 3 day history of moderate-severe worsening constant productive cough that is recurrent, has had this for the past 2 months. Has also had bilateral ear popping, runny nose. Has a difficult time laying down. He is worried that he has pneumonia. Has a tumor in his sinuses, has to have surgery tomorrow. Denies smoking cigarettes. Denies chest pain, shob, wheezing, n/v, abdominal pain. Takes Zyrtec, Flonase for allergies. Finished course of Augmentin 01/11/2018. Has also been using Tessalon capsule as prescribed previously.  No current facility-administered medications for this encounter.   Current Outpatient Medications:  .  benzonatate (TESSALON) 100 MG capsule, Take 1 capsule (100 mg total) by mouth every 8 (eight) hours., Disp: 21 capsule, Rfl: 0 .  CETIRIZINE HCL PO, Take by mouth., Disp: , Rfl:  .  fluticasone (FLONASE) 50 MCG/ACT nasal spray, Place 2 sprays into both nostrils daily., Disp: 16 g, Rfl: 2 .  metoprolol succinate (TOPROL-XL) 25 MG 24 hr tablet, Take 1 tablet (25 mg total) by mouth daily., Disp: 90 tablet, Rfl: 3 .  rosuvastatin (CRESTOR) 5 MG tablet, TAKE 1 TABLET BY MOUTH 5 TIMES A WEEK, Disp: 25 tablet, Rfl: 10 .  tamsulosin (FLOMAX) 0.4 MG CAPS capsule, Take 0.8 mg by mouth at bedtime. , Disp: , Rfl: 0 .  travoprost, benzalkonium, (TRAVATAN) 0.004 % ophthalmic solution, Place 1 drop into both eyes at bedtime. , Disp: , Rfl:  .  zolpidem (AMBIEN) 10 MG tablet, Take 1 tablet (10 mg total) by mouth at bedtime as needed for sleep., Disp: 30 tablet, Rfl: 1 .  acetaminophen (TYLENOL) 500 MG tablet, Take 1,500 mg by mouth every 6 (six) hours as needed for moderate pain., Disp: , Rfl:  .  amoxicillin-clavulanate (AUGMENTIN) 875-125 MG tablet, Take 1 tablet by mouth every 12 (twelve) hours., Disp: 14 tablet, Rfl: 0 .  aspirin 81 MG tablet, Take 81 mg by mouth daily., Disp: , Rfl:  .   nitroGLYCERIN (NITROSTAT) 0.4 MG SL tablet, Place 1 tablet (0.4 mg total) under the tongue every 5 (five) minutes as needed for chest pain., Disp: 25 tablet, Rfl: 5   Allergies  Allergen Reactions  . Codeine Itching    Past Medical History:  Diagnosis Date  . Arthritis    "hands, knees, elbows, fingers" (01/25/2013)  . Coronary artery disease    HX OF HEART STENTS X 2   2010--DR. H. SMITH IS PT'S CARDIOLOGIST, MILD AORTIC STENOSIS  . Dizziness   . Enlarged prostate    FLOMAX HELPS PT VOID  . Exertional shortness of breath   . GERD (gastroesophageal reflux disease)    OTC PREVACID AS NEEDED  . Glaucoma    BOTH EYES  . Gout   . Heart murmur   . Hyperlipidemia   . Hypertension   . Hypertension   . Iron deficiency anemia    hx  . Migraine    "monthly" (01/25/2013)  . Peripheral vascular disease ([place])    CLAUDICATION     Past Surgical History:  Procedure Laterality Date  . CHOLECYSTECTOMY N/A 01/25/2013   Procedure: LAPAROSCOPIC CHOLECYSTECTOMY WITH INTRAOPERATIVE CHOLANGIOGRAM;  Surgeon: [name], MD;  Location: [place];  Service: General;  Laterality: N/A;  . CORONARY ANGIOPLASTY WITH STENT PLACEMENT  11/27/2008   "2" (01/25/2013)  . EUS  12/24/2011   Procedure: FULL UPPER ENDOSCOPIC ULTRASOUND (EUS) RADIAL;  Surgeon: [name], MD;  [facility];  Service:  Endoscopy;  Laterality: N/A;  . INCISION AND DRAINAGE     thrombosed hemorrhoid  . LAPAROSCOPIC CHOLECYSTECTOMY  01/25/2013    Objective:   Vitals: BP 100/74   Pulse 78   Temp 97.6 F (36.4 C) (Temporal)   Resp 20   SpO2 99%   Physical Exam Constitutional:      General: He is not in acute distress.    Appearance: Normal appearance. He is well-developed. He is not ill-appearing, toxic-appearing or diaphoretic.  HENT:     Head: Normocephalic and atraumatic.     Right Ear: Tympanic membrane and external ear normal.     Left Ear: Tympanic membrane and external ear normal.     Nose:      Right Turbinates: Swollen.     Left Turbinates: Swollen.     Right Sinus: Maxillary sinus tenderness present.     Mouth/Throat:     Mouth: Mucous membranes are moist.     Pharynx: Oropharynx is clear. No oropharyngeal exudate or posterior oropharyngeal erythema.  Eyes:     General: No scleral icterus.       Right eye: No discharge.        Left eye: No discharge.     Extraocular Movements: Extraocular movements intact.     Conjunctiva/sclera: Conjunctivae normal.     Pupils: Pupils are equal, round, and reactive to light.  Neck:     Musculoskeletal: Normal range of motion and neck supple. No neck rigidity or muscular tenderness.  Cardiovascular:     Rate and Rhythm: Normal rate and regular rhythm.     Heart sounds: Normal heart sounds. No murmur. No friction rub. No gallop.   Pulmonary:     Effort: Pulmonary effort is normal. No respiratory distress.     Breath sounds: Normal breath sounds. No stridor. No wheezing, rhonchi or rales.  Lymphadenopathy:     Cervical: No cervical adenopathy.  Neurological:     Mental Status: He is alert and oriented to person, place, and time.  Psychiatric:        Mood and Affect: Mood normal.        Behavior: Behavior normal.        Thought Content: Thought content normal.     Dg Chest 2 View  Result Date: 01/31/2018 CLINICAL DATA:  Cough and feeling unwell for four months. EXAM: CHEST - 2 VIEW COMPARISON:  Chest radiograph December 03, 2016 FINDINGS: Cardiomediastinal silhouette is normal. No pleural effusions or focal consolidations. Trachea projects midline and there is no pneumothorax. Soft tissue planes and included osseous structures are non-suspicious. Mild thoracic spondylosis. Old mild T12 compression fracture. IMPRESSION: No active cardiopulmonary process. Electronically Signed   By: Elon Alas M.D.   On: 01/31/2018 17:05    Assessment and Plan :   Recurrent sinusitis  Sinus congestion  Cough  Environmental allergies  Cover  for recurrent sinusitis with doxycycline given his recent course with Augmentin and current physical exam findings.  Patient is to restart Zyrtec and hold off on Flonase.  Recommend supportive care otherwise. Return-to-clinic precautions discussed, patient verbalized understanding.    Jaynee Eagles, PA-C 01/31/18 1714

## 2018-01-31 NOTE — ED Triage Notes (Addendum)
C/O cough x 3 months.  STates getting worse, unable to lay down due to cough.  C/O thick sputum.  Harsh, wet cough noted.

## 2018-02-11 ENCOUNTER — Telehealth (HOSPITAL_COMMUNITY): Payer: Self-pay

## 2018-02-11 ENCOUNTER — Telehealth: Payer: Self-pay | Admitting: Family Medicine

## 2018-02-11 NOTE — Telephone Encounter (Signed)
Pt presented to Urgent Care requesting a refill on his antibiotics. States that he has had 3 rounds of antibiotics and is feeling worse again. Patient states he has an off and on headache that is worse when he lays down. Pt does have a pituitary tumor that he is having removed on 1/23. Per Loura Halt NP patient was informed that we cannot refill an antibiotic without seeing him.  I also instructed to the patient that he should call and let his surgeon know that he is having a headache with diagnosed sinus infections with no relief from the antibiotics. Pt verbalized understanding.

## 2018-02-11 NOTE — Telephone Encounter (Signed)
Copied from [place] [id number]. Topic: General - Other >> Feb 11, 2018  9:56 AM Bea Graff, NT wrote: Reason for CRM: Pts wife needing all medications that the pt has been taking to his email. She is requesting this to be done asap.  [email or web address]

## 2018-02-13 NOTE — Telephone Encounter (Signed)
Spoke with pt this Morning and medication list was picked-up on Wednesday.

## 2018-02-18 DIAGNOSIS — Z7982 Long term (current) use of aspirin: Secondary | ICD-10-CM | POA: Diagnosis not present

## 2018-02-18 DIAGNOSIS — Z885 Allergy status to narcotic agent status: Secondary | ICD-10-CM | POA: Diagnosis not present

## 2018-02-18 DIAGNOSIS — I2510 Atherosclerotic heart disease of native coronary artery without angina pectoris: Secondary | ICD-10-CM | POA: Diagnosis not present

## 2018-02-18 DIAGNOSIS — K219 Gastro-esophageal reflux disease without esophagitis: Secondary | ICD-10-CM | POA: Diagnosis not present

## 2018-02-18 DIAGNOSIS — Z9049 Acquired absence of other specified parts of digestive tract: Secondary | ICD-10-CM | POA: Diagnosis not present

## 2018-02-18 DIAGNOSIS — M109 Gout, unspecified: Secondary | ICD-10-CM | POA: Diagnosis not present

## 2018-02-18 DIAGNOSIS — I350 Nonrheumatic aortic (valve) stenosis: Secondary | ICD-10-CM | POA: Diagnosis not present

## 2018-02-18 DIAGNOSIS — I252 Old myocardial infarction: Secondary | ICD-10-CM | POA: Diagnosis not present

## 2018-02-18 DIAGNOSIS — M1712 Unilateral primary osteoarthritis, left knee: Secondary | ICD-10-CM | POA: Diagnosis not present

## 2018-02-18 DIAGNOSIS — Z955 Presence of coronary angioplasty implant and graft: Secondary | ICD-10-CM | POA: Diagnosis not present

## 2018-02-18 DIAGNOSIS — H409 Unspecified glaucoma: Secondary | ICD-10-CM | POA: Diagnosis not present

## 2018-02-18 DIAGNOSIS — J329 Chronic sinusitis, unspecified: Secondary | ICD-10-CM | POA: Diagnosis not present

## 2018-02-18 DIAGNOSIS — D352 Benign neoplasm of pituitary gland: Secondary | ICD-10-CM | POA: Diagnosis not present

## 2018-02-18 DIAGNOSIS — H269 Unspecified cataract: Secondary | ICD-10-CM | POA: Diagnosis not present

## 2018-02-18 DIAGNOSIS — J343 Hypertrophy of nasal turbinates: Secondary | ICD-10-CM | POA: Diagnosis not present

## 2018-02-18 DIAGNOSIS — Z79899 Other long term (current) drug therapy: Secondary | ICD-10-CM | POA: Diagnosis not present

## 2018-02-18 DIAGNOSIS — E785 Hyperlipidemia, unspecified: Secondary | ICD-10-CM | POA: Diagnosis not present

## 2018-02-18 DIAGNOSIS — I129 Hypertensive chronic kidney disease with stage 1 through stage 4 chronic kidney disease, or unspecified chronic kidney disease: Secondary | ICD-10-CM | POA: Diagnosis not present

## 2018-02-18 DIAGNOSIS — N189 Chronic kidney disease, unspecified: Secondary | ICD-10-CM | POA: Diagnosis not present

## 2018-02-19 DIAGNOSIS — D497 Neoplasm of unspecified behavior of endocrine glands and other parts of nervous system: Secondary | ICD-10-CM | POA: Insufficient documentation

## 2018-02-24 DIAGNOSIS — J3489 Other specified disorders of nose and nasal sinuses: Secondary | ICD-10-CM | POA: Diagnosis not present

## 2018-02-24 DIAGNOSIS — D352 Benign neoplasm of pituitary gland: Secondary | ICD-10-CM | POA: Diagnosis not present

## 2018-03-09 ENCOUNTER — Telehealth: Payer: Self-pay | Admitting: Interventional Cardiology

## 2018-03-09 NOTE — Telephone Encounter (Signed)
New Message   PTs wife is calling to Thank Dr Tamala Julian for all of his help and all went well with the surgery, 01/23  They want to thank the doctor for everything he has done and hopes he has a good 2020 Wishing the best for him

## 2018-03-17 DIAGNOSIS — D352 Benign neoplasm of pituitary gland: Secondary | ICD-10-CM | POA: Diagnosis not present

## 2018-03-17 DIAGNOSIS — J3489 Other specified disorders of nose and nasal sinuses: Secondary | ICD-10-CM | POA: Diagnosis not present

## 2018-03-24 DIAGNOSIS — D497 Neoplasm of unspecified behavior of endocrine glands and other parts of nervous system: Secondary | ICD-10-CM | POA: Diagnosis not present

## 2018-03-24 DIAGNOSIS — J3489 Other specified disorders of nose and nasal sinuses: Secondary | ICD-10-CM | POA: Diagnosis not present

## 2018-03-29 ENCOUNTER — Other Ambulatory Visit: Payer: Self-pay | Admitting: Physician Assistant

## 2018-03-29 DIAGNOSIS — G4700 Insomnia, unspecified: Secondary | ICD-10-CM

## 2018-03-31 DIAGNOSIS — D352 Benign neoplasm of pituitary gland: Secondary | ICD-10-CM | POA: Diagnosis not present

## 2018-04-01 ENCOUNTER — Telehealth: Payer: Self-pay | Admitting: General Practice

## 2018-04-01 DIAGNOSIS — G4700 Insomnia, unspecified: Secondary | ICD-10-CM

## 2018-04-01 NOTE — Telephone Encounter (Signed)
Copied from [place] [id number]. Topic: Quick Communication - Rx Refill/Question >> Apr 01, 2018 12:35 PM Gustavus Messing wrote: Medication: zolpidem (AMBIEN) 10 MG tablet   Has the patient contacted their pharmacy? Yes.   (Agent: If yes, when and what did the pharmacy advise?) Insurance is stating that patient needs an office visit but he just had major surgery on brain that Dr. Nolon Rod is aware of so he cannot come in to be seen yet. He needs the medication because he cannot sleep on top of being in pain.   Preferred Pharmacy (with phone number or street name): [facility] [phone] - [place], [address] - [address] [phone] (Phone) [phone] (Fax)    Agent: Please be advised that RX refills may take up to 3 business days. We ask that you follow-up with your pharmacy.

## 2018-04-02 DIAGNOSIS — H5711 Ocular pain, right eye: Secondary | ICD-10-CM | POA: Diagnosis not present

## 2018-04-02 NOTE — Telephone Encounter (Signed)
Please Advise pt needs OV for more refills.

## 2018-04-05 ENCOUNTER — Encounter: Payer: Self-pay | Admitting: Family Medicine

## 2018-04-05 ENCOUNTER — Other Ambulatory Visit: Payer: Self-pay

## 2018-04-05 ENCOUNTER — Ambulatory Visit (INDEPENDENT_AMBULATORY_CARE_PROVIDER_SITE_OTHER): Payer: Medicare Other | Admitting: Family Medicine

## 2018-04-05 VITALS — BP 133/79 | HR 69 | Temp 97.4°F | Ht 63.0 in | Wt 171.0 lb

## 2018-04-05 DIAGNOSIS — G4700 Insomnia, unspecified: Secondary | ICD-10-CM | POA: Diagnosis not present

## 2018-04-05 DIAGNOSIS — D352 Benign neoplasm of pituitary gland: Secondary | ICD-10-CM | POA: Diagnosis not present

## 2018-04-05 DIAGNOSIS — Z23 Encounter for immunization: Secondary | ICD-10-CM | POA: Diagnosis not present

## 2018-04-05 MED ORDER — HYDROCORTISONE 10 MG PO TABS: ORAL_TABLET | ORAL | Status: DC

## 2018-04-05 MED ORDER — TESTOSTERONE CYPIONATE 200 MG/ML IM SOLN: 200.0000 mg | INTRAMUSCULAR | 0 refills | Status: DC

## 2018-04-05 MED ORDER — ZOLPIDEM TARTRATE 10 MG PO TABS: 10.0000 mg | ORAL_TABLET | Freq: Every evening | ORAL | 4 refills | Status: DC | PRN

## 2018-04-05 NOTE — Patient Instructions (Signed)
° ° ° °  If you have lab work done today you will be contacted with your lab results within the next 2 weeks.  If you have not heard from us then please contact us. The fastest way to get your results is to register for My Chart. ° ° °IF you received an x-ray today, you will receive an invoice from [facility]. Please contact [facility] at [phone] with questions or concerns regarding your invoice.  ° °IF you received labwork today, you will receive an invoice from [facility]. Please contact [facility] at [phone] with questions or concerns regarding your invoice.  ° °Our billing staff will not be able to assist you with questions regarding bills from these companies. ° °You will be contacted with the lab results as soon as they are available. The fastest way to get your results is to activate your My Chart account. Instructions are located on the last page of this paperwork. If you have not heard from us regarding the results in 2 weeks, please contact this office. °  ° ° ° °

## 2018-04-05 NOTE — Telephone Encounter (Signed)
Pt's wife call requesting that zolpidem (AMBIEN) 10 MG tablet be called into  [facility] [phone] - [place], [address] [phone] (Phone) [phone] (Fax)  She was advised that pt will need an OV but states he is unable at present time due to brain surgery.She states if he can just have refill for 30 days he will come in

## 2018-04-05 NOTE — Telephone Encounter (Signed)
Pt has an appointment today at 140 with provider to go over medication management.

## 2018-04-05 NOTE — Telephone Encounter (Signed)
Called pt. And made aware that he does need an office appt. for possible refill of Ambien.  Pt. Requesting if he can schedule appt. within the next 2 days.  Scheduled appt. for today at 1:40 PM with Dr. Pamella Pert.  Pt. Agreed with plan.

## 2018-04-05 NOTE — Progress Notes (Signed)
3/9/20202:01 PM  [place] [birth year], 73 y.o. male [id number]  Chief Complaint  Patient presents with  . Medication Refill    refill on ambien, just has brain surgery 2 months ago.     HPI:   Patient is a 73 y.o. male with past medical history significant for HTN, CAD, BPH, AS, GERD,  pituitary adenoma, insomnia who presents today requesting refill of ambien  Previous PCP McVey, PA-C pmp reviewed Sleep study in Oct 2019, did not meet criteria for split study Had recent brain surgery for pituitary adenoma Overall doing well Has some occ headaches, does ok with tylenol Having some blurry vision - has seen eye doctor, had no concerns Has not been released back to work yet  Takes 5mg  ambien on most nights Able to fall asleep well  Denies any side effects  Sees endo, neurosurg, ENT at [facility] On cortef 10mg  AM and 5mg  PM started Jan 2020 by endo - he has been taking 10mg  BID On testosterone 200mg  IM weekly from urology 2/2 pit adenoma - records mention it should be every 14 days  Patient Care Team: Feb 2020, MD as PCP - General (Family Medicine) Rutherford Guys, MD as Consulting Physician (Cardiology) Belva Crome, MD as Referring Physician (Internal Medicine) Meyer Russel Johnney Killian, MD as Consulting Physician (Neurosurgery) Luciano Cutter, Wendee Beavers, MD as Referring Physician (Otolaryngology)  Fall Risk  04/05/2018 01/01/2018 11/05/2017 09/16/2017 09/07/2017  Falls in the past year? 0 0 No No No  Number falls in past yr: 0 - - - -  Injury with Fall? 0 - - - -     Depression screen [facility] 2/9 01/01/2018 11/05/2017 09/16/2017  Decreased Interest 0 - 0  Down, Depressed, Hopeless 0 0 0  PHQ - 2 Score 0 0 0    Allergies  Allergen Reactions  . Codeine Itching    Prior to Admission medications   Medication Sig Start Date End Date Taking? Authorizing Provider  acetaminophen (TYLENOL) 500 MG tablet Take 1,500 mg by mouth every 6 (six) hours as needed for moderate pain.   Yes  [provider]  aspirin 81 MG tablet Take 81 mg by mouth daily.   Yes [provider]  benzonatate (TESSALON) 100 MG capsule Take 1 capsule (100 mg total) by mouth every 8 (eight) hours. 01/31/18  Yes 14/5/20, PA-C  CETIRIZINE HCL PO Take by mouth.   Yes [provider]  fluticasone (FLONASE) 50 MCG/ACT nasal spray Place 2 sprays into both nostrils daily. 01/11/18  Yes 01/24/18, MD  metoprolol succinate (TOPROL-XL) 25 MG 24 hr tablet Take 1 tablet (25 mg total) by mouth daily. 10/08/17  Yes 22/12/19, MD  rosuvastatin (CRESTOR) 5 MG tablet TAKE 1 TABLET BY MOUTH 5 TIMES A WEEK 10/28/17  Yes 23/2/19, MD  tamsulosin (FLOMAX) 0.4 MG CAPS capsule Take 0.8 mg by mouth at bedtime.  02/24/14  Yes [provider]  travoprost, benzalkonium, (TRAVATAN) 0.004 % ophthalmic solution Place 1 drop into both eyes at bedtime.    Yes [provider]  zolpidem (AMBIEN) 10 MG tablet Take 1 tablet (10 mg total) by mouth at bedtime as needed for sleep. 12/18/17  Yes McVey, 12/31/17, PA-C  nitroGLYCERIN (NITROSTAT) 0.4 MG SL tablet Place 1 tablet (0.4 mg total) under the tongue every 5 (five) minutes as needed for chest pain. 10/27/17 01/25/18  02/07/18, MD    Past Medical History:  Diagnosis Date  .  Arthritis    "hands, knees, elbows, fingers" (01/25/2013)  . Coronary artery disease    HX OF HEART STENTS X 2   2010--DR. H. SMITH IS PT'S CARDIOLOGIST, MILD AORTIC STENOSIS  . Dizziness   . Enlarged prostate    FLOMAX HELPS PT VOID  . Exertional shortness of breath   . GERD (gastroesophageal reflux disease)    OTC PREVACID AS NEEDED  . Glaucoma    BOTH EYES  . Gout   . Heart murmur   . Hyperlipidemia   . Hypertension   . Hypertension   . Iron deficiency anemia    hx  . Migraine    "monthly" (01/25/2013)  . Peripheral vascular disease ([place])    CLAUDICATION    Past Surgical History:  Procedure Laterality Date  .  CHOLECYSTECTOMY N/A 01/25/2013   Procedure: LAPAROSCOPIC CHOLECYSTECTOMY WITH INTRAOPERATIVE CHOLANGIOGRAM;  Surgeon: [name], MD;  Location: [place];  Service: General;  Laterality: N/A;  . CORONARY ANGIOPLASTY WITH STENT PLACEMENT  11/27/2008   "2" (01/25/2013)  . EUS  12/24/2011   Procedure: FULL UPPER ENDOSCOPIC ULTRASOUND (EUS) RADIAL;  Surgeon: [name], MD;  [facility];  Service: Endoscopy;  Laterality: N/A;  . INCISION AND DRAINAGE     thrombosed hemorrhoid  . LAPAROSCOPIC CHOLECYSTECTOMY  01/25/2013    Social History   Tobacco Use  . Smoking status: Never Smoker  . Smokeless tobacco: Never Used  Substance Use Topics  . Alcohol use: No    Family History  Problem Relation Age of Onset  . Breast cancer Mother   . Cancer Mother   . Heart failure Mother   . Heart disease Father   . Heart attack Father     ROS Per hpi  OBJECTIVE:  Blood pressure 133/79, pulse 69, temperature (!) 97.4 F (36.3 C), temperature source Oral, height 5\' 3"  (1.6 m), weight 171 lb (77.6 kg), SpO2 96 %. Body mass index is 30.29 kg/m.   Physical Exam Vitals signs and nursing note reviewed.  Constitutional:      Appearance: He is well-developed.  HENT:     Head: Normocephalic and atraumatic.  Eyes:     Conjunctiva/sclera: Conjunctivae normal.     Pupils: Pupils are equal, round, and reactive to light.  Neck:     Musculoskeletal: Neck supple.  Cardiovascular:     Rate and Rhythm: Normal rate and regular rhythm.     Heart sounds: No murmur. No friction rub. No gallop.   Pulmonary:     Effort: Pulmonary effort is normal.     Breath sounds: Normal breath sounds. No wheezing or rales.  Skin:    General: Skin is warm and dry.  Neurological:     Mental Status: He is alert and oriented to person, place, and time.     ASSESSMENT and PLAN  1. Insomnia, unspecified type pmp reviewed. Doing well on current regime. Medication refilled. - zolpidem (AMBIEN) 10 MG  tablet; Take 1 tablet (10 mg total) by mouth at bedtime as needed for sleep.  2. Pituitary adenoma ([place]) Recently underwent resection. Chart reviewed. On testosterone and cortef. Managed by specialists at [facility].   Other orders - testosterone cypionate (DEPOTESTOSTERONE CYPIONATE) 200 MG/ML injection; Inject 1 mL (200 mg total) into the muscle once a week. - hydrocortisone (CORTEF) 10 MG tablet; Take 1 tablet (10 mg total) by mouth every morning AND 0.5 tablets (5 mg total) every evening. - Flu vaccine HIGH DOSE PF (Fluzone High dose)  Return in about  4 months (around 08/05/2018).    Rutherford Guys, MD Primary Care at [place], [place] [address] Ph.  [phone] Fax [phone]

## 2018-04-09 DIAGNOSIS — H401131 Primary open-angle glaucoma, bilateral, mild stage: Secondary | ICD-10-CM | POA: Diagnosis not present

## 2018-05-12 DIAGNOSIS — D352 Benign neoplasm of pituitary gland: Secondary | ICD-10-CM | POA: Diagnosis not present

## 2018-07-06 NOTE — Telephone Encounter (Signed)
Already saw Dr. Pamella Pert

## 2018-07-19 ENCOUNTER — Telehealth: Payer: Self-pay | Admitting: Interventional Cardiology

## 2018-07-19 DIAGNOSIS — E7849 Other hyperlipidemia: Secondary | ICD-10-CM

## 2018-07-19 NOTE — Telephone Encounter (Addendum)
Pt states over the last several weeks when he walks for about an hour in the evenings, that night he will have terrible cramps/pain behind the knee.  Stopped his statin for a few days and issue resolved.  States pain was getting so bad that he was having to use heating pads.  Currently takes Rosuvastatin 5mg  5x per week.  Will route to Dr. Tamala Julian for review. Pt said ok to give info to wife if he is unavailable.

## 2018-07-19 NOTE — Telephone Encounter (Signed)
Follow up     Pt is returning call, left a mobile number incase there is no answer on work number

## 2018-07-19 NOTE — Telephone Encounter (Signed)
Pt c/o medication issue:  1. Name of Medication: Rosuvastatin  2. How are you currently taking this medication (dosage and times per day)?  1 a day  3. Are you having a reaction (difficulty breathing--STAT)?  no  4. What is your medication issue? Cramps- would like to change to something else please

## 2018-07-19 NOTE — Telephone Encounter (Signed)
Left message to call back  

## 2018-07-22 NOTE — Telephone Encounter (Signed)
Spoke with pt and went over recommendations.  Pt agreeable to plan.

## 2018-07-22 NOTE — Telephone Encounter (Signed)
The patient has difficulty with statins.  He is high risk with both peripheral vascular disease and CAD.  I would recommend a visit to the lipid clinic with question being PCSK9 therapy rather than statin therapy which impair his quality of life.

## 2018-08-05 ENCOUNTER — Ambulatory Visit: Payer: Medicare Other | Admitting: Family Medicine

## 2018-08-05 ENCOUNTER — Encounter: Payer: Self-pay | Admitting: Family Medicine

## 2018-09-01 ENCOUNTER — Ambulatory Visit (INDEPENDENT_AMBULATORY_CARE_PROVIDER_SITE_OTHER): Payer: Medicare Other | Admitting: Pharmacist

## 2018-09-01 ENCOUNTER — Other Ambulatory Visit: Payer: Self-pay

## 2018-09-01 ENCOUNTER — Other Ambulatory Visit: Payer: Self-pay | Admitting: *Deleted

## 2018-09-01 ENCOUNTER — Telehealth: Payer: Self-pay | Admitting: Interventional Cardiology

## 2018-09-01 DIAGNOSIS — T466X5A Adverse effect of antihyperlipidemic and antiarteriosclerotic drugs, initial encounter: Secondary | ICD-10-CM | POA: Diagnosis not present

## 2018-09-01 DIAGNOSIS — G720 Drug-induced myopathy: Secondary | ICD-10-CM | POA: Diagnosis not present

## 2018-09-01 DIAGNOSIS — E7849 Other hyperlipidemia: Secondary | ICD-10-CM | POA: Diagnosis not present

## 2018-09-01 DIAGNOSIS — I350 Nonrheumatic aortic (valve) stenosis: Secondary | ICD-10-CM

## 2018-09-01 MED ORDER — REPATHA SURECLICK 140 MG/ML ~~LOC~~ SOAJ: 1.0000 "pen " | SUBCUTANEOUS | 11 refills | Status: DC

## 2018-09-01 NOTE — Patient Instructions (Addendum)
I will apply for Repatha injections through your insurance - they are shots that you will give in the fatty tissue of your stomach one every 2 weeks and will lower your LDL cholesterol by 60%  Your LDL goal is < 70 and was most recently 91 last fall when you were still taking rosuvastatin  I will also apply for a grant through the [employer] to make the [place] free and will let you know when you can pick up the medication at your pharmacy  Call [place], Pharmacist with any questions [phone]

## 2018-09-01 NOTE — Telephone Encounter (Signed)
Yes rx has been sent to pharmacy for $0 copay as pt has been approved for [facility], pt is aware.

## 2018-09-01 NOTE — Telephone Encounter (Signed)
Ann, called on behave of [place].  Iona Beard met with the pharmacist today. She would like to thank you for what you did to make is to happen, and all you have done for [facility]. The medication was approved that Dr. Tamala Julian wanted him to start.  Thank you for all you do for [facility].  Have a great summer and stay safe and say hi to Dr. Tamala Julian.

## 2018-09-01 NOTE — Telephone Encounter (Addendum)
Ann called on behave of Robert Estes.  He would like to thank you for today.  He was very please with all you help today.  Insurance company called him to tell him the medication was approved. He is very grateful for that. He just wants to make sure you will call in the medication to the pharmacy.  Have a great day and a great summer. Thank you for all your help.

## 2018-09-01 NOTE — Progress Notes (Signed)
Patient ID: Robert Estes [facility]                 DOB: 1945/08/10                    MRN: [id number]     HPI: Robert Estes is a pleasant 73 y.o. male patient referred to lipid clinic by Dr Robert Estes. PMH is significant for CAD s/p stenting, HTN, PVD, mild aortic stenosis, left carotid bruit, HLD, and arthritis. Patient has a history of statin intolerance and presents to clinic today for further management.  Patient presents today in good spirits. He has stopped taking rosuvastatin 5mg  due to muscle cramping which he has also experienced in the past with pravastatin 10mg  daily. Cramping continued even on low dose rosuvastatin 5mg  2-3 days per week. He does have some baseline cramping without statin therapy, but reports that when he takes statins he has trouble walking because the cramping is so much worse. He has tried using mustard and heating pads to help with the cramping.  Current Medications: rosuvastatin 5mg  5x/week - stopped due to cramping Intolerances: rosuvastatin 5mg  daily, once weekly, and 5x/week, pravastatin 10mg  daily - knee pain Risk Factors: PVD, CAD s/p stenting LDL goal: 70mg /dL  Diet: Raisin Bran or oatmeal for breakfast. Cut back on sweets, avoids fried food and red meat, eating more salads. Choose [place] bacon.  Exercise: Walks frequently 1-2 miles per day, works part time as a Dealer - active at work  Family History: Breast cancer in his mother; Cancer in his mother; Heart attack in his father; Heart disease in his father; Heart failure in his mother.  Social History: Denies tobacco, alcohol, and illicit drug use.  Labs: 10/08/17: TC 144, TG 134, HDL 26, LDL 91 (rosuvastatin 5mg  5x/week)  Past Medical History:  Diagnosis Date  . Arthritis    "hands, knees, elbows, fingers" (01/25/2013)  . Coronary artery disease    HX OF HEART STENTS X 2   2010--DR. H. Estes IS PT'S CARDIOLOGIST, MILD AORTIC STENOSIS  . Dizziness   . Enlarged prostate    FLOMAX HELPS PT VOID  .  Exertional shortness of breath   . GERD (gastroesophageal reflux disease)    OTC PREVACID AS NEEDED  . Glaucoma    BOTH EYES  . Gout   . Heart murmur   . Hyperlipidemia   . Hypertension   . Hypertension   . Iron deficiency anemia    hx  . Migraine    "monthly" (01/25/2013)  . Peripheral vascular disease ([place])    CLAUDICATION    Current Outpatient Medications on File Prior to Visit  Medication Sig Dispense Refill  . acetaminophen (TYLENOL) 500 MG tablet Take 1,500 mg by mouth every 6 (six) hours as needed for moderate pain.    Marland Kitchen aspirin 81 MG tablet Take 81 mg by mouth daily.    Marland Kitchen CETIRIZINE HCL PO Take by mouth.    . fluticasone (FLONASE) 50 MCG/ACT nasal spray Place 2 sprays into both nostrils daily. 16 g 2  . hydrocortisone (CORTEF) 10 MG tablet Take 1 tablet (10 mg total) by mouth every morning AND 0.5 tablets (5 mg total) every evening.    . metoprolol succinate (TOPROL-XL) 25 MG 24 hr tablet Take 1 tablet (25 mg total) by mouth daily. 90 tablet 3  . nitroGLYCERIN (NITROSTAT) 0.4 MG SL tablet Place 1 tablet (0.4 mg total) under the tongue every 5 (five) minutes as needed for chest pain. 25 tablet  5  . rosuvastatin (CRESTOR) 5 MG tablet TAKE 1 TABLET BY MOUTH 5 TIMES A WEEK 25 tablet 10  . tamsulosin (FLOMAX) 0.4 MG CAPS capsule Take 0.8 mg by mouth at bedtime.   0  . testosterone cypionate (DEPOTESTOSTERONE CYPIONATE) 200 MG/ML injection Inject 1 mL (200 mg total) into the muscle once a week. 10 mL 0  . travoprost, benzalkonium, (TRAVATAN) 0.004 % ophthalmic solution Place 1 drop into both eyes at bedtime.     Marland Kitchen zolpidem (AMBIEN) 10 MG tablet Take 1 tablet (10 mg total) by mouth at bedtime as needed for sleep. 30 tablet 4   No current facility-administered medications on file prior to visit.     Allergies  Allergen Reactions  . Codeine Itching    Assessment/Plan:  1. Hyperlipidemia -  LDL above goal < 70 due to hx of ASCVD. Pt is intolerant to lowest doses of 2 statins.  He is interested in trying PCSK9i therapy but states that copay of $47/month may be cost prohibitive. Will submit prior authorization and apply for [employer] grant to help with his copay.  Counseled pt on expected benefits, injection technique, and side effects.    Robert Estes, PharmD, BCACP, [facility] [address] [place], [place] [address] Phone: [phone]; Fax: [phone] 09/01/2018 9:43 AM  Addendum: Repatha PA approved through 03/04/19 and Robert Estes of $2500 approved. Rx sent to pharmacy and pt is aware.

## 2018-09-16 ENCOUNTER — Ambulatory Visit (INDEPENDENT_AMBULATORY_CARE_PROVIDER_SITE_OTHER): Payer: Medicare Other | Admitting: Family Medicine

## 2018-09-16 ENCOUNTER — Other Ambulatory Visit: Payer: Self-pay

## 2018-09-16 ENCOUNTER — Encounter: Payer: Self-pay | Admitting: Family Medicine

## 2018-09-16 VITALS — BP 120/80 | HR 60 | Temp 98.0°F | Ht 63.0 in | Wt 173.0 lb

## 2018-09-16 DIAGNOSIS — D352 Benign neoplasm of pituitary gland: Secondary | ICD-10-CM

## 2018-09-16 DIAGNOSIS — I739 Peripheral vascular disease, unspecified: Secondary | ICD-10-CM

## 2018-09-16 DIAGNOSIS — G4700 Insomnia, unspecified: Secondary | ICD-10-CM

## 2018-09-16 DIAGNOSIS — I2510 Atherosclerotic heart disease of native coronary artery without angina pectoris: Secondary | ICD-10-CM

## 2018-09-16 DIAGNOSIS — Z23 Encounter for immunization: Secondary | ICD-10-CM | POA: Diagnosis not present

## 2018-09-16 DIAGNOSIS — E291 Testicular hypofunction: Secondary | ICD-10-CM

## 2018-09-16 DIAGNOSIS — E7849 Other hyperlipidemia: Secondary | ICD-10-CM | POA: Diagnosis not present

## 2018-09-16 MED ORDER — FLUTICASONE PROPIONATE 50 MCG/ACT NA SUSP: 2.0000 | Freq: Every day | NASAL | 2 refills | Status: DC

## 2018-09-16 MED ORDER — CLOBETASOL PROPIONATE 0.05 % EX SOLN: 1.0000 "application " | Freq: Two times a day (BID) | CUTANEOUS | 1 refills | Status: DC

## 2018-09-16 MED ORDER — ZOLPIDEM TARTRATE 10 MG PO TABS: 10.0000 mg | ORAL_TABLET | Freq: Every evening | ORAL | 5 refills | Status: DC | PRN

## 2018-09-16 NOTE — Patient Instructions (Signed)
° ° ° °  If you have lab work done today you will be contacted with your lab results within the next 2 weeks.  If you have not heard from us then please contact us. The fastest way to get your results is to register for My Chart. ° ° °IF you received an x-ray today, you will receive an invoice from [facility]. Please contact [facility] at [phone] with questions or concerns regarding your invoice.  ° °IF you received labwork today, you will receive an invoice from [facility]. Please contact [facility] at [phone] with questions or concerns regarding your invoice.  ° °Our billing staff will not be able to assist you with questions regarding bills from these companies. ° °You will be contacted with the lab results as soon as they are available. The fastest way to get your results is to activate your My Chart account. Instructions are located on the last page of this paperwork. If you have not heard from us regarding the results in 2 weeks, please contact this office. °  ° ° ° °

## 2018-09-16 NOTE — Progress Notes (Signed)
8/20/20204:56 PM  [place] [birth year], 73 y.o., male [id number]  Chief Complaint  Patient presents with   Hypertension   Hyperlipidemia    taking injestion for cholestorol, says it is much better than the pill.    Medication Refill    HPI:   Patient is a 73 y.o. male with past medical history significant for HTN, CAD s/p stenting, PVD, BPH, mild AS, GERD,  pituitary adenoma, insomnia who presents today for routine followup  Last OV March 2020  Overall he is doing well and has no acute concerns  Saw cards 2 weeks ago, started on PCKS91 therapy, has taken one dose, so far has been tolerating well  Lab Results  Component Value Date   CHOL 144 10/08/2017   HDL 26 (L) 10/08/2017   LDLCALC 91 10/08/2017   TRIG 134 10/08/2017   CHOLHDL 5.5 (H) 10/08/2017    Has been seeing endo at [facility] Has been doing telemedicine visit Sees them again next month  Sees urology in sept He checks testosterone and psa  Requesting refill of ambien Takes on most nights for sleep, 1/2 - 1 tab Has been on it for years Denies any side effects  Patient Care Team: Rutherford Guys, MD as PCP - General (Family Medicine) Belva Crome, MD as Consulting Physician (Cardiology) Meyer Russel, MD as Referring Physician (Internal Medicine) Johnney Killian Luciano Cutter, MD as Consulting Physician (Neurosurgery) Wendee Beavers, Deidre Ala, MD as Referring Physician (Otolaryngology)  Depression screen [facility] 2/9 09/16/2018 01/01/2018 11/05/2017  Decreased Interest 0 0 -  Down, Depressed, Hopeless 0 0 0  PHQ - 2 Score 0 0 0    Fall Risk  09/16/2018 04/05/2018 01/01/2018 11/05/2017 09/16/2017  Falls in the past year? 0 0 0 No No  Number falls in past yr: 0 0 - - -  Injury with Fall? 0 0 - - -     Allergies  Allergen Reactions   Codeine Itching    Prior to Admission medications   Medication Sig Start Date End Date Taking? Authorizing Provider  acetaminophen (TYLENOL) 500 MG tablet Take 1,500 mg by mouth  every 6 (six) hours as needed for moderate pain.   Yes [provider]  aspirin 81 MG tablet Take 81 mg by mouth daily.   Yes [provider]  carboxymethylcellulose (REFRESH PLUS) 0.5 % SOLN 1 drop 2 (two) times daily as needed.   Yes [provider]  CETIRIZINE HCL PO Take by mouth.   Yes [provider]  Evolocumab (REPATHA SURECLICK) 622 MG/ML SOAJ Inject 1 pen into the skin every 14 (fourteen) days. 09/01/18  Yes Belva Crome, MD  fluticasone [facility]) 50 MCG/ACT nasal spray Place 2 sprays into both nostrils daily. 01/11/18  Yes Raylene Everts, MD  hydrocortisone (CORTEF) 10 MG tablet Take 1 tablet (10 mg total) by mouth every morning AND 0.5 tablets (5 mg total) every evening. Patient taking differently: Taking 5mg  in AM and 5mg  in PM 04/05/18  Yes Rutherford Guys, MD  metoprolol succinate (TOPROL-XL) 25 MG 24 hr tablet Take 1 tablet (25 mg total) by mouth daily. 10/08/17  Yes Belva Crome, MD  Multiple Vitamin (MULTIVITAMIN) tablet Take 1 tablet by mouth daily.   Yes [provider]  tamsulosin (FLOMAX) 0.4 MG CAPS capsule Take 0.8 mg by mouth at bedtime.  02/24/14  Yes [provider]  testosterone cypionate (DEPOTESTOSTERONE CYPIONATE) 200 MG/ML injection Inject 1 mL (200 mg total) into the muscle once  a week. 04/05/18  Yes Rutherford Guys, MD  travoprost, benzalkonium, (TRAVATAN) 0.004 % ophthalmic solution Place 1 drop into both eyes at bedtime.    Yes [provider]  TURMERIC PO Take 2 capsules by mouth daily.   Yes [provider]  zolpidem (AMBIEN) 10 MG tablet Take 1 tablet (10 mg total) by mouth at bedtime as needed for sleep. 04/05/18  Yes Rutherford Guys, MD  nitroGLYCERIN (NITROSTAT) 0.4 MG SL tablet Place 1 tablet (0.4 mg total) under the tongue every 5 (five) minutes as needed for chest pain. 10/27/17 01/25/18  Belva Crome, MD    Past Medical History:  Diagnosis Date   Arthritis    "hands, knees,  elbows, fingers" (01/25/2013)   Coronary artery disease    HX OF HEART STENTS X 2   2010--DR. Linard Millers IS PT'S CARDIOLOGIST, MILD AORTIC STENOSIS   Dizziness    Enlarged prostate    FLOMAX HELPS PT VOID   Exertional shortness of breath    GERD (gastroesophageal reflux disease)    OTC PREVACID AS NEEDED   Glaucoma    BOTH EYES   Gout    Heart murmur    Hyperlipidemia    Hypertension    Hypertension    Iron deficiency anemia    hx   Migraine    "monthly" (01/25/2013)   Peripheral vascular disease ([place])    CLAUDICATION    Past Surgical History:  Procedure Laterality Date   CHOLECYSTECTOMY N/A 01/25/2013   Procedure: LAPAROSCOPIC CHOLECYSTECTOMY WITH INTRAOPERATIVE CHOLANGIOGRAM;  Surgeon: [name], MD;  Location: [facility];  Service: General;  Laterality: N/A;   CORONARY ANGIOPLASTY WITH STENT PLACEMENT  11/27/2008   "2" (01/25/2013)   EUS  12/24/2011   Procedure: FULL UPPER ENDOSCOPIC ULTRASOUND (EUS) RADIAL;  Surgeon: [name], MD;  Location: [facility];  Service: Endoscopy;  Laterality: N/A;   INCISION AND DRAINAGE     thrombosed hemorrhoid   LAPAROSCOPIC CHOLECYSTECTOMY  01/25/2013    Social History   Tobacco Use   Smoking status: Never Smoker   Smokeless tobacco: Never Used  Substance Use Topics   Alcohol use: No    Family History  Problem Relation Age of Onset   Breast cancer Mother    Cancer Mother    Heart failure Mother    Heart disease Father    Heart attack Father     Review of Systems  Constitutional: Negative for chills and fever.  Respiratory: Negative for cough and shortness of breath.   Cardiovascular: Negative for chest pain, palpitations and leg swelling.  Gastrointestinal: Negative for abdominal pain, nausea and vomiting.     OBJECTIVE:  Today's Vitals   09/16/18 1641  BP: 120/80  Pulse: 60  Temp: 98 F (36.7 C)  TempSrc: Oral  SpO2: 98%  Weight: 173 lb (78.5 kg)  Height: 5\' 3"  (1.6 m)    Body mass index is 30.65 kg/m.   Physical Exam Vitals signs and nursing note reviewed.  Constitutional:      Appearance: He is well-developed.  HENT:     Head: Normocephalic and atraumatic.  Eyes:     Conjunctiva/sclera: Conjunctivae normal.     Pupils: Pupils are equal, round, and reactive to light.  Neck:     Musculoskeletal: Neck supple.  Cardiovascular:     Rate and Rhythm: Normal rate and regular rhythm.     Heart sounds: No murmur. No friction rub. No gallop.   Pulmonary:     Effort:  Pulmonary effort is normal.     Breath sounds: Normal breath sounds. No wheezing or rales.  Skin:    General: Skin is warm and dry.  Neurological:     Mental Status: He is alert and oriented to person, place, and time.     No results found for this or any previous visit (from the past 24 hour(s)).  No results found.  ASSESSMENT and PLAN  1. Insomnia, unspecified type Controlled. Continue current regime. pmp reviewed - zolpidem (AMBIEN) 10 MG tablet; Take 1 tablet (10 mg total) by mouth at bedtime as needed for sleep.  2. Other hyperlipidemia Managed by cards, started on repatha as stain intolerant - Comprehensive metabolic panel; Future - Lipid panel; Future  3. Need for vaccination - Flu Vaccine QUAD High Dose(Fluad)  4. Peripheral vascular disease ([place]) 5. Coronary artery disease involving native coronary artery of native heart without angina pectoris Stable. Managed by cards  6. Pituitary adenoma ([place]) Managed by endo  7. Testosterone deficiency in male Managed by urology  Other orders - fluticasone (FLONASE) 50 MCG/ACT nasal spray; Place 2 sprays into both nostrils daily. - clobetasol (TEMOVATE) 0.05 % external solution; Apply 1 application topically 2 (two) times daily.  Return in about 6 months (around 03/19/2019).    Rutherford Guys, MD Primary Care at [facility], [place] [address] Ph.  [phone] Fax [phone]

## 2018-09-22 ENCOUNTER — Other Ambulatory Visit: Payer: Self-pay

## 2018-09-22 ENCOUNTER — Ambulatory Visit (INDEPENDENT_AMBULATORY_CARE_PROVIDER_SITE_OTHER): Payer: Medicare Other | Admitting: Emergency Medicine

## 2018-09-22 DIAGNOSIS — E7849 Other hyperlipidemia: Secondary | ICD-10-CM | POA: Diagnosis not present

## 2018-09-23 LAB — LIPID PANEL
Chol/HDL Ratio: 3.9 ratio (ref 0.0–5.0)
Cholesterol, Total: 129 mg/dL (ref 100–199)
HDL: 33 mg/dL — ABNORMAL LOW (ref >39)
LDL Calculated: 71 mg/dL (ref 0–99)
Triglycerides: 126 mg/dL (ref 0–149)
VLDL Cholesterol Cal: 25 mg/dL (ref 5–40)

## 2018-09-23 LAB — COMPREHENSIVE METABOLIC PANEL
ALT: 20 IU/L (ref 0–44)
AST: 23 IU/L (ref 0–40)
Albumin/Globulin Ratio: 2.5 — ABNORMAL HIGH (ref 1.2–2.2)
Albumin: 4.7 g/dL (ref 3.8–4.8)
Alkaline Phosphatase: 66 IU/L (ref 39–117)
BUN/Creatinine Ratio: 8 — ABNORMAL LOW (ref 10–24)
BUN: 13 mg/dL (ref 8–27)
Bilirubin Total: 0.4 mg/dL (ref 0.0–1.2)
CO2: 25 mmol/L (ref 20–29)
Calcium: 9.1 mg/dL (ref 8.6–10.2)
Chloride: 100 mmol/L (ref 96–106)
Creatinine, Ser: 1.58 mg/dL — ABNORMAL HIGH (ref 0.76–1.27)
GFR calc Af Amer: 50 mL/min/{1.73_m2} — ABNORMAL LOW (ref >59)
GFR calc non Af Amer: 44 mL/min/{1.73_m2} — ABNORMAL LOW (ref >59)
Globulin, Total: 1.9 g/dL (ref 1.5–4.5)
Glucose: 86 mg/dL (ref 65–99)
Potassium: 4.7 mmol/L (ref 3.5–5.2)
Sodium: 139 mmol/L (ref 134–144)
Total Protein: 6.6 g/dL (ref 6.0–8.5)

## 2018-10-05 ENCOUNTER — Ambulatory Visit: Payer: Medicare Other | Admitting: Family Medicine

## 2018-10-11 ENCOUNTER — Encounter: Payer: Self-pay | Admitting: Radiology

## 2018-10-12 DIAGNOSIS — K644 Residual hemorrhoidal skin tags: Secondary | ICD-10-CM | POA: Diagnosis not present

## 2018-10-15 DIAGNOSIS — D352 Benign neoplasm of pituitary gland: Secondary | ICD-10-CM | POA: Diagnosis not present

## 2018-10-18 ENCOUNTER — Other Ambulatory Visit: Payer: Self-pay

## 2018-10-18 DIAGNOSIS — E7849 Other hyperlipidemia: Secondary | ICD-10-CM

## 2018-10-23 IMAGING — NM NM MISC PROCEDURE
3 series · 18 of 18 positions shown · non-contrast
Comparison: none

[Series 1: stress-gsp_(id)_sa · 6.4mm · 6.40mm/px · 6 of 512 frames shown]
[frame 43/512]
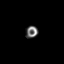
[frame 128/512]
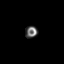
[frame 214/512]
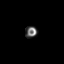
[frame 299/512]
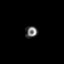
[frame 384/512]
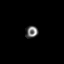
[frame 470/512]
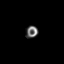

[Series 1: stress-sum-em_(id)_sa · 6.4mm · 6.40mm/px · 6 of 64 frames shown]
[frame 6/64]
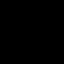
[frame 16/64]
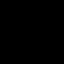
[frame 27/64]
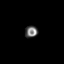
[frame 38/64]
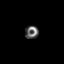
[frame 48/64]
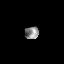
[frame 59/64]
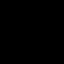

[Series 1: rest_(id)_sa · 6.4mm · 6.40mm/px · 6 of 64 frames shown]
[frame 6/64]
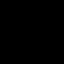
[frame 16/64]
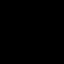
[frame 27/64]
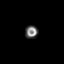
[frame 38/64]
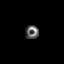
[frame 48/64]
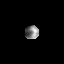
[frame 59/64]
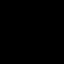

[18 of 18 positions shown; findings below may reference images not displayed]

Canned report from images found in remote index.

Refer to host system for actual result text.

## 2018-10-25 ENCOUNTER — Other Ambulatory Visit: Payer: Self-pay | Admitting: Interventional Cardiology

## 2018-10-27 DIAGNOSIS — D352 Benign neoplasm of pituitary gland: Secondary | ICD-10-CM | POA: Diagnosis not present

## 2018-11-22 ENCOUNTER — Encounter (INDEPENDENT_AMBULATORY_CARE_PROVIDER_SITE_OTHER): Payer: Self-pay

## 2018-11-22 ENCOUNTER — Other Ambulatory Visit: Payer: Medicare Other | Admitting: *Deleted

## 2018-11-22 ENCOUNTER — Other Ambulatory Visit: Payer: Self-pay

## 2018-11-22 DIAGNOSIS — E7849 Other hyperlipidemia: Secondary | ICD-10-CM | POA: Diagnosis not present

## 2018-11-22 LAB — HEPATIC FUNCTION PANEL
ALT: 24 IU/L (ref 0–44)
AST: 22 IU/L (ref 0–40)
Albumin: 4.4 g/dL (ref 3.7–4.7)
Alkaline Phosphatase: 79 IU/L (ref 39–117)
Bilirubin Total: 0.4 mg/dL (ref 0.0–1.2)
Bilirubin, Direct: 0.13 mg/dL (ref 0.00–0.40)
Total Protein: 6.7 g/dL (ref 6.0–8.5)

## 2018-11-22 LAB — LIPID PANEL
Chol/HDL Ratio: 3.8 ratio (ref 0.0–5.0)
Cholesterol, Total: 135 mg/dL (ref 100–199)
HDL: 36 mg/dL — ABNORMAL LOW (ref >39)
LDL Chol Calc (NIH): 79 mg/dL (ref 0–99)
Triglycerides: 109 mg/dL (ref 0–149)
VLDL Cholesterol Cal: 20 mg/dL (ref 5–40)

## 2018-11-23 ENCOUNTER — Telehealth: Payer: Self-pay

## 2018-11-23 NOTE — Telephone Encounter (Signed)
Called patient to discuss recent lipid panel and potentially add on Zetia 10mg  daily. At this time, the patient would prefer to continue modifying diet and exercise as well as give the Repatha another few doses to further lower LDL. His muscle pain has improved, so he is able to get up and walk much better than before. Will follow up in the beginning of December for fasting lipid panel.

## 2018-11-26 NOTE — Telephone Encounter (Signed)
Wife called office to review labs results with [place]. Reviewed results and answered diet questions. Wife would like to repeat labs at end of January. Will call later to schedule.

## 2018-12-01 ENCOUNTER — Telehealth: Payer: Self-pay | Admitting: Interventional Cardiology

## 2018-12-01 ENCOUNTER — Other Ambulatory Visit: Payer: Self-pay

## 2018-12-01 ENCOUNTER — Ambulatory Visit (HOSPITAL_COMMUNITY): Payer: Medicare Other | Attending: Cardiovascular Disease

## 2018-12-01 DIAGNOSIS — I350 Nonrheumatic aortic (valve) stenosis: Secondary | ICD-10-CM | POA: Insufficient documentation

## 2018-12-01 MED ORDER — NITROGLYCERIN 0.4 MG SL SUBL: 0.4000 mg | SUBLINGUAL_TABLET | SUBLINGUAL | 0 refills | Status: DC | PRN

## 2018-12-01 NOTE — Telephone Encounter (Signed)
New Message   *STAT* If patient is at the pharmacy, call can be transferred to refill team.   1. Which medications need to be refilled? (please list name of each medication and dose if known) nitroGLYCERIN (NITROSTAT) 0.4 MG SL tablet(Expired)  2. Which pharmacy/location (including street and city if local pharmacy) is medication to be sent to? [facility] [phone] - [place], [address]  3. Do they need a 30 day or 90 day supply? 30 day

## 2018-12-01 NOTE — Telephone Encounter (Signed)
Pt's medication was sent to pt's pharmacy as requested. Confirmation received.  °

## 2018-12-07 NOTE — Progress Notes (Signed)
Cardiology Office Note:    Date:  12/08/2018   ID:  [place], [place] Feb 10, 1945, MRN [id number]  PCP:  Rutherford Guys, MD  Cardiologist:  Sinclair Grooms, MD   Referring MD: Rutherford Guys, MD   Chief Complaint  Patient presents with  . Coronary Artery Disease  . Cardiac Valve Problem    Bicuspid aortic valve    History of Present Illness:    GUST SALZ is a 73 y.o. male with a hx of CAD with prior stent implantation and RCA and circumflex 2010, hypertension, probable bicuspid aortic valve with mild aortic stenosis, and left carotid bruit. Also has history of hyperlipidemia.  [place] is doing quite well.  He has recovered from transsphenoidal hypophysectomy.  He had no cardiac complications during the microadenoma resection performed at [facility].  He is back at work.  He had some early vision disturbance but this is improved.  He inquires about future management of his bicuspid aortic valve.  The valve is currently not in condition to require any particular therapy.  We discussed the natural history of aortic valve disease.  We discussed the cardinal symptoms of aortic stenosis that should be monitored and reported if present.  We discussed secondary prevention of CAD.  Past Medical History:  Diagnosis Date  . Arthritis    "hands, knees, elbows, fingers" (01/25/2013)  . Coronary artery disease    HX OF HEART STENTS X 2   2010--DR. H.  IS PT'S CARDIOLOGIST, MILD AORTIC STENOSIS  . Dizziness   . Enlarged prostate    FLOMAX HELPS PT VOID  . Exertional shortness of breath   . GERD (gastroesophageal reflux disease)    OTC PREVACID AS NEEDED  . Glaucoma    BOTH EYES  . Gout   . Heart murmur   . Hyperlipidemia   . Hypertension   . Hypertension   . Iron deficiency anemia    hx  . Migraine    "monthly" (01/25/2013)  . Peripheral vascular disease ([place])    CLAUDICATION    Past Surgical History:  Procedure Laterality Date  .  CHOLECYSTECTOMY N/A 01/25/2013   Procedure: LAPAROSCOPIC CHOLECYSTECTOMY WITH INTRAOPERATIVE CHOLANGIOGRAM;  Surgeon: [name], MD;  Location: [facility];  Service: General;  Laterality: N/A;  . CORONARY ANGIOPLASTY WITH STENT PLACEMENT  11/27/2008   "2" (01/25/2013)  . EUS  12/24/2011   Procedure: FULL UPPER ENDOSCOPIC ULTRASOUND (EUS) RADIAL;  Surgeon: [name], MD;  Location[facility];  Service: Endoscopy;  Laterality: N/A;  . INCISION AND DRAINAGE     thrombosed hemorrhoid  . LAPAROSCOPIC CHOLECYSTECTOMY  01/25/2013    Current Medications: Current Meds  Medication Sig  . acetaminophen (TYLENOL) 500 MG tablet Take 1,500 mg by mouth every 6 (six) hours as needed for moderate pain.  Marland Kitchen aspirin 81 MG tablet Take 81 mg by mouth daily.  . carboxymethylcellulose (REFRESH PLUS) 0.5 % SOLN 1 drop 2 (two) times daily as needed.  Marland Kitchen CETIRIZINE HCL PO Take by mouth.  . clobetasol (TEMOVATE) 0.05 % external solution Apply 1 application topically 2 (two) times daily.  . Evolocumab (REPATHA SURECLICK) XX123456 MG/ML SOAJ Inject 1 pen into the skin every 14 (fourteen) days.  . fluticasone (FLONASE) 50 MCG/ACT nasal spray Place 2 sprays into both nostrils daily.  . hydrocortisone (CORTEF) 10 MG tablet Take 1 tablet (10 mg total) by mouth every morning AND 0.5 tablets (5 mg total) every evening.  . metoprolol succinate (TOPROL-XL) 25  MG 24 hr tablet Take 1 tablet (25 mg total) by mouth daily. Please keep upcoming appt for future refills.  . Multiple Vitamin (MULTIVITAMIN) tablet Take 1 tablet by mouth daily.  . nitroGLYCERIN (NITROSTAT) 0.4 MG SL tablet Place 1 tablet (0.4 mg total) under the tongue every 5 (five) minutes as needed for chest pain. Please keep upcoming appt in November. Thanks  . tamsulosin (FLOMAX) 0.4 MG CAPS capsule Take 0.8 mg by mouth at bedtime.   Marland Kitchen testosterone cypionate (DEPOTESTOSTERONE CYPIONATE) 200 MG/ML injection Inject 1 mL (200 mg total) into the muscle once a week.  .  travoprost, benzalkonium, (TRAVATAN) 0.004 % ophthalmic solution Place 1 drop into both eyes at bedtime.   . TURMERIC PO Take 2 capsules by mouth daily.  Marland Kitchen zolpidem (AMBIEN) 10 MG tablet Take 1 tablet (10 mg total) by mouth at bedtime as needed for sleep.     Allergies:   Codeine   Social History   Socioeconomic History  . Marital status: Married    Spouse name: Not on file  . Number of children: Not on file  . Years of education: Not on file  . Highest education level: Not on file  Occupational History  . Not on file  Social Needs  . Financial resource strain: Not on file  . Food insecurity    Worry: Not on file    Inability: Not on file  . Transportation needs    Medical: Not on file    Non-medical: Not on file  Tobacco Use  . Smoking status: Never Smoker  . Smokeless tobacco: Never Used  Substance and Sexual Activity  . Alcohol use: No  . Drug use: No  . Sexual activity: Never  Lifestyle  . Physical activity    Days per week: Not on file    Minutes per session: Not on file  . Stress: Not on file  Relationships  . Social Herbalist on phone: Not on file    Gets together: Not on file    Attends religious service: Not on file    Active member of club or organization: Not on file    Attends meetings of clubs or organizations: Not on file    Relationship status: Not on file  Other Topics Concern  . Not on file  Social History Narrative  . Not on file     Family History: The patient's family history includes Breast cancer in his mother; Cancer in his mother; Heart attack in his father; Heart disease in his father; Heart failure in his mother.  ROS:   Please see the history of present illness.    Transsphenoidal hypophysis ectomy with vision disturbance following the procedure now slowly improving.  He has anxiety about his underlying cardiac condition.  He has low back discomfort at times.  This is been much better lately.  He is walking up to 2 miles  per day.  All other systems reviewed and are negative.  EKGs/Labs/Other Studies Reviewed:    The following studies were reviewed today : 2D Doppler echocardiogram 12/01/2018 IMPRESSIONS    1. Left ventricular ejection fraction, by visual estimation, is 60 to 65%. The left ventricle has normal function. Left ventricular septal wall thickness was mildly increased. There is no left ventricular hypertrophy.  2. Global right ventricle has normal systolic function.The right ventricular size is normal. No increase in right ventricular wall thickness.  3. Left atrial size was normal.  4. Right atrial size was normal.  5. Mild mitral annular calcification.  6. The mitral valve is normal in structure. Mild mitral valve regurgitation. No evidence of mitral stenosis.  7. The tricuspid valve is normal in structure. Tricuspid valve regurgitation is mild.  8. The aortic valve has an indeterminant number of cusps. Aortic valve regurgitation is trivial. Mild to moderate aortic valve stenosis.  9. The pulmonic valve was grossly normal. Pulmonic valve regurgitation is mild. 10. Mildly elevated pulmonary artery systolic pressure. 11. The inferior vena cava is normal in size with greater than 50% respiratory variability, suggesting right atrial pressure of 3 mmHg.  EKG:  EKG normal sinus rhythm, small inferior Q waves, poor R wave progression V1 through V3.  Essentially normal in appearance and unchanged when compared to thousand 19.  Recent Labs: 09/22/2018: BUN 13; Creatinine, Ser 1.58; Potassium 4.7; Sodium 139 11/22/2018: ALT 24  Recent Lipid Panel    Component Value Date/Time   CHOL 135 11/22/2018 0847   TRIG 109 11/22/2018 0847   HDL 36 (L) 11/22/2018 0847   CHOLHDL 3.8 11/22/2018 0847   CHOLHDL 5.5 (H) 12/14/2014 0901   VLDL 17 12/14/2014 0901   LDLCALC 79 11/22/2018 0847    Physical Exam:    VS:  BP (!) 142/76   Pulse 67   Ht 5\' 3"  (1.6 m)   Wt 174 lb 12.8 oz (79.3 kg)   SpO2 97%    BMI 30.96 kg/m     Wt Readings from Last 3 Encounters:  12/08/18 174 lb 12.8 oz (79.3 kg)  09/16/18 173 lb (78.5 kg)  04/05/18 171 lb (77.6 kg)     GEN: He is mast.  Not obese.. No acute distress HEENT: Normal NECK: No JVD. LYMPHATICS: No lymphadenopathy CARDIAC: 2-3 over 6 right upper sternal border and left mid sternal border crescendo decrescendo systolic murmur.  RRR without diastolic3 murmur, gallop, or edema. VASCULAR:  Normal Pulses. No bruits. RESPIRATORY:  Clear to auscultation without rales, wheezing or rhonchi  ABDOMEN: Soft, non-tender, non-distended, No pulsatile mass, MUSCULOSKELETAL: No deformity  SKIN: Warm and dry NEUROLOGIC:  Alert and oriented x 3 PSYCHIATRIC:  Normal affect   ASSESSMENT:    1. Aortic valve stenosis, etiology of cardiac valve disease unspecified   2. Coronary artery disease involving native coronary artery of native heart without angina pectoris   3. Essential hypertension   4. Peripheral vascular disease ([place])   5. Pituitary adenoma (H[facility])   6. Statin myopathy   7. Other hyperlipidemia   8. Educated about COVID-19 virus infection    PLAN:    In order of problems listed above:  1. Probable bicuspid aortic valve with mild aortic stenosis.  We discussed the findings and natural history of aortic stenosis.  We will continue to follow. 2. Secondary prevention discussed.  This included significant conversation concerning LDL lowering.  Most recent LDL in August was 71. 3. Target blood pressure 130/80 mmHg. 4. As pounding bilateral posterior tibial pulses. 5. Status post transsphenoidal hypophysis ectomy. 6. He is on PCSK9 therapy. 7. See discussion under secondary prevention above. 8. The 3W's is understood and practiced by the patient to avoid COVID-19 infection.  Overall education and awareness concerning primary/secondary risk prevention was discussed in detail: LDL less than 70, hemoglobin A1c less than 7, blood pressure target less than  130/80 mmHg, >150 minutes of moderate aerobic activity per week, avoidance of smoking, weight control (via diet and exercise), and continued surveillance/management of/for obstructive sleep apnea.  Natural history of aortic valve stenosis was  discussed in detail.  Cardinal symptoms of angina, syncope, and dyspnea were reviewed and significance relative to prognosis was described.  The importance of sequential imaging for disease monitoring was emphasized.  Work-up including possible heart catheterization and CT angiography were described as essential components of staging for therapy.  Treatment options, TAVR and SAVR, were discussed in some detail with emphasis on TAVR.    Medication Adjustments/Labs and Tests Ordered: Current medicines are reviewed at length with the patient today.  Concerns regarding medicines are outlined above.  Orders Placed This Encounter  Procedures  . EKG 12-Lead   No orders of the defined types were placed in this encounter.   Patient Instructions  Medication Instructions:  Your physician recommends that you continue on your current medications as directed. Please refer to the Current Medication list given to you today.  *If you need a refill on your cardiac medications before your next appointment, please call your pharmacy*  Lab Work: None If you have labs (blood work) drawn today and your tests are completely normal, you will receive your results only by: Marland Kitchen MyChart Message (if you have MyChart) OR . A paper copy in the mail If you have any lab test that is abnormal or we need to change your treatment, we will call you to review the results.  Testing/Procedures: None  Follow-Up: At [facility], you and your health needs are our priority.  As part of our continuing mission to provide you with exceptional heart care, we have created designated Provider Care Teams.  These Care Teams include your primary Cardiologist (physician) and Advanced Practice  Providers (APPs -  Physician Assistants and Nurse Practitioners) who all work together to provide you with the care you need, when you need it.  Your next appointment:   12 months  The format for your next appointment:   In Person  Provider:   You may see Sinclair Grooms, MD or one of the following Advanced Practice Providers on your designated Care Team:    Truitt Merle, NP  Cecilie Kicks, NP  Kathyrn Drown, NP   Other Instructions      Signed, Sinclair Grooms, MD  12/08/2018 12:59 PM    [place]

## 2018-12-08 ENCOUNTER — Other Ambulatory Visit: Payer: Self-pay

## 2018-12-08 ENCOUNTER — Encounter: Payer: Self-pay | Admitting: Interventional Cardiology

## 2018-12-08 ENCOUNTER — Ambulatory Visit (INDEPENDENT_AMBULATORY_CARE_PROVIDER_SITE_OTHER): Payer: Medicare Other | Admitting: Interventional Cardiology

## 2018-12-08 ENCOUNTER — Telehealth: Payer: Self-pay | Admitting: Interventional Cardiology

## 2018-12-08 VITALS — BP 142/76 | HR 67 | Ht 63.0 in | Wt 174.8 lb

## 2018-12-08 DIAGNOSIS — I2510 Atherosclerotic heart disease of native coronary artery without angina pectoris: Secondary | ICD-10-CM | POA: Diagnosis not present

## 2018-12-08 DIAGNOSIS — T466X5D Adverse effect of antihyperlipidemic and antiarteriosclerotic drugs, subsequent encounter: Secondary | ICD-10-CM

## 2018-12-08 DIAGNOSIS — D352 Benign neoplasm of pituitary gland: Secondary | ICD-10-CM

## 2018-12-08 DIAGNOSIS — I350 Nonrheumatic aortic (valve) stenosis: Secondary | ICD-10-CM

## 2018-12-08 DIAGNOSIS — E7849 Other hyperlipidemia: Secondary | ICD-10-CM

## 2018-12-08 DIAGNOSIS — I739 Peripheral vascular disease, unspecified: Secondary | ICD-10-CM

## 2018-12-08 DIAGNOSIS — I10 Essential (primary) hypertension: Secondary | ICD-10-CM | POA: Diagnosis not present

## 2018-12-08 DIAGNOSIS — Z7189 Other specified counseling: Secondary | ICD-10-CM

## 2018-12-08 DIAGNOSIS — G720 Drug-induced myopathy: Secondary | ICD-10-CM

## 2018-12-08 DIAGNOSIS — T466X5A Adverse effect of antihyperlipidemic and antiarteriosclerotic drugs, initial encounter: Secondary | ICD-10-CM

## 2018-12-08 NOTE — Telephone Encounter (Signed)
New Message   I received a call from Mr. Dorough's wife stating that she really appreciate Dr. Tamala Julian for all he has done for [facility]. She wanted to wish Dr. Tamala Julian and staff Happy Holidays and to stay safe.

## 2018-12-08 NOTE — Patient Instructions (Signed)

## 2018-12-14 ENCOUNTER — Other Ambulatory Visit: Payer: Self-pay

## 2018-12-14 NOTE — Patient Outreach (Signed)
[facility]) Care Management  12/14/2018  [place] 06/08/1945 [id number]   Medication Adherence call to Mr. Tavares Kiyabu Telephone call to Patient regarding Medication Adherence unable to reach patient. Mr. Berniece Salines is showing past due on Rosuvastatin 5 mg under [place].   [place] Management Direct Dial [phone]  Fax [phone] [email or web address]

## 2018-12-16 ENCOUNTER — Ambulatory Visit: Payer: Medicare Other | Admitting: Family Medicine

## 2019-01-04 ENCOUNTER — Telehealth: Payer: Self-pay | Admitting: *Deleted

## 2019-01-04 NOTE — Telephone Encounter (Signed)
Patient will call back to schedule ?

## 2019-01-31 ENCOUNTER — Other Ambulatory Visit: Payer: Self-pay | Admitting: *Deleted

## 2019-01-31 MED ORDER — METOPROLOL SUCCINATE ER 25 MG PO TB24: 25.0000 mg | ORAL_TABLET | Freq: Every day | ORAL | 3 refills | Status: DC

## 2019-03-08 ENCOUNTER — Ambulatory Visit (INDEPENDENT_AMBULATORY_CARE_PROVIDER_SITE_OTHER): Payer: Medicare Other | Admitting: Family Medicine

## 2019-03-08 VITALS — BP 142/76 | Ht 65.5 in | Wt 174.0 lb

## 2019-03-08 DIAGNOSIS — Z0000 Encounter for general adult medical examination without abnormal findings: Secondary | ICD-10-CM

## 2019-03-08 NOTE — Progress Notes (Signed)
Presents today for [employer] Visit   Date of last exam: 09/22/2018  Interpreter used for this visit? No  I connected with  [place] on 03/08/19 by a telephone and verified that I am speaking with the correct person using two identifiers.   I discussed the limitations of evaluation and management by telemedicine. The patient expressed understanding and agreed to proceed.   Patient Care Team: Rutherford Guys, MD as PCP - General (Family Medicine) Belva Crome, MD as PCP - Cardiology (Cardiology) Belva Crome, MD as Consulting Physician (Cardiology) Meyer Russel, MD as Referring Physician (Internal Medicine) Johnney Killian Luciano Cutter, MD as Consulting Physician (Neurosurgery) Wendee Beavers, Deidre Ala, MD as Referring Physician (Otolaryngology)   Other items to address today:   Discussed immunizations Discussed Eye/Dental Follow up Dr. Pamella Pert 2-18 @ 3:40 States will let know what doctor he wants to see for Colonoscopy  Will hold off on referral    Other Screening:  Last lipid screening:09-22-2018  ADVANCE DIRECTIVES: Discussed: yes On File: no Materials Provided: yes  Immunization status:  Immunization History  Administered Date(s) Administered  . Fluad Quad(high Dose 65+) 09/16/2018  . Influenza, High Dose Seasonal PF 04/05/2018  . Influenza-Unspecified 09/27/2012     Health Maintenance Due  Topic Date Due  . COLONOSCOPY  10/10/1997     Functional Status Survey: Is the patient deaf or have difficulty hearing?: No Does the patient have difficulty seeing, even when wearing glasses/contacts?: No Does the patient have difficulty concentrating, remembering, or making decisions?: No Does the patient have difficulty walking or climbing stairs?: No Does the patient have difficulty dressing or bathing?: No Does the patient have difficulty doing errands alone such as visiting a doctor's office or shopping?: No   6CIT Screen 03/08/2019  What  Year? 0 points  What month? 0 points  What time? 0 points  Count back from 20 0 points  Months in reverse 2 points  Repeat phrase 2 points  Total Score 4        Clinical Support from 03/08/2019 in Primary Care at [place]  AUDIT-C Score  2       Home Environment:    Lives in one story home  No scattered rugs Yes grab bars No trouble Adequate lighting/ no clutter     Patient Active Problem List   Diagnosis Date Noted  . Statin myopathy 09/01/2018  . Pituitary tumor 02/19/2018  . Deviated septum 08/17/2017  . Nasal turbinate hypertrophy 08/17/2017  . Pituitary adenoma ([place]) 08/17/2017  . Pain 10/24/2015  . Bilateral hand pain 04/20/2015  . Contracture of joint of right hand 04/20/2015  . Aortic stenosis 03/10/2014  . Left carotid bruit 03/10/2014  . Glaucoma   . Hypertension   . Enlarged prostate   . Coronary artery disease   . Peripheral vascular disease ([place])   . Dizziness   . Hyperlipidemia   . GERD (gastroesophageal reflux disease)   . Gout   . Exertional shortness of breath   . Iron deficiency anemia   . Migraine   . Arthritis   . S/P laparoscopic cholecystectomy 01/25/2013  . Thrombosed external hemorrhoid 03/25/2012     Past Medical History:  Diagnosis Date  . Arthritis    "hands, knees, elbows, fingers" (01/25/2013)  . Coronary artery disease    HX OF HEART STENTS X 2   2010--DR. H. SMITH IS PT'S CARDIOLOGIST, MILD AORTIC STENOSIS  . Dizziness   . Enlarged prostate  [place]  . Exertional shortness of breath   . GERD (gastroesophageal reflux disease)    OTC PREVACID AS NEEDED  . Glaucoma    BOTH EYES  . Gout   . Heart murmur   . Hyperlipidemia   . Hypertension   . Hypertension   . Iron deficiency anemia    hx  . Migraine    "monthly" (01/25/2013)  . Peripheral vascular disease ([place])    CLAUDICATION     Past Surgical History:  Procedure Laterality Date  . CHOLECYSTECTOMY N/A 01/25/2013   Procedure: LAPAROSCOPIC  CHOLECYSTECTOMY WITH INTRAOPERATIVE CHOLANGIOGRAM;  Surgeon: [name], MD;  Location: [place];  Service: General;  Laterality: N/A;  . CORONARY ANGIOPLASTY WITH STENT PLACEMENT  11/27/2008   "2" (01/25/2013)  . EUS  12/24/2011   Procedure: FULL UPPER ENDOSCOPIC ULTRASOUND (EUS) RADIAL;  Surgeon: [name], MD;  [facility];  Service: Endoscopy;  Laterality: N/A;  . INCISION AND DRAINAGE     thrombosed hemorrhoid  . LAPAROSCOPIC CHOLECYSTECTOMY  01/25/2013     Family History  Problem Relation Age of Onset  . Breast cancer Mother   . Cancer Mother   . Heart failure Mother   . Heart disease Father   . Heart attack Father      Social History   Socioeconomic History  . Marital status: Married    Spouse name: Not on file  . Number of children: Not on file  . Years of education: Not on file  . Highest education level: Not on file  Occupational History  . Not on file  Tobacco Use  . Smoking status: Never Smoker  . Smokeless tobacco: Never Used  Substance and Sexual Activity  . Alcohol use: No  . Drug use: No  . Sexual activity: Never  Other Topics Concern  . Not on file  Social History Narrative  . Not on file   Social Determinants of Health   Financial Resource Strain:   . Difficulty of Paying Living Expenses: Not on file  Food Insecurity:   . Worried About Charity fundraiser in the Last Year: Not on file  . Ran Out of Food in the Last Year: Not on file  Transportation Needs:   . Lack of Transportation (Medical): Not on file  . Lack of Transportation (Non-Medical): Not on file  Physical Activity:   . Days of Exercise per Week: Not on file  . Minutes of Exercise per Session: Not on file  Stress:   . Feeling of Stress : Not on file  Social Connections:   . Frequency of Communication with Friends and Family: Not on file  . Frequency of Social Gatherings with Friends and Family: Not on file  . Attends Religious Services: Not on file  . Active  Member of Clubs or Organizations: Not on file  . Attends Archivist Meetings: Not on file  . Marital Status: Not on file  Intimate Partner Violence:   . Fear of Current or Ex-Partner: Not on file  . Emotionally Abused: Not on file  . Physically Abused: Not on file  . Sexually Abused: Not on file     Allergies  Allergen Reactions  . Codeine Itching     Prior to Admission medications   Medication Sig Start Date End Date Taking? Authorizing Provider  acetaminophen (TYLENOL) 500 MG tablet Take 1,500 mg by mouth every 6 (six) hours as needed for moderate pain.   Yes [provider]  aspirin 81 MG tablet Take 81 mg by mouth daily.   Yes [provider]  carboxymethylcellulose (REFRESH PLUS) 0.5 % SOLN 1 drop 2 (two) times daily as needed.   Yes [provider]  CETIRIZINE HCL PO Take by mouth.   Yes [provider]  clobetasol (TEMOVATE) 0.05 % external solution Apply 1 application topically 2 (two) times daily. 09/16/18  Yes Rutherford Guys, MD  Evolocumab (REPATHA SURECLICK) [id number] MG/ML SOAJ Inject 1 pen into the skin every 14 (fourteen) days. 09/01/18  Yes Belva Crome, MD  fluticasone [facility]) 50 MCG/ACT nasal spray Place 2 sprays into both nostrils daily. 09/16/18  Yes Rutherford Guys, MD  hydrocortisone (CORTEF) 10 MG tablet Take 1 tablet (10 mg total) by mouth every morning AND 0.5 tablets (5 mg total) every evening. 04/05/18  Yes Rutherford Guys, MD  metoprolol succinate (TOPROL-XL) 25 MG 24 hr tablet Take 1 tablet (25 mg total) by mouth daily. 01/31/19  Yes Belva Crome, MD  Multiple Vitamin (MULTIVITAMIN) tablet Take 1 tablet by mouth daily.   Yes [provider]  tamsulosin (FLOMAX) 0.4 MG CAPS capsule Take 0.8 mg by mouth at bedtime.  02/24/14  Yes [provider]  testosterone cypionate (DEPOTESTOSTERONE CYPIONATE) 200 MG/ML injection Inject 1 mL (200 mg total) into the muscle once a week. 04/05/18  Yes Rutherford Guys,  MD  travoprost, benzalkonium, (TRAVATAN) 0.004 % ophthalmic solution Place 1 drop into both eyes at bedtime.    Yes [provider]  TURMERIC PO Take 2 capsules by mouth daily.   Yes [provider]  zolpidem (AMBIEN) 10 MG tablet Take 1 tablet (10 mg total) by mouth at bedtime as needed for sleep. 09/16/18  Yes Rutherford Guys, MD  nitroGLYCERIN (NITROSTAT) 0.4 MG SL tablet Place 1 tablet (0.4 mg total) under the tongue every 5 (five) minutes as needed for chest pain. Please keep upcoming appt in November. Thanks 12/01/18 03/01/19  Belva Crome, MD     Depression screen [facility] 2/9 03/08/2019 09/16/2018 01/01/2018 11/05/2017 09/16/2017  Decreased Interest 0 0 0 - 0  Down, Depressed, Hopeless 0 0 0 0 0  PHQ - 2 Score 0 0 0 0 0     Fall Risk  03/08/2019 09/16/2018 04/05/2018 01/01/2018 11/05/2017  Falls in the past year? 0 0 0 0 No  Number falls in past yr: 0 0 0 - -  Injury with Fall? 0 0 0 - -  Follow up Falls evaluation completed;Education provided - - - -      PHYSICAL EXAM: BP (!) 142/76 Comment: taken from a previous visit  Ht 5' 5.5" (1.664 m)   Wt 174 lb (78.9 kg)   BMI 28.51 kg/m    Wt Readings from Last 3 Encounters:  03/08/19 174 lb (78.9 kg)  12/08/18 174 lb 12.8 oz (79.3 kg)  09/16/18 173 lb (78.5 kg)    Medicare annual wellness visit, subsequent    Education/Counseling provided regarding diet and exercise, prevention of chronic diseases, smoking/tobacco cessation, if applicable, and reviewed "Covered Medicare Preventive Services."

## 2019-03-08 NOTE — Patient Instructions (Addendum)
Thank you for taking time to come for your Medicare Wellness Visit. I appreciate your ongoing commitment to your health goals. Please review the following plan we discussed and let me know if I can assist you in the future.  Leroy Kennedy LPN  Preventive Care 72 Years and Older, Male Preventive care refers to lifestyle choices and visits with your health care provider that can promote health and wellness. This includes:  A yearly physical exam. This is also called an annual well check.  Regular dental and eye exams.  Immunizations.  Screening for certain conditions.  Healthy lifestyle choices, such as diet and exercise. What can I expect for my preventive care visit? Physical exam Your health care provider will check:  Height and weight. These may be used to calculate body mass index (BMI), which is a measurement that tells if you are at a healthy weight.  Heart rate and blood pressure.  Your skin for abnormal spots. Counseling Your health care provider may ask you questions about:  Alcohol, tobacco, and drug use.  Emotional well-being.  Home and relationship well-being.  Sexual activity.  Eating habits.  History of falls.  Memory and ability to understand (cognition).  Work and work Statistician. What immunizations do I need?  Influenza (flu) vaccine  This is recommended every year. Tetanus, diphtheria, and pertussis (Tdap) vaccine  You may need a Td booster every 10 years. Varicella (chickenpox) vaccine  You may need this vaccine if you have not already been vaccinated. Zoster (shingles) vaccine  You may need this after age 66. Pneumococcal conjugate (PCV13) vaccine  One dose is recommended after age 84. Pneumococcal polysaccharide (PPSV23) vaccine  One dose is recommended after age 88. Measles, mumps, and rubella (MMR) vaccine  You may need at least one dose of MMR if you were born in 1957 or later. You may also need a second dose. Meningococcal  conjugate (MenACWY) vaccine  You may need this if you have certain conditions. Hepatitis A vaccine  You may need this if you have certain conditions or if you travel or work in places where you may be exposed to hepatitis A. Hepatitis B vaccine  You may need this if you have certain conditions or if you travel or work in places where you may be exposed to hepatitis B. Haemophilus influenzae type b (Hib) vaccine  You may need this if you have certain conditions. You may receive vaccines as individual doses or as more than one vaccine together in one shot (combination vaccines). Talk with your health care provider about the risks and benefits of combination vaccines. What tests do I need? Blood tests  Lipid and cholesterol levels. These may be checked every 5 years, or more frequently depending on your overall health.  Hepatitis C test.  Hepatitis B test. Screening  Lung cancer screening. You may have this screening every year starting at age 24 if you have a 30-pack-year history of smoking and currently smoke or have quit within the past 15 years.  Colorectal cancer screening. All adults should have this screening starting at age 52 and continuing until age 61. Your health care provider may recommend screening at age 57 if you are at increased risk. You will have tests every 1-10 years, depending on your results and the type of screening test.  Prostate cancer screening. Recommendations will vary depending on your family history and other risks.  Diabetes screening. This is done by checking your blood sugar (glucose) after you have not eaten for  a while (fasting). You may have this done every 1-3 years.  Abdominal aortic aneurysm (AAA) screening. You may need this if you are a current or former smoker.  Sexually transmitted disease (STD) testing. Follow these instructions at home: Eating and drinking  Eat a diet that includes fresh fruits and vegetables, whole grains, lean  protein, and low-fat dairy products. Limit your intake of foods with high amounts of sugar, saturated fats, and salt.  Take vitamin and mineral supplements as recommended by your health care provider.  Do not drink alcohol if your health care provider tells you not to drink.  If you drink alcohol: ? Limit how much you have to 0-2 drinks a day. ? Be aware of how much alcohol is in your drink. In the [place] one drink equals one 12 oz bottle of beer (355 mL), one 5 oz glass of wine (148 mL), or one 1 oz glass of hard liquor (44 mL). Lifestyle  Take daily care of your teeth and gums.  Stay active. Exercise for at least 30 minutes on 5 or more days each week.  Do not use any products that contain nicotine or tobacco, such as cigarettes, e-cigarettes, and chewing tobacco. If you need help quitting, ask your health care provider.  If you are sexually active, practice safe sex. Use a condom or other form of protection to prevent STIs (sexually transmitted infections).  Talk with your health care provider about taking a low-dose aspirin or statin. What's next?  Visit your health care provider once a year for a well check visit.  Ask your health care provider how often you should have your eyes and teeth checked.  Stay up to date on all vaccines. This information is not intended to replace advice given to you by your health care provider. Make sure you discuss any questions you have with your health care provider. Document Revised: 01/07/2018 Document Reviewed: 01/07/2018 Elsevier Patient Education  2020 Elsevier Inc.  

## 2019-03-17 ENCOUNTER — Telehealth: Payer: Medicare Other | Admitting: Family Medicine

## 2019-03-17 ENCOUNTER — Other Ambulatory Visit: Payer: Self-pay

## 2019-04-04 ENCOUNTER — Other Ambulatory Visit: Payer: Self-pay

## 2019-04-04 ENCOUNTER — Telehealth (INDEPENDENT_AMBULATORY_CARE_PROVIDER_SITE_OTHER): Payer: Medicare Other | Admitting: Family Medicine

## 2019-04-04 DIAGNOSIS — G4762 Sleep related leg cramps: Secondary | ICD-10-CM

## 2019-04-04 DIAGNOSIS — N1831 Chronic kidney disease, stage 3a: Secondary | ICD-10-CM | POA: Diagnosis not present

## 2019-04-04 DIAGNOSIS — M791 Myalgia, unspecified site: Secondary | ICD-10-CM | POA: Diagnosis not present

## 2019-04-04 NOTE — Progress Notes (Signed)
Pt is having pain in the cramping and feet. This has been going on for more than 3 months now. He is having to sleep with heating pad nightly to help with cramping. Denies falling or wearing nonsupport shoes. Also says feel stay really cold

## 2019-04-04 NOTE — Progress Notes (Signed)
Virtual Visit Note  I connected with patient on 04/04/19 at 626pm by phone and verified that I am speaking with the correct person using two identifiers. Robert Estes is currently located at home and patient is currently with them during visit. The provider, Rutherford Guys, MD is located in their office at time of visit.  I discussed the limitations, risks, security and privacy concerns of performing an evaluation and management service by telephone and the availability of in person appointments. I also discussed with the patient that there may be a patient responsible charge related to this service. The patient expressed understanding and agreed to proceed.   I provided 14 minutes of non-face-to-face time during this encounter.  CC: muscle cramps  HPI ? PMH: HTN, CAD s/p stenting, PVD, BPH, mild AS, GERD, pituitary adenoma, insomnia   Having 3- 4 months of having cramping in calves and feet Most of the time they are at night when he go to bed, sleeping with a heating pad helps with cramps but not with the muscle pain Stretching his legs also will cause it There is no pain with walking Since changed from statin to repatha cramping sign better, went from all body to just the legs and feet Drinks about 3 bottles of water a day  Allergies  Allergen Reactions  . Codeine Itching    Prior to Admission medications   Medication Sig Start Date End Date Taking? Authorizing Provider  acetaminophen (TYLENOL) 500 MG tablet Take 1,500 mg by mouth every 6 (six) hours as needed for moderate pain.   Yes [provider]  aspirin 81 MG tablet Take 81 mg by mouth daily.   Yes [provider]  CETIRIZINE HCL PO Take by mouth.   Yes [provider]  clobetasol (TEMOVATE) 0.05 % external solution Apply 1 application topically 2 (two) times daily. 09/16/18  Yes Rutherford Guys, MD  Evolocumab (REPATHA SURECLICK) XX123456 MG/ML SOAJ Inject 1 pen into the skin every 14  (fourteen) days. 09/01/18  Yes Belva Crome, MD  metoprolol succinate (TOPROL-XL) 25 MG 24 hr tablet Take 1 tablet (25 mg total) by mouth daily. 01/31/19  Yes Belva Crome, MD  Multiple Vitamin (MULTIVITAMIN) tablet Take 1 tablet by mouth daily.   Yes [provider]  tamsulosin (FLOMAX) 0.4 MG CAPS capsule Take 0.8 mg by mouth at bedtime.  02/24/14  Yes [provider]  testosterone cypionate (DEPOTESTOSTERONE CYPIONATE) 200 MG/ML injection Inject 1 mL (200 mg total) into the muscle once a week. 04/05/18  Yes Rutherford Guys, MD  travoprost, benzalkonium, (TRAVATAN) 0.004 % ophthalmic solution Place 1 drop into both eyes at bedtime.    Yes [provider]  TURMERIC PO Take 2 capsules by mouth daily.   Yes [provider]  zolpidem (AMBIEN) 10 MG tablet Take 1 tablet (10 mg total) by mouth at bedtime as needed for sleep. 09/16/18  Yes Rutherford Guys, MD  hydrocortisone (CORTEF) 10 MG tablet Take 1 tablet (10 mg total) by mouth every morning AND 0.5 tablets (5 mg total) every evening. Patient not taking: Reported on 04/04/2019 04/05/18   Rutherford Guys, MD  nitroGLYCERIN (NITROSTAT) 0.4 MG SL tablet Place 1 tablet (0.4 mg total) under the tongue every 5 (five) minutes as needed for chest pain. Please keep upcoming appt in November. Thanks 12/01/18 03/01/19  Belva Crome, MD    Past Medical History:  Diagnosis Date  . Arthritis    "hands,  knees, elbows, fingers" (01/25/2013)  . Coronary artery disease    HX OF HEART STENTS X 2   2010--DR. H. SMITH IS PT'S CARDIOLOGIST, MILD AORTIC STENOSIS  . Dizziness   . Enlarged prostate    FLOMAX HELPS PT VOID  . Exertional shortness of breath   . GERD (gastroesophageal reflux disease)    OTC PREVACID AS NEEDED  . Glaucoma    BOTH EYES  . Gout   . Heart murmur   . Hyperlipidemia   . Hypertension   . Hypertension   . Iron deficiency anemia    hx  . Migraine    "monthly" (01/25/2013)  . Peripheral vascular disease  ([place])    CLAUDICATION    Past Surgical History:  Procedure Laterality Date  . CHOLECYSTECTOMY N/A 01/25/2013   Procedure: LAPAROSCOPIC CHOLECYSTECTOMY WITH INTRAOPERATIVE CHOLANGIOGRAM;  Surgeon: [name], MD;  Location: [facility];  Service: General;  Laterality: N/A;  . CORONARY ANGIOPLASTY WITH STENT PLACEMENT  11/27/2008   "2" (01/25/2013)  . EUS  12/24/2011   Procedure: FULL UPPER ENDOSCOPIC ULTRASOUND (EUS) RADIAL;  Surgeon: [name], MD;  Location: [facility];  Service: Endoscopy;  Laterality: N/A;  . INCISION AND DRAINAGE     thrombosed hemorrhoid  . LAPAROSCOPIC CHOLECYSTECTOMY  01/25/2013    Social History   Tobacco Use  . Smoking status: Never Smoker  . Smokeless tobacco: Never Used  Substance Use Topics  . Alcohol use: No    Family History  Problem Relation Age of Onset  . Breast cancer Mother   . Cancer Mother   . Heart failure Mother   . Heart disease Father   . Heart attack Father     ROS Per hpi  Objective  Vitals as reported by the patient: none   ASSESSMENT and PLAN  1. Nocturnal leg cramps Discussed supportive measures: increase water intake, gentle stretching, labs to r/o electrolyte deficiency.  - Basic Metabolic Panel; Future - Magnesium; Future  2. Stage 3a chronic kidney disease - CBC; Future  3. Myalgia - CK; Future  FOLLOW-UP: 2 weeks   The above assessment and management plan was discussed with the patient. The patient verbalized understanding of and has agreed to the management plan. Patient is aware to call the clinic if symptoms persist or worsen. Patient is aware when to return to the clinic for a follow-up visit. Patient educated on when it is appropriate to go to the emergency department.     Rutherford Guys, MD Primary Care at [facility] [place], [place] [address] Ph.  [phone] Fax [phone]

## 2019-04-15 ENCOUNTER — Telehealth: Payer: Self-pay | Admitting: Interventional Cardiology

## 2019-04-15 NOTE — Telephone Encounter (Signed)
Talked to wife Robert Estes). Repatha 140mg  dose should be given every 14 days.   Ann understood instructions and appreciate rapid response.  No further questions at this time.

## 2019-04-15 NOTE — Telephone Encounter (Signed)
Patient's wife Lelon Frohlich states she is calling to inquire about how often the patient needs to take Evolocumab (REPATHA SURECLICK) [id number] MG/ML SOAJ medication. Please advise.

## 2019-04-22 ENCOUNTER — Other Ambulatory Visit: Payer: Self-pay | Admitting: Family Medicine

## 2019-04-22 DIAGNOSIS — G4700 Insomnia, unspecified: Secondary | ICD-10-CM

## 2019-04-22 NOTE — Telephone Encounter (Signed)
pmp reviewd, appropriate meds refilled 

## 2019-04-22 NOTE — Telephone Encounter (Signed)
Requested  medications are  due for refill today yes  Requested medications are on the active medication list yes  Last refill 2/15  Last visit: 2/9  Future visit scheduled no  Notes to clinic Not Delegated

## 2019-05-25 DIAGNOSIS — H401131 Primary open-angle glaucoma, bilateral, mild stage: Secondary | ICD-10-CM | POA: Diagnosis not present

## 2019-07-20 DIAGNOSIS — H401131 Primary open-angle glaucoma, bilateral, mild stage: Secondary | ICD-10-CM | POA: Diagnosis not present

## 2019-08-10 MED ORDER — REPATHA SURECLICK 140 MG/ML ~~LOC~~ SOAJ: 1.0000 "pen " | SUBCUTANEOUS | 11 refills | Status: DC

## 2019-08-10 NOTE — Telephone Encounter (Signed)
Patient's wife, Lelon Frohlich, is calling to follow up in regards to Evolocumab (REPATHA SURECLICK) 158 MG/ML SOAJ medication. She states the patient has always been able to get the medication for free. However, the last time the patient attempted to to purchase the medication refill, the price was $47.. Lelon Frohlich is inquiring about whether or not the patient is eligible for a coupon to assist with purchasing. Please advise.

## 2019-08-10 NOTE — Telephone Encounter (Signed)
Pt was previously enrolled in [employer] which looks as though it has expired. Called pt to obtain updated annual income, resubmitted grant which was approved. New ID sent to pharmacy, confirmed $0 copay. Pt was appreciative of help.

## 2019-08-10 NOTE — Addendum Note (Signed)
Addended by: Tiondra Fang E on: 08/10/2019 11:39 AM   Modules accepted: Orders

## 2019-08-22 ENCOUNTER — Other Ambulatory Visit: Payer: Self-pay

## 2019-08-22 ENCOUNTER — Telehealth: Payer: Self-pay | Admitting: Interventional Cardiology

## 2019-08-22 ENCOUNTER — Ambulatory Visit (INDEPENDENT_AMBULATORY_CARE_PROVIDER_SITE_OTHER): Payer: Medicare Other | Admitting: Family Medicine

## 2019-08-22 ENCOUNTER — Encounter: Payer: Self-pay | Admitting: Family Medicine

## 2019-08-22 VITALS — BP 127/83 | HR 84 | Temp 97.9°F | Ht 65.5 in | Wt 175.0 lb

## 2019-08-22 DIAGNOSIS — R29898 Other symptoms and signs involving the musculoskeletal system: Secondary | ICD-10-CM | POA: Diagnosis not present

## 2019-08-22 DIAGNOSIS — J0140 Acute pansinusitis, unspecified: Secondary | ICD-10-CM | POA: Diagnosis not present

## 2019-08-22 DIAGNOSIS — E7849 Other hyperlipidemia: Secondary | ICD-10-CM

## 2019-08-22 LAB — LIPID PANEL

## 2019-08-22 MED ORDER — AMOXICILLIN-POT CLAVULANATE 875-125 MG PO TABS: 1.0000 | ORAL_TABLET | Freq: Two times a day (BID) | ORAL | 0 refills | Status: DC

## 2019-08-22 NOTE — Telephone Encounter (Signed)
Pt's wife called for the pt and he wanted to thank the nurse for the callback. It was a sigh of release. Wanted to give current metrics  BP: 127/83  Temp: 97.9 F (36.6 C)  Pulse Rate: 84 SpO2: 97 %

## 2019-08-22 NOTE — Telephone Encounter (Signed)
  Robert Estes went to Urgent Care this morning and they wanted to do a CT scan because he was having left sided weakness in his lower extremity. He states his left knee and leg is weak and he wants to know if he should have the CT or come see Dr Tamala Julian. He did not have the CT yet but does have an appt with [facility] soon.

## 2019-08-22 NOTE — Telephone Encounter (Signed)
Pt seen at urgent care this morning for left leg weakness.  Was at work at felt a "heat wave" and didn't feel like he could stand.  Was able to get to the office at work and sit down. Drank water and began to feel better.  Tried to stand and was wobbly on the left side.  Went home and took some meds for vertigo but was still having issues walking, so ended up at [facility]  They ordered a Head CT STAT which is scheduled for tomorrow morning.  Advised pt to keep the CT appt and reminded him to go to ER if any changes.  Advised I will update Dr.Smith.

## 2019-08-22 NOTE — Patient Instructions (Signed)
° ° ° °  If you have lab work done today you will be contacted with your lab results within the next 2 weeks.  If you have not heard from us then please contact us. The fastest way to get your results is to register for My Chart. ° ° °IF you received an x-ray today, you will receive an invoice from [facility]. Please contact [facility] at [phone] with questions or concerns regarding your invoice.  ° °IF you received labwork today, you will receive an invoice from [facility]. Please contact [facility] at [phone] with questions or concerns regarding your invoice.  ° °Our billing staff will not be able to assist you with questions regarding bills from these companies. ° °You will be contacted with the lab results as soon as they are available. The fastest way to get your results is to activate your My Chart account. Instructions are located on the last page of this paperwork. If you have not heard from us regarding the results in 2 weeks, please contact this office. °  ° ° ° °

## 2019-08-22 NOTE — Progress Notes (Signed)
7/26/20219:24 AM  Williams 1945/03/17, 74 y.o., male [id number]  Chief Complaint  Patient presents with  . Dizziness    headache, hearing ocean, sinuses , L side feels weaker started about a week ago    HPI:   Patient is a 74 y.o. male with past medical history significant for HTN, CADs/p stenting,PVD,BPH,mildAS, GERD, pituitary adenoma, insomnia who presents today for dizziness  3 days ago woke up with sudden ringing in ear/pressure, sense of unbalance, sways towards the left, no loss of function or sensation "felt like I was a having a stroke" Reports left leg feels weak, no numbness or tingling Did not go into ER as his wife gets very nervous around hospitals Having nasal congestion, hoarseness, sinus pressure x 1 month, flonase not helping No changes in vision or speech No fever, chills occ cough, no SOB no wheezing Has been wo testosterone injections for over a month Uses cane for chronic back problems Takes ASA 81mg  once a day Cards Dr Daneen Schick  Depression screen [facility] 2/9 08/22/2019 04/04/2019 03/08/2019  Decreased Interest 0 0 0  Down, Depressed, Hopeless 0 0 0  PHQ - 2 Score 0 0 0    Fall Risk  08/22/2019 04/04/2019 03/08/2019 09/16/2018 04/05/2018  Falls in the past year? 0 0 0 0 0  Number falls in past yr: 0 0 0 0 0  Injury with Fall? 0 0 0 0 0  Follow up Falls evaluation completed - Falls evaluation completed;Education provided - -     Allergies  Allergen Reactions  . Codeine Itching    Prior to Admission medications   Medication Sig Start Date End Date Taking? Authorizing Provider  acetaminophen (TYLENOL) 500 MG tablet Take 1,500 mg by mouth every 6 (six) hours as needed for moderate pain.   Yes [provider]  aspirin 81 MG tablet Take 81 mg by mouth daily.   Yes [provider]  CETIRIZINE HCL PO Take by mouth.   Yes [provider]  clobetasol (TEMOVATE) 0.05 % external solution Apply 1 application topically 2 (two)  times daily. 09/16/18  Yes Rutherford Guys, MD  Evolocumab (REPATHA SURECLICK) 242 MG/ML SOAJ Inject 1 pen into the skin every 14 (fourteen) days. 08/10/19  Yes Belva Crome, MD  hydrocortisone (CORTEF) 10 MG tablet Take 1 tablet (10 mg total) by mouth every morning AND 0.5 tablets (5 mg total) every evening. 04/05/18  Yes Rutherford Guys, MD  metoprolol succinate (TOPROL-XL) 25 MG 24 hr tablet Take 1 tablet (25 mg total) by mouth daily. 01/31/19  Yes Belva Crome, MD  Multiple Vitamin (MULTIVITAMIN) tablet Take 1 tablet by mouth daily.   Yes [provider]  tamsulosin (FLOMAX) 0.4 MG CAPS capsule Take 0.8 mg by mouth at bedtime.  02/24/14  Yes [provider]  travoprost, benzalkonium, (TRAVATAN) 0.004 % ophthalmic solution Place 1 drop into both eyes at bedtime.    Yes [provider]  TURMERIC PO Take 2 capsules by mouth daily.   Yes [provider]  zolpidem (AMBIEN) 10 MG tablet TAKE 1 TABLET(10 MG) BY MOUTH AT BEDTIME AS NEEDED FOR SLEEP 04/22/19  Yes Rutherford Guys, MD  nitroGLYCERIN (NITROSTAT) 0.4 MG SL tablet Place 1 tablet (0.4 mg total) under the tongue every 5 (five) minutes as needed for chest pain. Please keep upcoming appt in November. Thanks 12/01/18 03/01/19  Belva Crome, MD  testosterone cypionate (DEPOTESTOSTERONE CYPIONATE) 200 MG/ML injection Inject 1 mL (200  mg total) into the muscle once a week. Patient not taking: Reported on 08/22/2019 04/05/18   Rutherford Guys, MD    Past Medical History:  Diagnosis Date  . Arthritis    "hands, knees, elbows, fingers" (01/25/2013)  . Coronary artery disease    HX OF HEART STENTS X 2   2010--DR. H. SMITH IS PT'S CARDIOLOGIST, MILD AORTIC STENOSIS  . Dizziness   . Enlarged prostate    FLOMAX HELPS PT VOID  . Exertional shortness of breath   . GERD (gastroesophageal reflux disease)    OTC PREVACID AS NEEDED  . Glaucoma    BOTH EYES  . Gout   . Heart murmur   . Hyperlipidemia   . Hypertension    . Hypertension   . Iron deficiency anemia    hx  . Migraine    "monthly" (01/25/2013)  . Peripheral vascular disease ([place])    CLAUDICATION    Past Surgical History:  Procedure Laterality Date  . CHOLECYSTECTOMY N/A 01/25/2013   Procedure: LAPAROSCOPIC CHOLECYSTECTOMY WITH INTRAOPERATIVE CHOLANGIOGRAM;  Surgeon: [name], MD;  Location: [facility];  Service: General;  Laterality: N/A;  . CORONARY ANGIOPLASTY WITH STENT PLACEMENT  11/27/2008   "2" (01/25/2013)  . EUS  12/24/2011   Procedure: FULL UPPER ENDOSCOPIC ULTRASOUND (EUS) RADIAL;  Surgeon: [name], MD;  [facility];  Service: Endoscopy;  Laterality: N/A;  . INCISION AND DRAINAGE     thrombosed hemorrhoid  . LAPAROSCOPIC CHOLECYSTECTOMY  01/25/2013    Social History   Tobacco Use  . Smoking status: Never Smoker  . Smokeless tobacco: Never Used  Substance Use Topics  . Alcohol use: No    Family History  Problem Relation Age of Onset  . Breast cancer Mother   . Cancer Mother   . Heart failure Mother   . Heart disease Father   . Heart attack Father     ROS Per hpi  OBJECTIVE:  Today's Vitals   08/22/19 0917  BP: 127/83  Pulse: 84  Temp: 97.9 F (36.6 C)  SpO2: 97%  Weight: 175 lb (79.4 kg)  Height: 5' 5.5" (1.664 m)   Body mass index is 28.68 kg/m.   Physical Exam Vitals and nursing note reviewed.  Constitutional:      Appearance: He is well-developed.  HENT:     Head: Normocephalic and atraumatic.     Right Ear: Hearing, tympanic membrane, ear canal and external ear normal.     Left Ear: Hearing, tympanic membrane, ear canal and external ear normal.     Nose:     Right Sinus: Maxillary sinus tenderness and frontal sinus tenderness present.     Mouth/Throat:     Pharynx: No oropharyngeal exudate.  Eyes:     Extraocular Movements: Extraocular movements intact.     Conjunctiva/sclera: Conjunctivae normal.     Pupils: Pupils are equal, round, and reactive to light.   Cardiovascular:     Rate and Rhythm: Normal rate and regular rhythm.     Heart sounds: Murmur heard.  No friction rub. No gallop.   Pulmonary:     Effort: Pulmonary effort is normal.     Breath sounds: Normal breath sounds. No wheezing, rhonchi or rales.  Musculoskeletal:     Cervical back: Neck supple.  Lymphadenopathy:     Cervical: No cervical adenopathy.  Skin:    General: Skin is warm and dry.  Neurological:     Mental Status: He is alert and oriented to person, place,  and time.     Cranial Nerves: Cranial nerves are intact.     Sensory: Sensation is intact.     Motor: Weakness (4/5 left hip flexor) present. No abnormal muscle tone or pronator drift.     Coordination: Coordination is intact. Romberg sign negative. Heel to [facility] Test normal. Rapid alternating movements normal.     Gait: Gait abnormal.     Deep Tendon Reflexes: Reflexes are normal and symmetric.     No results found for this or any previous visit (from the past 24 hour(s)).  No results found.   ASSESSMENT and PLAN  1. Left leg weakness Acute onset in patient with ASCVD disease. Concerning for CVA, CT scan ordered stat. Outside treatment window. ER precautions given. Could also be related to lumbar DDD whoever acute onset makes this less likely.  - CBC - Comprehensive metabolic panel - Lipid panel - TSH - Vitamin B12 - CT HEAD WO CONTRAST; Future  2. Acute non-recurrent pansinusitis rx for augmentin given. Cont flonase  3. Other hyperlipidemia On repatha, managed by cards, labs pending  Other orders - amoxicillin-clavulanate (AUGMENTIN) 875-125 MG tablet; Take 1 tablet by mouth 2 (two) times daily.  Return in about 1 week (around 08/29/2019).    Rutherford Guys, MD Primary Care at [place], [place] [address] Ph.  [phone] Fax [phone]

## 2019-08-23 ENCOUNTER — Telehealth: Payer: Self-pay | Admitting: Family Medicine

## 2019-08-23 ENCOUNTER — Ambulatory Visit
Admission: RE | Admit: 2019-08-23 | Discharge: 2019-08-23 | Disposition: A | Discharge status: Home / Self Care | Payer: Medicare Other | Source: Ambulatory Visit | Attending: Family Medicine | Admitting: Family Medicine

## 2019-08-23 DIAGNOSIS — I7090 Unspecified atherosclerosis: Secondary | ICD-10-CM | POA: Diagnosis not present

## 2019-08-23 DIAGNOSIS — R29898 Other symptoms and signs involving the musculoskeletal system: Secondary | ICD-10-CM

## 2019-08-23 DIAGNOSIS — R29818 Other symptoms and signs involving the nervous system: Secondary | ICD-10-CM | POA: Diagnosis not present

## 2019-08-23 DIAGNOSIS — J0120 Acute ethmoidal sinusitis, unspecified: Secondary | ICD-10-CM | POA: Diagnosis not present

## 2019-08-23 DIAGNOSIS — J341 Cyst and mucocele of nose and nasal sinus: Secondary | ICD-10-CM | POA: Diagnosis not present

## 2019-08-23 LAB — COMPREHENSIVE METABOLIC PANEL
ALT: 45 IU/L — ABNORMAL HIGH (ref 0–44)
AST: 30 IU/L (ref 0–40)
Albumin/Globulin Ratio: 1.7 (ref 1.2–2.2)
Albumin: 4.3 g/dL (ref 3.7–4.7)
Alkaline Phosphatase: 91 IU/L (ref 48–121)
BUN/Creatinine Ratio: 9 — ABNORMAL LOW (ref 10–24)
BUN: 14 mg/dL (ref 8–27)
Bilirubin Total: 0.2 mg/dL (ref 0.0–1.2)
CO2: 24 mmol/L (ref 20–29)
Calcium: 9.6 mg/dL (ref 8.6–10.2)
Chloride: 103 mmol/L (ref 96–106)
Creatinine, Ser: 1.57 mg/dL — ABNORMAL HIGH (ref 0.76–1.27)
GFR calc Af Amer: 51 mL/min/{1.73_m2} — ABNORMAL LOW (ref >59)
GFR calc non Af Amer: 44 mL/min/{1.73_m2} — ABNORMAL LOW (ref >59)
Globulin, Total: 2.5 g/dL (ref 1.5–4.5)
Glucose: 97 mg/dL (ref 65–99)
Potassium: 4.8 mmol/L (ref 3.5–5.2)
Sodium: 142 mmol/L (ref 134–144)
Total Protein: 6.8 g/dL (ref 6.0–8.5)

## 2019-08-23 LAB — CBC
Hematocrit: 49.3 % (ref 37.5–51.0)
Hemoglobin: 16.5 g/dL (ref 13.0–17.7)
MCH: 27.3 pg (ref 26.6–33.0)
MCHC: 33.5 g/dL (ref 31.5–35.7)
MCV: 82 fL (ref 79–97)
Platelets: 185 10*3/uL (ref 150–450)
RBC: 6.04 x10E6/uL — ABNORMAL HIGH (ref 4.14–5.80)
RDW: 19.2 % — ABNORMAL HIGH (ref 11.6–15.4)
WBC: 4.7 10*3/uL (ref 3.4–10.8)

## 2019-08-23 LAB — VITAMIN B12: Vitamin B-12: 1097 pg/mL (ref 232–1245)

## 2019-08-23 LAB — TSH: TSH: 1.8 u[IU]/mL (ref 0.450–4.500)

## 2019-08-23 LAB — LIPID PANEL
Chol/HDL Ratio: 3.1 ratio (ref 0.0–5.0)
Cholesterol, Total: 119 mg/dL (ref 100–199)
HDL: 38 mg/dL — ABNORMAL LOW (ref >39)
LDL Chol Calc (NIH): 45 mg/dL (ref 0–99)
Triglycerides: 225 mg/dL — ABNORMAL HIGH (ref 0–149)
VLDL Cholesterol Cal: 36 mg/dL (ref 5–40)

## 2019-08-23 NOTE — Telephone Encounter (Signed)
No obvious acute stroke. If still having unstable gait, needs to see PCP

## 2019-08-23 NOTE — Telephone Encounter (Signed)
Copied from [place] [id number]. Topic: Compliment - Provider (non-sensitive) >> Aug 23, 2019 12:38 PM Greggory Keen D wrote: Date of encounter: [id number] Details of compliment: Pt's caregiver just wanted to thank her for the care yesteday Who would the patient like to thank/see rewarded?  On a scale of 1-10, how was your experience?   Route to Engineer, building services.

## 2019-08-23 NOTE — Telephone Encounter (Signed)
Guadelupe Sabin was sent a Cheer[employer] for her excellent patient care.

## 2019-08-24 NOTE — Telephone Encounter (Signed)
Spoke with pt and made him aware of recommendations.  Pt appreciative for call.  

## 2019-08-25 ENCOUNTER — Telehealth: Payer: Self-pay | Admitting: Interventional Cardiology

## 2019-08-25 ENCOUNTER — Telehealth: Payer: Self-pay | Admitting: Family Medicine

## 2019-08-25 NOTE — Telephone Encounter (Signed)
Per call from pt's wife stated:  She wanted to thank Anderson Malta for the call yesterday from the bottom of her Heart.  She stated it really made a difference. She would like to also [employer] well in all things.  Again THANK YOU!

## 2019-08-25 NOTE — Telephone Encounter (Signed)
Patients wife is calling to let provider know that patient completed all testing as orderd. And she will call back to make follow up

## 2019-08-29 ENCOUNTER — Ambulatory Visit: Payer: Medicare Other | Admitting: Family Medicine

## 2019-09-12 ENCOUNTER — Other Ambulatory Visit: Payer: Self-pay | Admitting: Pharmacist

## 2019-09-12 MED ORDER — REPATHA SURECLICK 140 MG/ML ~~LOC~~ SOAJ: 1.0000 "pen " | SUBCUTANEOUS | 11 refills | Status: DC

## 2019-09-13 ENCOUNTER — Encounter: Payer: Self-pay | Admitting: Radiology

## 2019-09-22 ENCOUNTER — Other Ambulatory Visit: Payer: Self-pay

## 2019-09-22 ENCOUNTER — Telehealth (INDEPENDENT_AMBULATORY_CARE_PROVIDER_SITE_OTHER): Payer: Medicare Other | Admitting: Family Medicine

## 2019-09-22 DIAGNOSIS — R29898 Other symptoms and signs involving the musculoskeletal system: Secondary | ICD-10-CM

## 2019-09-22 DIAGNOSIS — R269 Unspecified abnormalities of gait and mobility: Secondary | ICD-10-CM

## 2019-09-22 DIAGNOSIS — E7849 Other hyperlipidemia: Secondary | ICD-10-CM | POA: Diagnosis not present

## 2019-09-22 DIAGNOSIS — R0981 Nasal congestion: Secondary | ICD-10-CM

## 2019-09-22 NOTE — Progress Notes (Signed)
Virtual Visit Note  I connected with patient on 09/22/19 at 607pm by phone  and verified that I am speaking with the correct person using two identifiers. Robert Estes is currently located at home and patient is currently with them during visit. The provider, Rutherford Guys, MD is located in their office at time of visit.  I discussed the limitations, risks, security and privacy concerns of performing an evaluation and management service by telephone and the availability of in person appointments. I also discussed with the patient that there may be a patient responsible charge related to this service. The patient expressed understanding and agreed to proceed.   I provided 16  minutes of non-face-to-face time during this encounter.  Chief Complaint  Patient presents with  . Follow-up    discuss ct, has completed the augmentin for sinuses yet still finds when he lies down at night he is breathing out of his month and stuffy. Also wants his lipid panel repeated     HPI  Last OV a month ago treated for sinus infection vertigo resolved  Continues to have weakness in his right leg still present He still feels like he is about to trip over, denies any fall, his big toe gets numb He has chronic back problems Gets [facility] last MRI sept 2020, care everywhere Continues to have nasal congestion when he lies down, makes it hard to breath, he needs to suck on cough drop He had sinus surgery when he had his pituitary surgery at [facility] in jan 2019 (Transnasal Transsphenoidal Resection of Pituitary Adenoma) He would like to see local ENT   Allergies  Allergen Reactions  . Codeine Itching    Prior to Admission medications   Medication Sig Start Date End Date Taking? Authorizing Provider  acetaminophen (TYLENOL) 500 MG tablet Take 1,500 mg by mouth every 6 (six) hours as needed for moderate pain.   Yes [provider]  aspirin 81 MG tablet Take 81 mg by mouth daily.    Yes [provider]  CETIRIZINE HCL PO Take by mouth.   Yes [provider]  clobetasol (TEMOVATE) 0.05 % external solution Apply 1 application topically 2 (two) times daily. 09/16/18  Yes Rutherford Guys, MD  Evolocumab (REPATHA SURECLICK) 102 MG/ML SOAJ Inject 1 pen into the skin every 14 (fourteen) days. 09/12/19  Yes Belva Crome, MD  hydrocortisone (CORTEF) 10 MG tablet Take 1 tablet (10 mg total) by mouth every morning AND 0.5 tablets (5 mg total) every evening. 04/05/18  Yes Rutherford Guys, MD  metoprolol succinate (TOPROL-XL) 25 MG 24 hr tablet Take 1 tablet (25 mg total) by mouth daily. 01/31/19  Yes Belva Crome, MD  Multiple Vitamin (MULTIVITAMIN) tablet Take 1 tablet by mouth daily.   Yes [provider]  tamsulosin (FLOMAX) 0.4 MG CAPS capsule Take 0.8 mg by mouth at bedtime.  02/24/14  Yes [provider]  travoprost, benzalkonium, (TRAVATAN) 0.004 % ophthalmic solution Place 1 drop into both eyes at bedtime.    Yes [provider]  TURMERIC PO Take 2 capsules by mouth daily.   Yes [provider]  zolpidem (AMBIEN) 10 MG tablet TAKE 1 TABLET(10 MG) BY MOUTH AT BEDTIME AS NEEDED FOR SLEEP 04/22/19  Yes Rutherford Guys, MD  testosterone cypionate (DEPOTESTOSTERONE CYPIONATE) 200 MG/ML injection Inject 1 mL (200 mg total) into the muscle once a week. Patient not taking: Reported on 09/22/2019 04/05/18   [place],  Lilia Argue, MD    Past Medical History:  Diagnosis Date  . Arthritis    "hands, knees, elbows, fingers" (01/25/2013)  . Coronary artery disease    HX OF HEART STENTS X 2   2010--DR. H. SMITH IS PT'S CARDIOLOGIST, MILD AORTIC STENOSIS  . Dizziness   . Enlarged prostate    FLOMAX HELPS PT VOID  . Exertional shortness of breath   . GERD (gastroesophageal reflux disease)    OTC PREVACID AS NEEDED  . Glaucoma    BOTH EYES  . Gout   . Heart murmur   . Hyperlipidemia   . Hypertension   . Hypertension   . Iron deficiency  anemia    hx  . Migraine    "monthly" (01/25/2013)  . Peripheral vascular disease ([place])    CLAUDICATION    Past Surgical History:  Procedure Laterality Date  . CHOLECYSTECTOMY N/A 01/25/2013   Procedure: LAPAROSCOPIC CHOLECYSTECTOMY WITH INTRAOPERATIVE CHOLANGIOGRAM;  Surgeon: [name], MD;  Location: [place];  Service: General;  Laterality: N/A;  . CORONARY ANGIOPLASTY WITH STENT PLACEMENT  11/27/2008   "2" (01/25/2013)  . EUS  12/24/2011   Procedure: FULL UPPER ENDOSCOPIC ULTRASOUND (EUS) RADIAL;  Surgeon: [name], MD;  Location: [facility];  Service: Endoscopy;  Laterality: N/A;  . INCISION AND DRAINAGE     thrombosed hemorrhoid  . LAPAROSCOPIC CHOLECYSTECTOMY  01/25/2013    Social History   Tobacco Use  . Smoking status: Never Smoker  . Smokeless tobacco: Never Used  Substance Use Topics  . Alcohol use: No    Family History  Problem Relation Age of Onset  . Breast cancer Mother   . Cancer Mother   . Heart failure Mother   . Heart disease Father   . Heart attack Father     ROS Per hpi  Objective  Vitals as reported by the patient: none  Gen; AAOx3, NAD Speaking in full sentences   ASSESSMENT and PLAN  1. Right leg weakness 2. Abnormality of gait - Ambulatory referral to Neurology  3. Nasal congestion - Ambulatory referral to ENT  4. Other hyperlipidemia - Lipid panel; Future  FOLLOW-UP: prn   The above assessment and management plan was discussed with the patient. The patient verbalized understanding of and has agreed to the management plan. Patient is aware to call the clinic if symptoms persist or worsen. Patient is aware when to return to the clinic for a follow-up visit. Patient educated on when it is appropriate to go to the emergency department.     Rutherford Guys, MD Primary Care at [facility], [place] [address] Ph.  [phone] Fax [phone]

## 2019-09-26 ENCOUNTER — Other Ambulatory Visit: Payer: Self-pay | Admitting: Family Medicine

## 2019-09-26 NOTE — Telephone Encounter (Signed)
Requested medication (s) are due for refill today - Rx no longer current on medication list  Requested medication (s) are on the active medication list -no  Future visit scheduled -no  Last refill: 09/16/18  Notes to clinic: Request medication no longer on list  Requested Prescriptions  Pending Prescriptions Disp Refills   fluticasone (FLONASE) 50 MCG/ACT nasal spray [Pharmacy Med Name: FLUTICASONE 50MCG NASAL SP (120) RX] 16 g 2    Sig: SHAKE LIQUID AND USE 2 SPRAYS IN EACH NOSTRIL DAILY      Ear, Nose, and Throat: Nasal Preparations - Corticosteroids Passed - 09/26/2019  3:37 PM      Passed - Valid encounter within last 12 months    Recent Outpatient Visits           4 days ago Right leg weakness   [facility], Lilia Argue, MD   1 month ago Left leg weakness   [facility], Lilia Argue, MD   5 months ago Nocturnal leg cramps   [facility], Lilia Argue, MD   6 months ago Medicare annual wellness visit, subsequent   [facility], Lilia Argue, MD   1 year ago Other hyperlipidemia   [facility] at [facility], [place], MD                  Requested Prescriptions  Pending Prescriptions Disp Refills   fluticasone (FLONASE) 50 MCG/ACT nasal spray [Pharmacy Med Name: FLUTICASONE 50MCG NASAL SP (120) RX] 16 g 2    Sig: SHAKE LIQUID AND USE 2 SPRAYS IN EACH NOSTRIL DAILY      Ear, Nose, and Throat: Nasal Preparations - Corticosteroids Passed - 09/26/2019  3:37 PM      Passed - Valid encounter within last 12 months    Recent Outpatient Visits           4 days ago Right leg weakness   [facility], Lilia Argue, MD   1 month ago Left leg weakness   [facility], Lilia Argue, MD   5 months ago Nocturnal leg cramps   [facility], Lilia Argue, MD   6 months ago Medicare annual wellness visit, subsequent   [facility], Lilia Argue, MD   1 year ago  Other hyperlipidemia   Primary Care at [facility], [place], MD

## 2019-10-12 ENCOUNTER — Telehealth: Payer: Self-pay | Admitting: Family Medicine

## 2019-10-12 NOTE — Telephone Encounter (Signed)
Patient wife is calling because recent referral wont work for him. Because pt recently discovered that when he steps on his heel that's when he stumbles .wants to see a foot doctor/ not orthopedic doctor   Please reach out patient for more details

## 2019-10-13 NOTE — Telephone Encounter (Signed)
Spoke with pt referral for podiatry too narrow of a field pt to have evaluation for any potential causes as it may not come from only his foot pt will call to schedule soon

## 2019-10-14 ENCOUNTER — Telehealth: Payer: Self-pay | Admitting: Family Medicine

## 2019-10-14 NOTE — Telephone Encounter (Signed)
Spoke with pt and pt's wife Lelon Frohlich regarding the referral that [place] placed to neuro and they have agreed to go. I have sent a message to [facility] to contact the pt to get them set up.

## 2019-10-14 NOTE — Telephone Encounter (Signed)
Patients wife called in just to confirm that recent  neurology referral appt is for his foot . And gait problem /  Please confirm that this is the correct referral    Please reach out to wife Lelon Frohlich - at [phone]

## 2019-10-17 ENCOUNTER — Encounter: Payer: Self-pay | Admitting: Neurology

## 2019-11-03 ENCOUNTER — Telehealth: Payer: Self-pay

## 2019-11-03 NOTE — Telephone Encounter (Signed)
Received referral from [facility] [phone] Referral sent to scheduling Records on File

## 2019-11-12 ENCOUNTER — Ambulatory Visit: Payer: Medicare Other

## 2019-12-08 ENCOUNTER — Ambulatory Visit: Payer: Medicare Other

## 2019-12-10 ENCOUNTER — Ambulatory Visit: Payer: Medicare Other

## 2019-12-15 ENCOUNTER — Ambulatory Visit: Payer: Medicare Other | Attending: Internal Medicine

## 2019-12-15 DIAGNOSIS — Z23 Encounter for immunization: Secondary | ICD-10-CM

## 2019-12-15 NOTE — Progress Notes (Signed)
   [id number]  Name:  Robert [facility]ms[facility] Sr.    MRN: [id number] DOB: 05/11/1945  12/15/2019  Robert Estes was observed post Covid-19 immunization for 15 minutes without incident. He was provided with Vaccine Information Sheet and instruction to access the V-Safe system.   Robert Estes was instructed to call 911 with any severe reactions post vaccine: Marland Kitchen Difficulty breathing  . Swelling of face and throat  . A fast heartbeat  . A bad rash all over body  . Dizziness and weakness   Immunizations Administered    Name Date Dose VIS Date Route   Pfizer COVID-19 Vaccine 12/15/2019  5:12 PM 0.3 mL 11/16/2019 Intramuscular   Manufacturer: [place]   Lot: [id number]   [place]: [id number]

## 2019-12-26 ENCOUNTER — Other Ambulatory Visit: Payer: Self-pay

## 2019-12-26 MED ORDER — METOPROLOL SUCCINATE ER 25 MG PO TB24
25.0000 mg | ORAL_TABLET | Freq: Every day | ORAL | 0 refills | Status: DC
Start: 2019-12-26 — End: 2020-04-16

## 2019-12-28 ENCOUNTER — Telehealth: Payer: Self-pay | Admitting: Emergency Medicine

## 2019-12-28 NOTE — Telephone Encounter (Signed)
Patient has toc scheduled asking for courtesy refill  On  What is the name of the medication? zolpidem (AMBIEN) 10 MG tablet [[id number]   Have you contacted your pharmacy to request a refill? y  Which pharmacy would you like this sent to? [facility] [phone] - [address], [place] - [address], [place] [address]  Phone:  [phone] Fax:  [phone]    Patient notified that their request is being sent to the clinical staff for review and that they should receive a call once it is complete. If they do not receive a call within 72 hours they can check with their pharmacy or our office.

## 2019-12-29 ENCOUNTER — Other Ambulatory Visit: Payer: Self-pay | Admitting: Family Medicine

## 2019-12-29 DIAGNOSIS — G4700 Insomnia, unspecified: Secondary | ICD-10-CM

## 2019-12-29 MED ORDER — ZOLPIDEM TARTRATE 10 MG PO TABS: ORAL_TABLET | ORAL | 5 refills | Status: DC

## 2019-12-29 NOTE — Telephone Encounter (Signed)
Refills sent

## 2019-12-29 NOTE — Telephone Encounter (Signed)
Pt is asking for courtesy refill on ambien 10mg , last refill 04/22/19, next office visit 02/16/2020

## 2019-12-30 NOTE — Telephone Encounter (Signed)
Spoke with pt to inform that meds have been filled

## 2019-12-30 NOTE — Telephone Encounter (Signed)
Robert Estes called on behalf of her husband to say a special "thank you" for making sure his Rx for the sleeping pills was refilled. He's very appreciative!

## 2020-01-16 ENCOUNTER — Encounter: Payer: Self-pay | Admitting: Neurology

## 2020-01-16 ENCOUNTER — Other Ambulatory Visit: Payer: Self-pay

## 2020-01-16 ENCOUNTER — Ambulatory Visit: Payer: Medicare Other | Admitting: Neurology

## 2020-01-16 VITALS — BP 119/68 | HR 85 | Ht 67.0 in | Wt 173.0 lb

## 2020-01-16 DIAGNOSIS — M48062 Spinal stenosis, lumbar region with neurogenic claudication: Secondary | ICD-10-CM | POA: Diagnosis not present

## 2020-01-16 DIAGNOSIS — G959 Disease of spinal cord, unspecified: Secondary | ICD-10-CM | POA: Diagnosis not present

## 2020-01-16 MED ORDER — TIZANIDINE HCL 2 MG PO TABS: 2.0000 mg | ORAL_TABLET | Freq: Every day | ORAL | 3 refills | Status: DC

## 2020-01-16 NOTE — Progress Notes (Signed)
[facility] Clinic Note - Initial Visit   Date: 01/16/20  Robert Sr. MRN: [id number] DOB: 1945/02/01   Dear Dr. Mitchel Honour:  Thank you for your kind referral of [place]. for consultation of right leg weakness. Although his history is well known to you, please allow [place] to reiterate it for the purpose of our medical record. The patient was accompanied to the clinic by self.    History of Present Illness: Robert TINNEL Sr. is a 74 y.o. right-handed male with pituitary adenoma s/p resection (2020), CAD, hyperlipidemia, and hypertension presenting for evaluation of right leg cramping and weakness.   Starting around 2019, he began having right leg cramping and tingling pain in the foot, especially the great toe.  Over the past year, his symptoms have been getting worse where now he also notices that the right foot is weak.  He is most bothered by leg cramps which is triggered by stretching or abrupt movement. He feels that his walking is unsteady because prolonged standing tends to aggravate his right leg. He has tried heat and other home remedies with limited success.  He denies back pain. He has seen a chiropractor in the past and told that he has disc bulge in the lumbar region.   Out-side paper records, electronic medical record, and images have been reviewed where available and summarized as:  MRI pituitary wwo contrast 10/26/2019: Unchanged postsurgical appearance of the sella, with residual enhancing  tissue likely representing postsurgical granulation and remnant adenoma.     MRI brain wwo contrast 07/02/2017:  1. Stable pituitary macroadenoma. Stable mild mass effect on the optic chiasm and pre chiasmatic optic nerves. 2. No acute intracranial abnormality. 3. Stable mild chronic microvascular ischemic changes and moderate volume loss of the brain.   No results found for: HGBA1C Lab Results  Component Value Date   VITAMINB12 1,097  08/22/2019   Lab Results  Component Value Date   TSH 1.800 08/22/2019   No results found for: ESRSEDRATE, POCTSEDRATE  Past Medical History:  Diagnosis Date  . Arthritis    "hands, knees, elbows, fingers" (01/25/2013)  . Coronary artery disease    HX OF HEART STENTS X 2   2010--DR. H. SMITH IS PT'S CARDIOLOGIST, MILD AORTIC STENOSIS  . Dizziness   . Enlarged prostate    FLOMAX HELPS PT VOID  . Exertional shortness of breath   . GERD (gastroesophageal reflux disease)    OTC PREVACID AS NEEDED  . Glaucoma    BOTH EYES  . Gout   . Heart murmur   . Hyperlipidemia   . Hypertension   . Hypertension   . Iron deficiency anemia    hx  . Migraine    "monthly" (01/25/2013)  . Peripheral vascular disease ([place])    CLAUDICATION    Past Surgical History:  Procedure Laterality Date  . CHOLECYSTECTOMY N/A 01/25/2013   Procedure: LAPAROSCOPIC CHOLECYSTECTOMY WITH INTRAOPERATIVE CHOLANGIOGRAM;  Surgeon: [name], MD;  Location: [place];  Service: General;  Laterality: N/A;  . CORONARY ANGIOPLASTY WITH STENT PLACEMENT  11/27/2008   "2" (01/25/2013)  . EUS  12/24/2011   Procedure: FULL UPPER ENDOSCOPIC ULTRASOUND (EUS) RADIAL;  Surgeon: [name], MD;  [facility];  Service: Endoscopy;  Laterality: N/A;  . INCISION AND DRAINAGE     thrombosed hemorrhoid  . LAPAROSCOPIC CHOLECYSTECTOMY  01/25/2013     Medications:  Outpatient Encounter Medications as of 01/16/2020  Medication Sig Note  . acetaminophen (TYLENOL)  500 MG tablet Take 1,500 mg by mouth every 6 (six) hours as needed for moderate pain.   Marland Kitchen aspirin 81 MG tablet Take 81 mg by mouth daily.   Marland Kitchen CETIRIZINE HCL PO Take by mouth.   . clobetasol (TEMOVATE) 0.05 % external solution Apply 1 application topically 2 (two) times daily.   . Evolocumab (REPATHA SURECLICK) 403 MG/ML SOAJ Inject 1 pen into the skin every 14 (fourteen) days.   . famotidine (PEPCID) 20 MG tablet 2 tablets   . fluticasone (FLONASE) 50  MCG/ACT nasal spray SHAKE LIQUID AND USE 2 SPRAYS IN EACH NOSTRIL DAILY   . hydrocortisone (CORTEF) 10 MG tablet Take 1 tablet (10 mg total) by mouth every morning AND 0.5 tablets (5 mg total) every evening. 04/04/2019: Uses mostly in summer  . metoprolol succinate (TOPROL-XL) 25 MG 24 hr tablet Take 1 tablet (25 mg total) by mouth daily. Please keep upcoming appt in January 2022 with Dr. Tamala Julian before anymore refills. Thank you   . Multiple Vitamin (MULTIVITAMIN) tablet Take 1 tablet by mouth daily.   . tamsulosin (FLOMAX) 0.4 MG CAPS capsule Take 0.8 mg by mouth at bedtime.  07/31/2014: .   . travoprost, benzalkonium, (TRAVATAN) 0.004 % ophthalmic solution Place 1 drop into both eyes at bedtime.    . TURMERIC PO Take 2 capsules by mouth daily.   Marland Kitchen zolpidem (AMBIEN) 10 MG tablet TAKE 1 TABLET(10 MG) BY MOUTH AT BEDTIME AS NEEDED FOR SLEEP   . testosterone cypionate (DEPOTESTOSTERONE CYPIONATE) 200 MG/ML injection Inject 1 mL (200 mg total) into the muscle once a week. (Patient not taking: No sig reported)    No facility-administered encounter medications on file as of 01/16/2020.    Allergies:  Allergies  Allergen Reactions  . Codeine Itching    Family History: Family History  Problem Relation Age of Onset  . Breast cancer Mother   . Cancer Mother   . Heart failure Mother   . Heart disease Father   . Heart attack Father     Social History: Social History   Tobacco Use  . Smoking status: Never Smoker  . Smokeless tobacco: Never Used  Vaping Use  . Vaping Use: Never used  Substance Use Topics  . Alcohol use: Yes    Comment: Drinks a beer or glass of wine 1-2 times per month   . Drug use: No   Social History   Social History Narrative   Right Handed   Lives in a one story home    Vital Signs:  BP 119/68   Pulse 85   Ht 5\' 7"  (1.702 m)   Wt 173 lb (78.5 kg)   SpO2 97%   BMI 27.10 kg/m   Neurological Exam: MENTAL STATUS including orientation to time, place, person,  recent and remote memory, attention span and concentration, language, and fund of knowledge is normal.  Speech is not dysarthric.  CRANIAL NERVES: II:  No visual field defects.  Unremarkable fundi.   III-IV-VI: Pupils equal round and reactive to light.  Normal conjugate, extra-ocular eye movements in all directions of gaze.  No nystagmus.  No ptosis.   V:  Normal facial sensation.    VII:  Normal facial symmetry and movements.   VIII:  Normal hearing and vestibular function.   IX-X:  Normal palatal movement.   XI:  Normal shoulder shrug and head rotation.   XII:  Normal tongue strength and range of motion, no deviation or fasciculation.  MOTOR:  No atrophy,  fasciculations or abnormal movements.  No pronator drift.   Upper Extremity:  Right  Left  Deltoid  5/5   5/5   Biceps  5/5   5/5   Triceps  5/5   5/5   Infraspinatus 5/5  5/5  Medial pectoralis 5/5  5/5  Wrist extensors  5/5   5/5   Wrist flexors  5/5   5/5   Finger extensors  5/5   5/5   Finger flexors  5/5   5/5   Dorsal interossei  5/5   5/5   Abductor pollicis  5/5   5/5   Tone (Ashworth scale)  0  0   Lower Extremity:  Right  Left  Hip flexors  5/5   5/5   Hip extensors  5/5   5/5   Adductor 5/5  5/5  Abductor 5/5  5/5  Knee flexors  5/5   5/5   Knee extensors  5/5   5/5   Dorsiflexors  5/5   5/5   Plantarflexors  5/5   5/5   Toe extensors  5/5   5/5   Toe flexors  5/5   5/5   Tone (Ashworth scale)  0  0   MSRs:  Right        Left                  brachioradialis 2+  2+  biceps 2+  2+  triceps 2+  2+  patellar 3+  3+  ankle jerk 2+  2+  Hoffman no  no  plantar response down  down   SENSORY:  Normal and symmetric perception of light touch, pinprick, vibration, and proprioception.  Romberg's sign absent.   COORDINATION/GAIT: Normal finger-to- nose-finger.  Intact rapid alternating movements bilaterally. Mildly wide-based gait, unassisted.  Stressed and tandem gait intact   IMPRESSION: Possible  neurogenic claudication due to lumbar canal stenosis manifesting with exertional right leg weakness, cramping, and tingling of the foot.   - MRI lumbar spine wo contrast  - Start PT for right lumbosacral radiculopathy  - Start tizanidine 2mg  at bedtime for cramps. Titrate as needed  Further recommendations pending results.  Thank you for allowing me to participate in patient's care.  If I can answer any additional questions, I would be pleased to do so.    Sincerely,    Satya Bohall K. Posey Pronto, DO

## 2020-01-16 NOTE — Patient Instructions (Addendum)
Start tizanidine 2mg  at bedtime for cramps  Start physical therapy for right leg pain  MRI lumbar spine without contrast at [facility]

## 2020-01-31 ENCOUNTER — Ambulatory Visit
Admission: RE | Admit: 2020-01-31 | Discharge: 2020-01-31 | Disposition: A | Discharge status: Home / Self Care | Payer: Medicare Other | Source: Ambulatory Visit | Attending: Neurology | Admitting: Neurology

## 2020-01-31 ENCOUNTER — Other Ambulatory Visit: Payer: Self-pay

## 2020-01-31 DIAGNOSIS — G959 Disease of spinal cord, unspecified: Secondary | ICD-10-CM

## 2020-01-31 DIAGNOSIS — M48062 Spinal stenosis, lumbar region with neurogenic claudication: Secondary | ICD-10-CM

## 2020-02-01 ENCOUNTER — Telehealth: Payer: Self-pay | Admitting: Neurology

## 2020-02-01 NOTE — Telephone Encounter (Signed)
Called and discussed results of MRI lumbar spine with patient. There is lumbar spondylosis and degenerative changes with disc protrusion causing right subarticular stenosis causing right L5 radiculopathy, which would cause his right leg and foot paresthesias.  Imaging does not show severe canal stenosis to explain his myelopathic findings on exam.  I discussed options of looking higher in the spinal cord with MRI thoracic and cervical cord and/or starting physical therapy for lumbar radiculopathy.  It was decided to start PT and consider imaging, if symptoms do not improve.  He will see me back in 2 months for follow-up and I will reassess.  Sanii Kukla K. Allena Katz, DO

## 2020-02-16 ENCOUNTER — Other Ambulatory Visit: Payer: Self-pay

## 2020-02-16 ENCOUNTER — Ambulatory Visit (INDEPENDENT_AMBULATORY_CARE_PROVIDER_SITE_OTHER): Payer: Medicare Other | Admitting: Emergency Medicine

## 2020-02-16 ENCOUNTER — Encounter: Payer: Self-pay | Admitting: Emergency Medicine

## 2020-02-16 VITALS — BP 138/72 | HR 62 | Temp 97.6°F | Resp 16 | Ht 66.0 in | Wt 174.0 lb

## 2020-02-16 DIAGNOSIS — I10 Essential (primary) hypertension: Secondary | ICD-10-CM

## 2020-02-16 DIAGNOSIS — G720 Drug-induced myopathy: Secondary | ICD-10-CM

## 2020-02-16 DIAGNOSIS — D352 Benign neoplasm of pituitary gland: Secondary | ICD-10-CM

## 2020-02-16 DIAGNOSIS — R29818 Other symptoms and signs involving the nervous system: Secondary | ICD-10-CM

## 2020-02-16 DIAGNOSIS — N1831 Chronic kidney disease, stage 3a: Secondary | ICD-10-CM

## 2020-02-16 DIAGNOSIS — Z8669 Personal history of other diseases of the nervous system and sense organs: Secondary | ICD-10-CM

## 2020-02-16 DIAGNOSIS — Z7689 Persons encountering health services in other specified circumstances: Secondary | ICD-10-CM

## 2020-02-16 DIAGNOSIS — E785 Hyperlipidemia, unspecified: Secondary | ICD-10-CM

## 2020-02-16 DIAGNOSIS — I350 Nonrheumatic aortic (valve) stenosis: Secondary | ICD-10-CM

## 2020-02-16 DIAGNOSIS — T466X5A Adverse effect of antihyperlipidemic and antiarteriosclerotic drugs, initial encounter: Secondary | ICD-10-CM

## 2020-02-16 DIAGNOSIS — G4700 Insomnia, unspecified: Secondary | ICD-10-CM

## 2020-02-16 DIAGNOSIS — I739 Peripheral vascular disease, unspecified: Secondary | ICD-10-CM | POA: Diagnosis not present

## 2020-02-16 DIAGNOSIS — G9519 Other vascular myelopathies: Secondary | ICD-10-CM

## 2020-02-16 DIAGNOSIS — I2510 Atherosclerotic heart disease of native coronary artery without angina pectoris: Secondary | ICD-10-CM | POA: Diagnosis not present

## 2020-02-16 DIAGNOSIS — K219 Gastro-esophageal reflux disease without esophagitis: Secondary | ICD-10-CM

## 2020-02-16 MED ORDER — PANTOPRAZOLE SODIUM 40 MG PO TBEC: 40.0000 mg | DELAYED_RELEASE_TABLET | Freq: Every day | ORAL | 3 refills | Status: DC

## 2020-02-16 NOTE — Progress Notes (Signed)
Cardiology Office Note:    Date:  02/20/2020   ID:  Robert Saras Sr., DOB 1945-06-17, MRN [id number]  PCP:  Horald Pollen, MD  Cardiologist:  Sinclair Grooms, MD   Referring MD: Jacelyn Pi, Lilia Argue, *   No chief complaint on file.   History of Present Illness:    Robert BUREK Sr. is a 75 y.o. male with a hx of CAD with prior stent implantation and RCA and circumflex 2010, hypertension, probable bicuspid aortic valve with mild aortic stenosis, and left carotid bruit. Also has history of hyperlipidemia.  Robert Estes is doing well.  He is having issues with his lumbar spine.  His right leg and foot give him difficulty when he walks.  This is impaired his ability to exercise.  He has not had angina.  There have been no neurological complaints.  He denies orthopnea, PND, syncope, and exertional angina.  Past Medical History:  Diagnosis Date  . Arthritis    "hands, knees, elbows, fingers" (01/25/2013)  . Coronary artery disease    HX OF HEART STENTS X 2   2010--DR. H. Obaloluwa Delatte IS PT'S CARDIOLOGIST, MILD AORTIC STENOSIS  . Dizziness   . Enlarged prostate    FLOMAX HELPS PT VOID  . Exertional shortness of breath   . GERD (gastroesophageal reflux disease)    OTC PREVACID AS NEEDED  . Glaucoma    BOTH EYES  . Gout   . Heart murmur   . Hyperlipidemia   . Hypertension   . Hypertension   . Iron deficiency anemia    hx  . Migraine    "monthly" (01/25/2013)  . Peripheral vascular disease ([place])    CLAUDICATION    Past Surgical History:  Procedure Laterality Date  . CHOLECYSTECTOMY N/A 01/25/2013   Procedure: LAPAROSCOPIC CHOLECYSTECTOMY WITH INTRAOPERATIVE CHOLANGIOGRAM;  Surgeon: [name], MD;  Location: [facility];  Service: General;  Laterality: N/A;  . CORONARY ANGIOPLASTY WITH STENT PLACEMENT  11/27/2008   "2" (01/25/2013)  . EUS  12/24/2011   Procedure: FULL UPPER ENDOSCOPIC ULTRASOUND (EUS) RADIAL;  Surgeon: [name], MD;  Location: [facility];   Service: Endoscopy;  Laterality: N/A;  . INCISION AND DRAINAGE     thrombosed hemorrhoid  . LAPAROSCOPIC CHOLECYSTECTOMY  01/25/2013    Current Medications: Current Meds  Medication Sig  . acetaminophen (TYLENOL) 500 MG tablet Take 1,500 mg by mouth every 6 (six) hours as needed for moderate pain.  Marland Kitchen aspirin 81 MG tablet Take 81 mg by mouth daily.  Marland Kitchen CETIRIZINE HCL PO Take by mouth.  . Evolocumab (REPATHA SURECLICK) XX123456 MG/ML SOAJ Inject 1 pen into the skin every 14 (fourteen) days.  . famotidine (PEPCID) 20 MG tablet 2 tablets  . fluticasone (FLONASE) 50 MCG/ACT nasal spray SHAKE LIQUID AND USE 2 SPRAYS IN EACH NOSTRIL DAILY  . hydrocortisone (CORTEF) 10 MG tablet Take 1 tablet (10 mg total) by mouth every morning AND 0.5 tablets (5 mg total) every evening.  . metoprolol succinate (TOPROL-XL) 25 MG 24 hr tablet Take 1 tablet (25 mg total) by mouth daily. Please keep upcoming appt in January 2022 with Dr. Tamala Julian before anymore refills. Thank you  . Multiple Vitamin (MULTIVITAMIN) tablet Take 1 tablet by mouth daily.  . pantoprazole (PROTONIX) 40 MG tablet Take 1 tablet (40 mg total) by mouth daily.  . tamsulosin (FLOMAX) 0.4 MG CAPS capsule Take 0.8 mg by mouth at bedtime.   Marland Kitchen tiZANidine (ZANAFLEX) 2 MG tablet Take 1 tablet (  2 mg total) by mouth at bedtime.  . travoprost, benzalkonium, (TRAVATAN) 0.004 % ophthalmic solution Place 1 drop into both eyes at bedtime.   . TURMERIC PO Take 2 capsules by mouth daily.  Marland Kitchen zolpidem (AMBIEN) 10 MG tablet TAKE 1 TABLET(10 MG) BY MOUTH AT BEDTIME AS NEEDED FOR SLEEP     Allergies:   Codeine   Social History   Socioeconomic History  . Marital status: Married    Spouse name: Not on file  . Number of children: Not on file  . Years of education: Not on file  . Highest education level: Not on file  Occupational History  . Not on file  Tobacco Use  . Smoking status: Never Smoker  . Smokeless tobacco: Never Used  Vaping Use  . Vaping Use: Never  used  Substance and Sexual Activity  . Alcohol use: Yes    Comment: Drinks a beer or glass of wine 1-2 times per month   . Drug use: No  . Sexual activity: Never  Other Topics Concern  . Not on file  Social History Narrative   Right Handed   Lives in a one story home   Social Determinants of Health   Financial Resource Strain: Not on file  Food Insecurity: Not on file  Transportation Needs: Not on file  Physical Activity: Not on file  Stress: Not on file  Social Connections: Not on file     Family History: The patient's family history includes Breast cancer in his mother; Cancer in his mother; Heart attack in his father; Heart disease in his father; Heart failure in his mother.  ROS:   Please see the history of present illness.    Limited physically by L5-S1 disease.  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  All other systems reviewed and are negative.  EKGs/Labs/Other Studies Reviewed:    The following studies were reviewed today: 2D Doppler echocardiogram 2020: IMPRESSIONS    1. Left ventricular ejection fraction, by visual estimation, is 60 to  65%. The left ventricle has normal function. Left ventricular septal wall  thickness was mildly increased. There is no left ventricular hypertrophy.  2. Global right ventricle has normal systolic function.The right  ventricular size is normal. No increase in right ventricular wall  thickness.  3. Left atrial size was normal.  4. Right atrial size was normal.  5. Mild mitral annular calcification.  6. The mitral valve is normal in structure. Mild mitral valve  regurgitation. No evidence of mitral stenosis.  7. The tricuspid valve is normal in structure. Tricuspid valve  regurgitation is mild.  8. The aortic valve has an indeterminant number of cusps. Aortic valve  regurgitation is trivial. Mild to moderate aortic valve stenosis.  9. The pulmonic valve was grossly normal. Pulmonic valve regurgitation is  mild.   10. Mildly elevated pulmonary artery systolic pressure.  11. The inferior vena cava is normal in size with greater than 50%  respiratory variability, suggesting right atrial pressure of 3 mmHg.   EKG:  EKG performed today demonstrates sinus rhythm with normal appearance.  PR interval is 196 ms.  Recent Labs: 08/22/2019: ALT 45; BUN 14; Creatinine, Ser 1.57; Hemoglobin 16.5; Platelets 185; Potassium 4.8; Sodium 142; TSH 1.800  Recent Lipid Panel    Component Value Date/Time   CHOL 119 08/22/2019 1038   TRIG 225 (H) 08/22/2019 1038   HDL 38 (L) 08/22/2019 1038   CHOLHDL 3.1 08/22/2019 1038   CHOLHDL 5.5 (H) 12/14/2014 0901  VLDL 17 12/14/2014 0901   LDLCALC 45 08/22/2019 1038    Physical Exam:    VS:  BP 130/72   Pulse 71   Ht 5\' 6"  (1.676 m)   Wt 175 lb (79.4 kg)   SpO2 97%   BMI 28.25 kg/m     Wt Readings from Last 3 Encounters:  02/20/20 175 lb (79.4 kg)  02/16/20 174 lb (78.9 kg)  01/16/20 173 lb (78.5 kg)     GEN: Appears Healthy.  Wearing His [employer].  He has a big [employer].. No acute distress HEENT: Normal NECK: No JVD. LYMPHATICS: No lymphadenopathy CARDIAC: 3/6 crescendo decrescendo murmur of aortic stenosis.  No diastolic murmur. RRR S4 is present without S3 gallop, or edema. VASCULAR:  Normal Pulses. No bruits. RESPIRATORY:  Clear to auscultation without rales, wheezing or rhonchi  ABDOMEN: Soft, non-tender, non-distended, No pulsatile mass, MUSCULOSKELETAL: No deformity  SKIN: Warm and dry NEUROLOGIC:  Alert and oriented x 3 PSYCHIATRIC:  Normal affect   ASSESSMENT:    1. Aortic valve stenosis, etiology of cardiac valve disease unspecified   2. Coronary artery disease involving native coronary artery of native heart without angina pectoris   3. Essential hypertension   4. Peripheral vascular disease ([place])   5. Pituitary adenoma ([facility])   6. Statin myopathy   7. Other hyperlipidemia   8. Educated about COVID-19 virus  infection    PLAN:    In order of problems listed above:  1. 2D Doppler echocardiogram.  78-month follow-up. 2. Secondary prevention discussed in detail.  Needs to increase physical activity to achieve greater than 150 minutes of moderate activity. 3. Current blood pressure is excellent.  Decrease salt in diet. 4. Some of the complaints that we thought were due to PAD are likely related to lumbar disc disease. 5. Did not discuss 6. Not on a statin.  Currently on Repatha.  Lipid panel and liver panel today. 7. Last LDL in July was less than 45. 8. Vaccinated and practicing medication.  Overall education and awareness concerning primary/secondary risk prevention was discussed in detail: LDL less than 70, hemoglobin A1c less than 7, blood pressure target less than 130/80 mmHg, >150 minutes of moderate aerobic activity per week, avoidance of smoking, weight control (via diet and exercise), and continued surveillance/management of/for obstructive sleep apnea.    Medication Adjustments/Labs and Tests Ordered: Current medicines are reviewed at length with the patient today.  Concerns regarding medicines are outlined above.  Orders Placed This Encounter  Procedures  . Lipid panel  . Hepatic function panel  . Basic metabolic panel  . EKG 12-Lead  . ECHOCARDIOGRAM COMPLETE   No orders of the defined types were placed in this encounter.   Patient Instructions  Medication Instructions:  Your physician recommends that you continue on your current medications as directed. Please refer to the Current Medication list given to you today.  *If you need a refill on your cardiac medications before your next appointment, please call your pharmacy*   Lab Work: Lipid, Liver and BMET today  If you have labs (blood work) drawn today and your tests are completely normal, you will receive your results only by: Marland Kitchen MyChart Message (if you have MyChart) OR . A paper copy in the mail If you have any lab  test that is abnormal or we need to change your treatment, we will call you to review the results.   Testing/Procedures: Your physician has requested that you have an echocardiogram.  Echocardiography is a painless test that uses sound waves to create images of your heart. It provides your doctor with information about the size and shape of your heart and how well your heart's chambers and valves are working. This procedure takes approximately one hour. There are no restrictions for this procedure.    Follow-Up: At [facility], you and your health needs are our priority.  As part of our continuing mission to provide you with exceptional heart care, we have created designated Provider Care Teams.  These Care Teams include your primary Cardiologist (physician) and Advanced Practice Providers (APPs -  Physician Assistants and Nurse Practitioners) who all work together to provide you with the care you need, when you need it.  We recommend signing up for the patient portal called "MyChart".  Sign up information is provided on this After Visit Summary.  MyChart is used to connect with patients for Virtual Visits (Telemedicine).  Patients are able to view lab/test results, encounter notes, upcoming appointments, etc.  Non-urgent messages can be sent to your provider as well.   To learn more about what you can do with MyChart, go to [email or web address].    Your next appointment:   6 month(s)  The format for your next appointment:   In Person  Provider:   You may see Sinclair Grooms, MD or one of the following Advanced Practice Providers on your designated Care Team:    Cecilie Kicks, NP  Kathyrn Drown, NP    Other Instructions      Signed, Sinclair Grooms, MD  02/20/2020 8:59 AM    [place]

## 2020-02-16 NOTE — Patient Instructions (Addendum)
   If you have lab work done today you will be contacted with your lab results within the next 2 weeks.  If you have not heard from us then please contact us. The fastest way to get your results is to register for My Chart.   IF you received an x-ray today, you will receive an invoice from [facility]. Please contact [facility] at [phone] with questions or concerns regarding your invoice.   IF you received labwork today, you will receive an invoice from [facility]. Please contact [facility] at [phone] with questions or concerns regarding your invoice.   Our billing staff will not be able to assist you with questions regarding bills from these companies.  You will be contacted with the lab results as soon as they are available. The fastest way to get your results is to activate your My Chart account. Instructions are located on the last page of this paperwork. If you have not heard from us regarding the results in 2 weeks, please contact this office.       Health Maintenance After Age 65 After age 65, you are at a higher risk for certain long-term diseases and infections as well as injuries from falls. Falls are a major cause of broken bones and head injuries in people who are older than age 65. Getting regular preventive care can help to keep you healthy and well. Preventive care includes getting regular testing and making lifestyle changes as recommended by your health care provider. Talk with your health care provider about:  Which screenings and tests you should have. A screening is a test that checks for a disease when you have no symptoms.  A diet and exercise plan that is right for you. What should I know about screenings and tests to prevent falls? Screening and testing are the best ways to find a health problem early. Early diagnosis and treatment give you the best chance of managing medical conditions that are common after age 65. Certain conditions and  lifestyle choices may make you more likely to have a fall. Your health care provider may recommend:  Regular vision checks. Poor vision and conditions such as cataracts can make you more likely to have a fall. If you wear glasses, make sure to get your prescription updated if your vision changes.  Medicine review. Work with your health care provider to regularly review all of the medicines you are taking, including over-the-counter medicines. Ask your health care provider about any side effects that may make you more likely to have a fall. Tell your health care provider if any medicines that you take make you feel dizzy or sleepy.  Osteoporosis screening. Osteoporosis is a condition that causes the bones to get weaker. This can make the bones weak and cause them to break more easily.  Blood pressure screening. Blood pressure changes and medicines to control blood pressure can make you feel dizzy.  Strength and balance checks. Your health care provider may recommend certain tests to check your strength and balance while standing, walking, or changing positions.  Foot health exam. Foot pain and numbness, as well as not wearing proper footwear, can make you more likely to have a fall.  Depression screening. You may be more likely to have a fall if you have a fear of falling, feel emotionally low, or feel unable to do activities that you used to do.  Alcohol use screening. Using too much alcohol can affect your balance and may make you more   likely to have a fall. What actions can I take to lower my risk of falls? General instructions  Talk with your health care provider about your risks for falling. Tell your health care provider if: ? You fall. Be sure to tell your health care provider about all falls, even ones that seem minor. ? You feel dizzy, sleepy, or off-balance.  Take over-the-counter and prescription medicines only as told by your health care provider. These include any  supplements.  Eat a healthy diet and maintain a healthy weight. A healthy diet includes low-fat dairy products, low-fat (lean) meats, and fiber from whole grains, beans, and lots of fruits and vegetables. Home safety  Remove any tripping hazards, such as rugs, cords, and clutter.  Install safety equipment such as grab bars in bathrooms and safety rails on stairs.  Keep rooms and walkways well-lit. Activity  Follow a regular exercise program to stay fit. This will help you maintain your balance. Ask your health care provider what types of exercise are appropriate for you.  If you need a cane or walker, use it as recommended by your health care provider.  Wear supportive shoes that have nonskid soles.   Lifestyle  Do not drink alcohol if your health care provider tells you not to drink.  If you drink alcohol, limit how much you have: ? 0-1 drink a day for women. ? 0-2 drinks a day for men.  Be aware of how much alcohol is in your drink. In the [place] one drink equals one typical bottle of beer (12 oz), one-half glass of wine (5 oz), or one shot of hard liquor (1 oz).  Do not use any products that contain nicotine or tobacco, such as cigarettes and e-cigarettes. If you need help quitting, ask your health care provider. Summary  Having a healthy lifestyle and getting preventive care can help to protect your health and wellness after age 65.  Screening and testing are the best way to find a health problem early and help you avoid having a fall. Early diagnosis and treatment give you the best chance for managing medical conditions that are more common for people who are older than age 65.  Falls are a major cause of broken bones and head injuries in people who are older than age 65. Take precautions to prevent a fall at home.  Work with your health care provider to learn what changes you can make to improve your health and wellness and to prevent falls. This information is not intended  to replace advice given to you by your health care provider. Make sure you discuss any questions you have with your health care provider. Document Revised: 05/06/2018 Document Reviewed: 11/26/2016 Elsevier Patient Education  2021 [employer]  

## 2020-02-16 NOTE — Progress Notes (Signed)
Robert LungerGeorge J Gallentine Sr. 75 y.o.   Chief Complaint  Patient presents with  . Transitions Of Care    HISTORY OF PRESENT ILLNESS: This is a 75 y.o. male here to establish care with me.  Used to see Dr. Leretha PolSantiago.  First visit with me. Has the following chronic medical problems: 1.  Hypertension 2.  History of coronary artery disease status post stent placement 3.  History of aortic stenosis 4.  History of GERD.  Symptoms acting up lately.  Taking Pepcid and Tums.  Requesting GI referral. 5.  Peripheral vascular disease 6.  Neurogenic claudication.  Recent lumbar MRI shows significant pathology. 7.  History of pituitary adenoma surgically removed in 2020.  Takes hydrocortisone daily as replacement therapy. 8.  Dyslipidemia with statin intolerance.  Takes Repatha every 2 weeks 9.  History of glaucoma 10.  History of insomnia Feels good.  Has no complaints or medical concerns today.  HPI   Prior to Admission medications   Medication Sig Start Date End Date Taking? Authorizing Provider  acetaminophen (TYLENOL) 500 MG tablet Take 1,500 mg by mouth every 6 (six) hours as needed for moderate pain.   Yes [provider]  aspirin 81 MG tablet Take 81 mg by mouth daily.   Yes [provider]  CETIRIZINE HCL PO Take by mouth.   Yes [provider]  Evolocumab (REPATHA SURECLICK) 140 MG/ML SOAJ Inject 1 pen into the skin every 14 (fourteen) days. 09/12/19  Yes Lyn RecordsSmith, Henry W, MD  famotidine (PEPCID) 20 MG tablet 2 tablets   Yes [provider]  fluticasone (FLONASE) 50 MCG/ACT nasal spray SHAKE LIQUID AND USE [facility] 09/26/19  Yes [name], [name], MD  hydrocortisone (CORTEF) 10 MG tablet Take 1 tablet (10 mg total) by mouth every morning AND 0.5 tablets (5 mg total) every evening. 04/05/18  Yes [name], [name], MD  metoprolol succinate (TOPROL-XL) 25 MG 24 hr tablet Take 1 tablet (25 mg total) by mouth daily. Please keep upcoming appt  in January 2022 with Dr. Katrinka BlazingSmith before anymore refills. Thank you 12/26/19  Yes Lyn RecordsSmith, Henry W, MD  Multiple Vitamin (MULTIVITAMIN) tablet Take 1 tablet by mouth daily.   Yes [provider]  tamsulosin (FLOMAX) 0.4 MG CAPS capsule Take 0.8 mg by mouth at bedtime.  02/24/14  Yes [provider]  tiZANidine (ZANAFLEX) 2 MG tablet Take 1 tablet (2 mg total) by mouth at bedtime. 01/16/20  Yes Patel, Donika K, DO  travoprost, benzalkonium, (TRAVATAN) 0.004 % ophthalmic solution Place 1 drop into both eyes at bedtime.    Yes [provider]  TURMERIC PO Take 2 capsules by mouth daily.   Yes [provider]  zolpidem (AMBIEN) 10 MG tablet TAKE 1 TABLET(10 MG) BY MOUTH AT BEDTIME AS NEEDED FOR SLEEP 12/29/19  Yes Just, [name], FNP  clobetasol (TEMOVATE) 0.05 % external solution Apply 1 application topically 2 (two) times daily. Patient not taking: Reported on 02/16/2020 09/16/18   [name], [name], MD  testosterone cypionate (DEPOTESTOSTERONE CYPIONATE) 200 MG/ML injection Inject 1 mL (200 mg total) into the muscle once a week. Patient not taking: Reported on 02/16/2020 04/05/18   [name], [name], MD    Allergies  Allergen Reactions  . Codeine Itching    Patient Active Problem List   Diagnosis Date Noted  . Statin myopathy 09/01/2018  . Pituitary tumor 02/19/2018  . Deviated septum 08/17/2017  . Nasal turbinate hypertrophy  08/17/2017  . Pituitary adenoma ([name]) 08/17/2017  . Contracture of joint of right hand 04/20/2015  . Aortic stenosis 03/10/2014  . Left carotid bruit 03/10/2014  . Glaucoma   . Hypertension   . Enlarged prostate   . Coronary artery disease   . Peripheral vascular disease ([place])   . Hyperlipidemia   . GERD (gastroesophageal reflux disease)   . Gout   . Exertional shortness of breath   . Iron deficiency anemia   . Migraine   . Arthritis   . S/P laparoscopic cholecystectomy 01/25/2013    Past Medical History:  Diagnosis Date   . Arthritis    "hands, knees, elbows, fingers" (01/25/2013)  . Coronary artery disease    HX OF HEART STENTS X 2   2010--DR. H. SMITH IS PT'S CARDIOLOGIST, MILD AORTIC STENOSIS  . Dizziness   . Enlarged prostate    FLOMAX HELPS PT VOID  . Exertional shortness of breath   . GERD (gastroesophageal reflux disease)    OTC PREVACID AS NEEDED  . Glaucoma    BOTH EYES  . Gout   . Heart murmur   . Hyperlipidemia   . Hypertension   . Hypertension   . Iron deficiency anemia    hx  . Migraine    "monthly" (01/25/2013)  . Peripheral vascular disease ([place])    CLAUDICATION    Past Surgical History:  Procedure Laterality Date  . CHOLECYSTECTOMY N/A 01/25/2013   Procedure: LAPAROSCOPIC CHOLECYSTECTOMY WITH INTRAOPERATIVE CHOLANGIOGRAM;  Surgeon: [name], MD;  Location: [facility];  Service: General;  Laterality: N/A;  . CORONARY ANGIOPLASTY WITH STENT PLACEMENT  11/27/2008   "2" (01/25/2013)  . EUS  12/24/2011   Procedure: FULL UPPER ENDOSCOPIC ULTRASOUND (EUS) RADIAL;  Surgeon: [name], MD;  Location: [facility];  Service: Endoscopy;  Laterality: N/A;  . INCISION AND DRAINAGE     thrombosed hemorrhoid  . LAPAROSCOPIC CHOLECYSTECTOMY  01/25/2013    Social History   Socioeconomic History  . Marital status: Married    Spouse name: Not on file  . Number of children: Not on file  . Years of education: Not on file  . Highest education level: Not on file  Occupational History  . Not on file  Tobacco Use  . Smoking status: Never Smoker  . Smokeless tobacco: Never Used  Vaping Use  . Vaping Use: Never used  Substance and Sexual Activity  . Alcohol use: Yes    Comment: Drinks a beer or glass of wine 1-2 times per month   . Drug use: No  . Sexual activity: Never  Other Topics Concern  . Not on file  Social History Narrative   Right Handed   Lives in a one story home   Social Determinants of Health   Financial Resource Strain: Not on file  Food Insecurity: Not on  file  Transportation Needs: Not on file  Physical Activity: Not on file  Stress: Not on file  Social Connections: Not on file  Intimate Partner Violence: Not on file    Family History  Problem Relation Age of Onset  . Breast cancer Mother   . Cancer Mother   . Heart failure Mother   . Heart disease Father   . Heart attack Father      Review of Systems  Constitutional: Negative.  Negative for chills and fever.  HENT: Negative.  Negative for congestion and sore throat.   Respiratory: Negative.  Negative for cough and shortness of breath.   Cardiovascular:  Negative.  Negative for chest pain.  Gastrointestinal: Negative.  Negative for abdominal pain, blood in stool, diarrhea, melena, nausea and vomiting.  Genitourinary: Negative.  Negative for dysuria and hematuria.  Musculoskeletal: Negative.   Skin: Negative.  Negative for rash.  Neurological: Negative.  Negative for dizziness and headaches.  All other systems reviewed and are negative.  Today's Vitals   02/16/20 0911 02/16/20 0958  BP: (!) 149/71 138/72  Pulse: 62   Resp: 16   Temp: 97.6 F (36.4 C)   TempSrc: Temporal   SpO2: 98%   Weight: 174 lb (78.9 kg)   Height: 5\' 6"  (1.676 m)    Body mass index is 28.08 kg/m.   Physical Exam Vitals reviewed.  Constitutional:      Appearance: Normal appearance.  HENT:     Head: Normocephalic.  Eyes:     Extraocular Movements: Extraocular movements intact.     Pupils: Pupils are equal, round, and reactive to light.  Cardiovascular:     Rate and Rhythm: Normal rate and regular rhythm.     Pulses: Normal pulses.     Heart sounds: Murmur (Aortic stenosis murmur) heard.    Pulmonary:     Effort: Pulmonary effort is normal.     Breath sounds: Normal breath sounds.  Musculoskeletal:        General: Normal range of motion.     Cervical back: Normal range of motion and neck supple.  Skin:    General: Skin is warm and dry.     Capillary Refill: Capillary refill takes  less than 2 seconds.  Neurological:     General: No focal deficit present.     Mental Status: He is alert and oriented to person, place, and time.  Psychiatric:        Mood and Affect: Mood normal.        Behavior: Behavior normal.     A total of 30 minutes was spent with the patient, greater than 50% of which was in counseling/coordination of care regarding establishing care with me, review of multiple chronic medical problems, review of all medications, review of most recent office visit notes, review of most recent specialists office visit notes, review of most recent imaging including lumbar MRI, review of most recent blood work results, education on nutrition, health maintenance items, treatment of GERD with pantoprazole and need for GI referral and possible upper endoscopy, prognosis, documentation, need for follow-up.  ASSESSMENT & PLAN: Daruis was seen today for transitions of care.  Diagnoses and all orders for this visit:  Encounter to establish care  Primary hypertension  Stage 3a chronic kidney disease ([place])  Peripheral vascular disease ([place])  Coronary artery disease involving native coronary artery of native heart without angina pectoris  Pituitary adenoma ([place])  History of glaucoma  Dyslipidemia  Gastroesophageal reflux disease without esophagitis -     pantoprazole (PROTONIX) 40 MG tablet; Take 1 tablet (40 mg total) by mouth daily. -     Ambulatory referral to Gastroenterology  Aortic valve stenosis, etiology of cardiac valve disease unspecified  Statin myopathy  Insomnia, unspecified type  Neurogenic claudication [facility])    Patient Instructions       If you have lab work done today you will be contacted with your lab results within the next 2 weeks.  If you have not heard from [place] then please contact us. The fastest way to get your results is to register for My Chart.   IF you received an x-ray today,  you will receive an invoice from [facility]  Radiology. Please contact [facility] Radiology at [phone] with questions or concerns regarding your invoice.   IF you received labwork today, you will receive an invoice from [facility]. Please contact [facility] at [phone] with questions or concerns regarding your invoice.   Our billing staff will not be able to assist you with questions regarding bills from these companies.  You will be contacted with the lab results as soon as they are available. The fastest way to get your results is to activate your My Chart account. Instructions are located on the last page of this paperwork. If you have not heard from [place] regarding the results in 2 weeks, please contact this office.     Health Maintenance After Age 69 After age 40, you are at a higher risk for certain long-term diseases and infections as well as injuries from falls. Falls are a major cause of broken bones and head injuries in people who are older than age 44. Getting regular preventive care can help to keep you healthy and well. Preventive care includes getting regular testing and making lifestyle changes as recommended by your health care provider. Talk with your health care provider about:  Which screenings and tests you should have. A screening is a test that checks for a disease when you have no symptoms.  A diet and exercise plan that is right for you. What should I know about screenings and tests to prevent falls? Screening and testing are the best ways to find a health problem early. Early diagnosis and treatment give you the best chance of managing medical conditions that are common after age 53. Certain conditions and lifestyle choices may make you more likely to have a fall. Your health care provider may recommend:  Regular vision checks. Poor vision and conditions such as cataracts can make you more likely to have a fall. If you wear glasses, make sure to get your prescription updated if your vision changes.  Medicine  review. Work with your health care provider to regularly review all of the medicines you are taking, including over-the-counter medicines. Ask your health care provider about any side effects that may make you more likely to have a fall. Tell your health care provider if any medicines that you take make you feel dizzy or sleepy.  Osteoporosis screening. Osteoporosis is a condition that causes the bones to get weaker. This can make the bones weak and cause them to break more easily.  Blood pressure screening. Blood pressure changes and medicines to control blood pressure can make you feel dizzy.  Strength and balance checks. Your health care provider may recommend certain tests to check your strength and balance while standing, walking, or changing positions.  Foot health exam. Foot pain and numbness, as well as not wearing proper footwear, can make you more likely to have a fall.  Depression screening. You may be more likely to have a fall if you have a fear of falling, feel emotionally low, or feel unable to do activities that you used to do.  Alcohol use screening. Using too much alcohol can affect your balance and may make you more likely to have a fall. What actions can I take to lower my risk of falls? General instructions  Talk with your health care provider about your risks for falling. Tell your health care provider if: ? You fall. Be sure to tell your health care provider about all falls, even ones that seem minor. ? You  feel dizzy, sleepy, or off-balance.  Take over-the-counter and prescription medicines only as told by your health care provider. These include any supplements.  Eat a healthy diet and maintain a healthy weight. A healthy diet includes low-fat dairy products, low-fat (lean) meats, and fiber from whole grains, beans, and lots of fruits and vegetables. Home safety  Remove any tripping hazards, such as rugs, cords, and clutter.  Install safety equipment such as grab  bars in bathrooms and safety rails on stairs.  Keep rooms and walkways well-lit. Activity  Follow a regular exercise program to stay fit. This will help you maintain your balance. Ask your health care provider what types of exercise are appropriate for you.  If you need a cane or walker, use it as recommended by your health care provider.  Wear supportive shoes that have nonskid soles.   Lifestyle  Do not drink alcohol if your health care provider tells you not to drink.  If you drink alcohol, limit how much you have: ? 0-1 drink a day for women. ? 0-2 drinks a day for men.  Be aware of how much alcohol is in your drink. In the [place] one drink equals one typical bottle of beer (12 oz), one-half glass of wine (5 oz), or one shot of hard liquor (1 oz).  Do not use any products that contain nicotine or tobacco, such as cigarettes and e-cigarettes. If you need help quitting, ask your health care provider. Summary  Having a healthy lifestyle and getting preventive care can help to protect your health and wellness after age 72.  Screening and testing are the best way to find a health problem early and help you avoid having a fall. Early diagnosis and treatment give you the best chance for managing medical conditions that are more common for people who are older than age 13.  Falls are a major cause of broken bones and head injuries in people who are older than age 25. Take precautions to prevent a fall at home.  Work with your health care provider to learn what changes you can make to improve your health and wellness and to prevent falls. This information is not intended to replace advice given to you by your health care provider. Make sure you discuss any questions you have with your health care provider. Document Revised: 05/06/2018 Document Reviewed: 11/26/2016 Elsevier Patient Education  2021 Elsevier Inc.      Agustina Caroli, MD [facility]

## 2020-02-20 ENCOUNTER — Ambulatory Visit: Payer: Medicare Other | Admitting: Interventional Cardiology

## 2020-02-20 ENCOUNTER — Other Ambulatory Visit: Payer: Self-pay

## 2020-02-20 ENCOUNTER — Encounter: Payer: Self-pay | Admitting: Interventional Cardiology

## 2020-02-20 VITALS — BP 130/72 | HR 71 | Ht 66.0 in | Wt 175.0 lb

## 2020-02-20 DIAGNOSIS — D352 Benign neoplasm of pituitary gland: Secondary | ICD-10-CM

## 2020-02-20 DIAGNOSIS — E7849 Other hyperlipidemia: Secondary | ICD-10-CM

## 2020-02-20 DIAGNOSIS — I350 Nonrheumatic aortic (valve) stenosis: Secondary | ICD-10-CM

## 2020-02-20 DIAGNOSIS — I739 Peripheral vascular disease, unspecified: Secondary | ICD-10-CM | POA: Diagnosis not present

## 2020-02-20 DIAGNOSIS — G720 Drug-induced myopathy: Secondary | ICD-10-CM

## 2020-02-20 DIAGNOSIS — I2510 Atherosclerotic heart disease of native coronary artery without angina pectoris: Secondary | ICD-10-CM

## 2020-02-20 DIAGNOSIS — Z7189 Other specified counseling: Secondary | ICD-10-CM

## 2020-02-20 DIAGNOSIS — I10 Essential (primary) hypertension: Secondary | ICD-10-CM

## 2020-02-20 DIAGNOSIS — T466X5A Adverse effect of antihyperlipidemic and antiarteriosclerotic drugs, initial encounter: Secondary | ICD-10-CM

## 2020-02-20 LAB — BASIC METABOLIC PANEL
BUN/Creatinine Ratio: 12 (ref 10–24)
BUN: 18 mg/dL (ref 8–27)
CO2: 25 mmol/L (ref 20–29)
Calcium: 9.4 mg/dL (ref 8.6–10.2)
Chloride: 104 mmol/L (ref 96–106)
Creatinine, Ser: 1.51 mg/dL — ABNORMAL HIGH (ref 0.76–1.27)
GFR calc Af Amer: 53 mL/min/{1.73_m2} — ABNORMAL LOW (ref >59)
GFR calc non Af Amer: 45 mL/min/{1.73_m2} — ABNORMAL LOW (ref >59)
Glucose: 102 mg/dL — ABNORMAL HIGH (ref 65–99)
Potassium: 4.5 mmol/L (ref 3.5–5.2)
Sodium: 142 mmol/L (ref 134–144)

## 2020-02-20 LAB — HEPATIC FUNCTION PANEL
ALT: 24 IU/L (ref 0–44)
AST: 22 IU/L (ref 0–40)
Albumin: 4.5 g/dL (ref 3.7–4.7)
Alkaline Phosphatase: 87 IU/L (ref 44–121)
Bilirubin Total: 0.4 mg/dL (ref 0.0–1.2)
Bilirubin, Direct: <0.1 mg/dL (ref 0.00–0.40)
Total Protein: 6.7 g/dL (ref 6.0–8.5)

## 2020-02-20 LAB — LIPID PANEL
Chol/HDL Ratio: 4.2 ratio (ref 0.0–5.0)
Cholesterol, Total: 143 mg/dL (ref 100–199)
HDL: 34 mg/dL — ABNORMAL LOW (ref >39)
LDL Chol Calc (NIH): 88 mg/dL (ref 0–99)
Triglycerides: 117 mg/dL (ref 0–149)
VLDL Cholesterol Cal: 21 mg/dL (ref 5–40)

## 2020-02-20 NOTE — Patient Instructions (Signed)
Medication Instructions:  Your physician recommends that you continue on your current medications as directed. Please refer to the Current Medication list given to you today.  *If you need a refill on your cardiac medications before your next appointment, please call your pharmacy*   Lab Work: Lipid, Liver and BMET today  If you have labs (blood work) drawn today and your tests are completely normal, you will receive your results only by: Marland Kitchen MyChart Message (if you have MyChart) OR . A paper copy in the mail If you have any lab test that is abnormal or we need to change your treatment, we will call you to review the results.   Testing/Procedures: Your physician has requested that you have an echocardiogram. Echocardiography is a painless test that uses sound waves to create images of your heart. It provides your doctor with information about the size and shape of your heart and how well your heart's chambers and valves are working. This procedure takes approximately one hour. There are no restrictions for this procedure.    Follow-Up: At [facility], you and your health needs are our priority.  As part of our continuing mission to provide you with exceptional heart care, we have created designated Provider Care Teams.  These Care Teams include your primary Cardiologist (physician) and Advanced Practice Providers (APPs -  Physician Assistants and Nurse Practitioners) who all work together to provide you with the care you need, when you need it.  We recommend signing up for the patient portal called "MyChart".  Sign up information is provided on this After Visit Summary.  MyChart is used to connect with patients for Virtual Visits (Telemedicine).  Patients are able to view lab/test results, encounter notes, upcoming appointments, etc.  Non-urgent messages can be sent to your provider as well.   To learn more about what you can do with MyChart, go to [email or web address].    Your next  appointment:   6 month(s)  The format for your next appointment:   In Person  Provider:   You may see Sinclair Grooms, MD or one of the following Advanced Practice Providers on your designated Care Team:    Cecilie Kicks, NP  Kathyrn Drown, NP    Other Instructions

## 2020-03-12 ENCOUNTER — Other Ambulatory Visit: Payer: Self-pay

## 2020-03-12 ENCOUNTER — Ambulatory Visit (HOSPITAL_COMMUNITY): Payer: Medicare Other | Attending: Cardiology

## 2020-03-12 DIAGNOSIS — I2510 Atherosclerotic heart disease of native coronary artery without angina pectoris: Secondary | ICD-10-CM | POA: Insufficient documentation

## 2020-03-12 DIAGNOSIS — I350 Nonrheumatic aortic (valve) stenosis: Secondary | ICD-10-CM | POA: Insufficient documentation

## 2020-03-12 LAB — ECHOCARDIOGRAM COMPLETE
AR max vel: 0.81 cm2
AV Area VTI: 0.97 cm2
AV Area mean vel: 0.91 cm2
AV Mean grad: 25.3 mmHg
AV Peak grad: 49.8 mmHg
Ao pk vel: 3.53 m/s
Area-P 1/2: 2.29 cm2
S' Lateral: 2.4 cm

## 2020-04-05 ENCOUNTER — Ambulatory Visit: Payer: Medicare Other | Admitting: Neurology

## 2020-04-16 ENCOUNTER — Other Ambulatory Visit: Payer: Self-pay | Admitting: *Deleted

## 2020-04-16 MED ORDER — METOPROLOL SUCCINATE ER 25 MG PO TB24: 25.0000 mg | ORAL_TABLET | Freq: Every day | ORAL | 1 refills | Status: DC

## 2020-06-04 ENCOUNTER — Telehealth: Payer: Self-pay | Admitting: Interventional Cardiology

## 2020-06-04 NOTE — Telephone Encounter (Signed)
New Message:    Pt went to the dentist this morning and his blood was up:   Pt c/o BP issue: STAT if pt c/o blurred vision, one-sided weakness or slurred speech  1. What are your last 5 BP readings?  He could not remember the reading of his blood pressure  2. Are you having any other symptoms (ex. Dizziness, headache, blurred vision, passed out)?no  3. What is your BP issue? He says blood pressure was high this morning, he wants to know what was his blood ressure reading his last office visit

## 2020-06-04 NOTE — Telephone Encounter (Signed)
Spoke with pt and he states BP was elevated at the dentist today.  Wife was on the phone with the dentist office and found out it was 154/75.  Advised pt this is elevated but not terribly and we expect it to be high at the dentist/doctor.  Pt then put his wife on the phone.  Advised wife to monitor his BP at home and that goal BP is 130/80.  Advised to call if consistently higher than that. Wife appreciative for call.

## 2020-06-05 IMAGING — DX DG CHEST 2V
2 series · 2 of 2 positions shown · non-contrast
Comparison: Chest radiograph December 03, 2016

CLINICAL DATA: Cough and feeling unwell for four months.

EXAM:
CHEST - 2 VIEW

[chest pa]
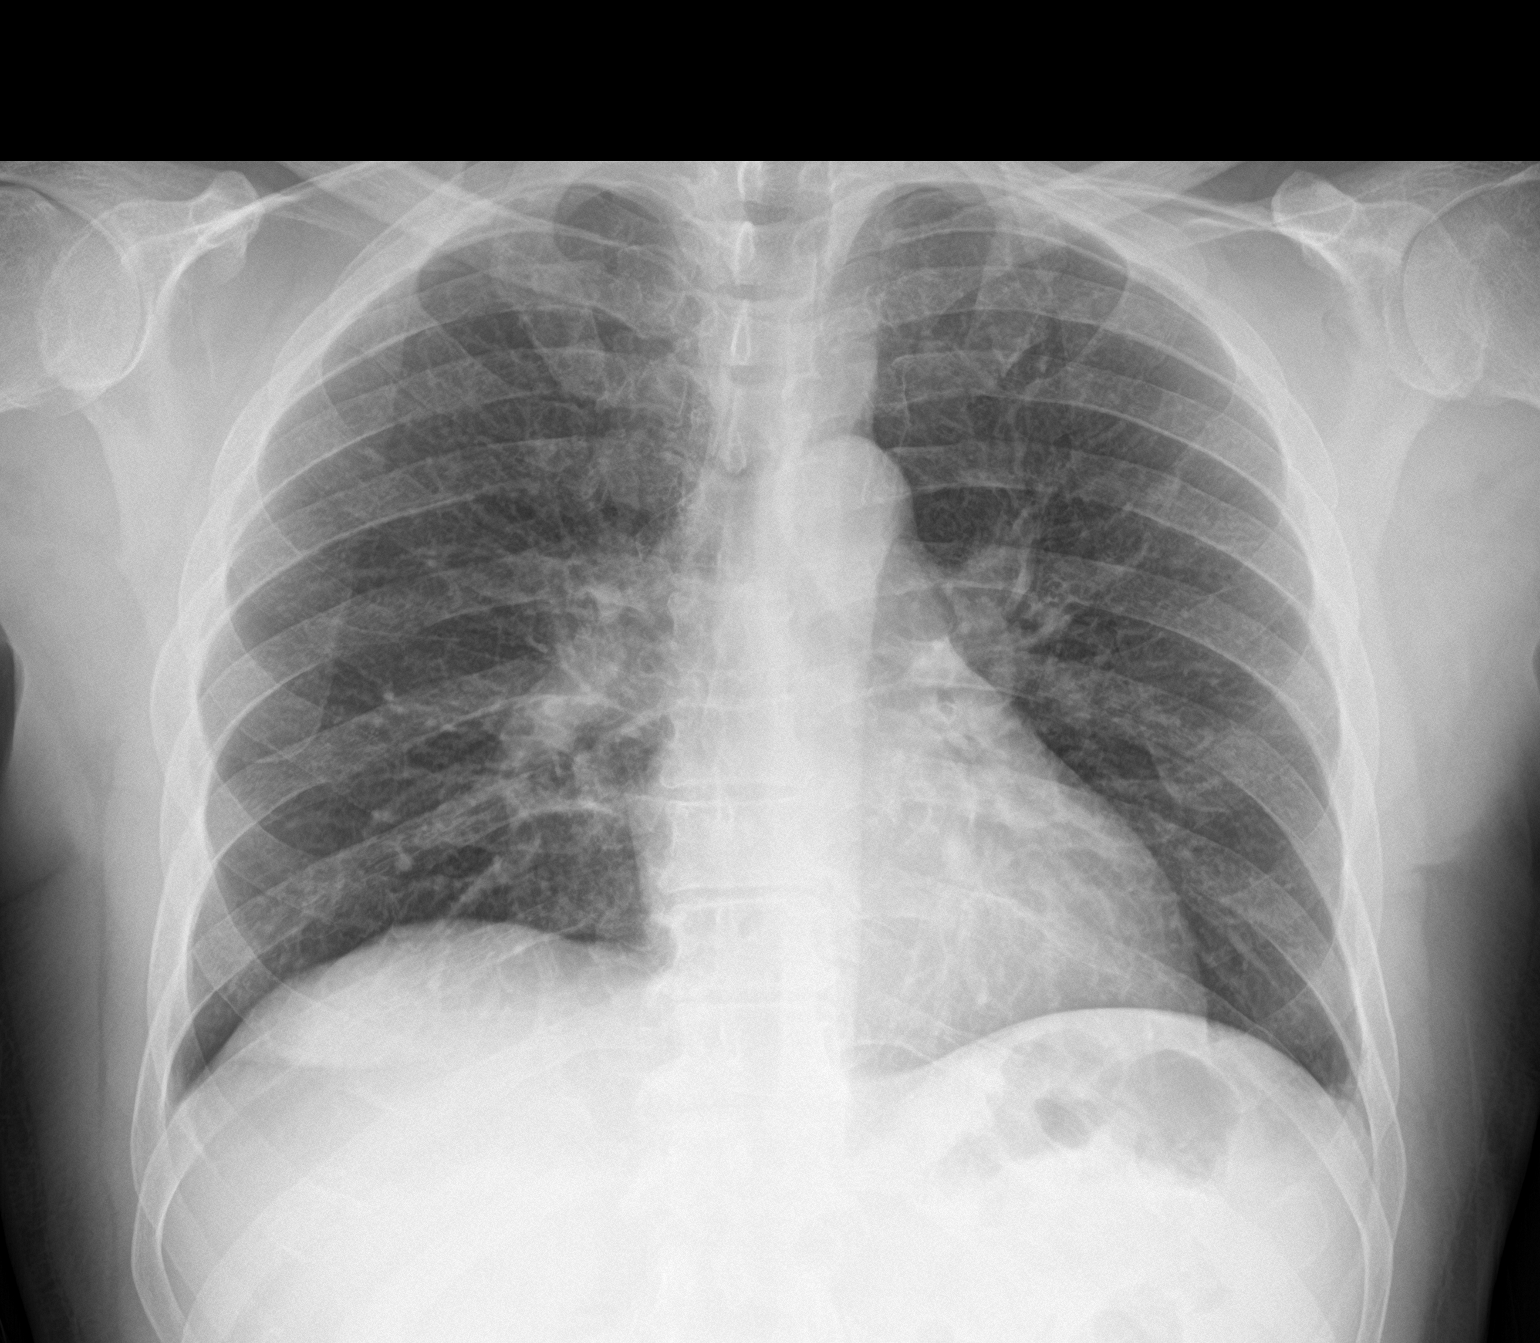

[chest lat]
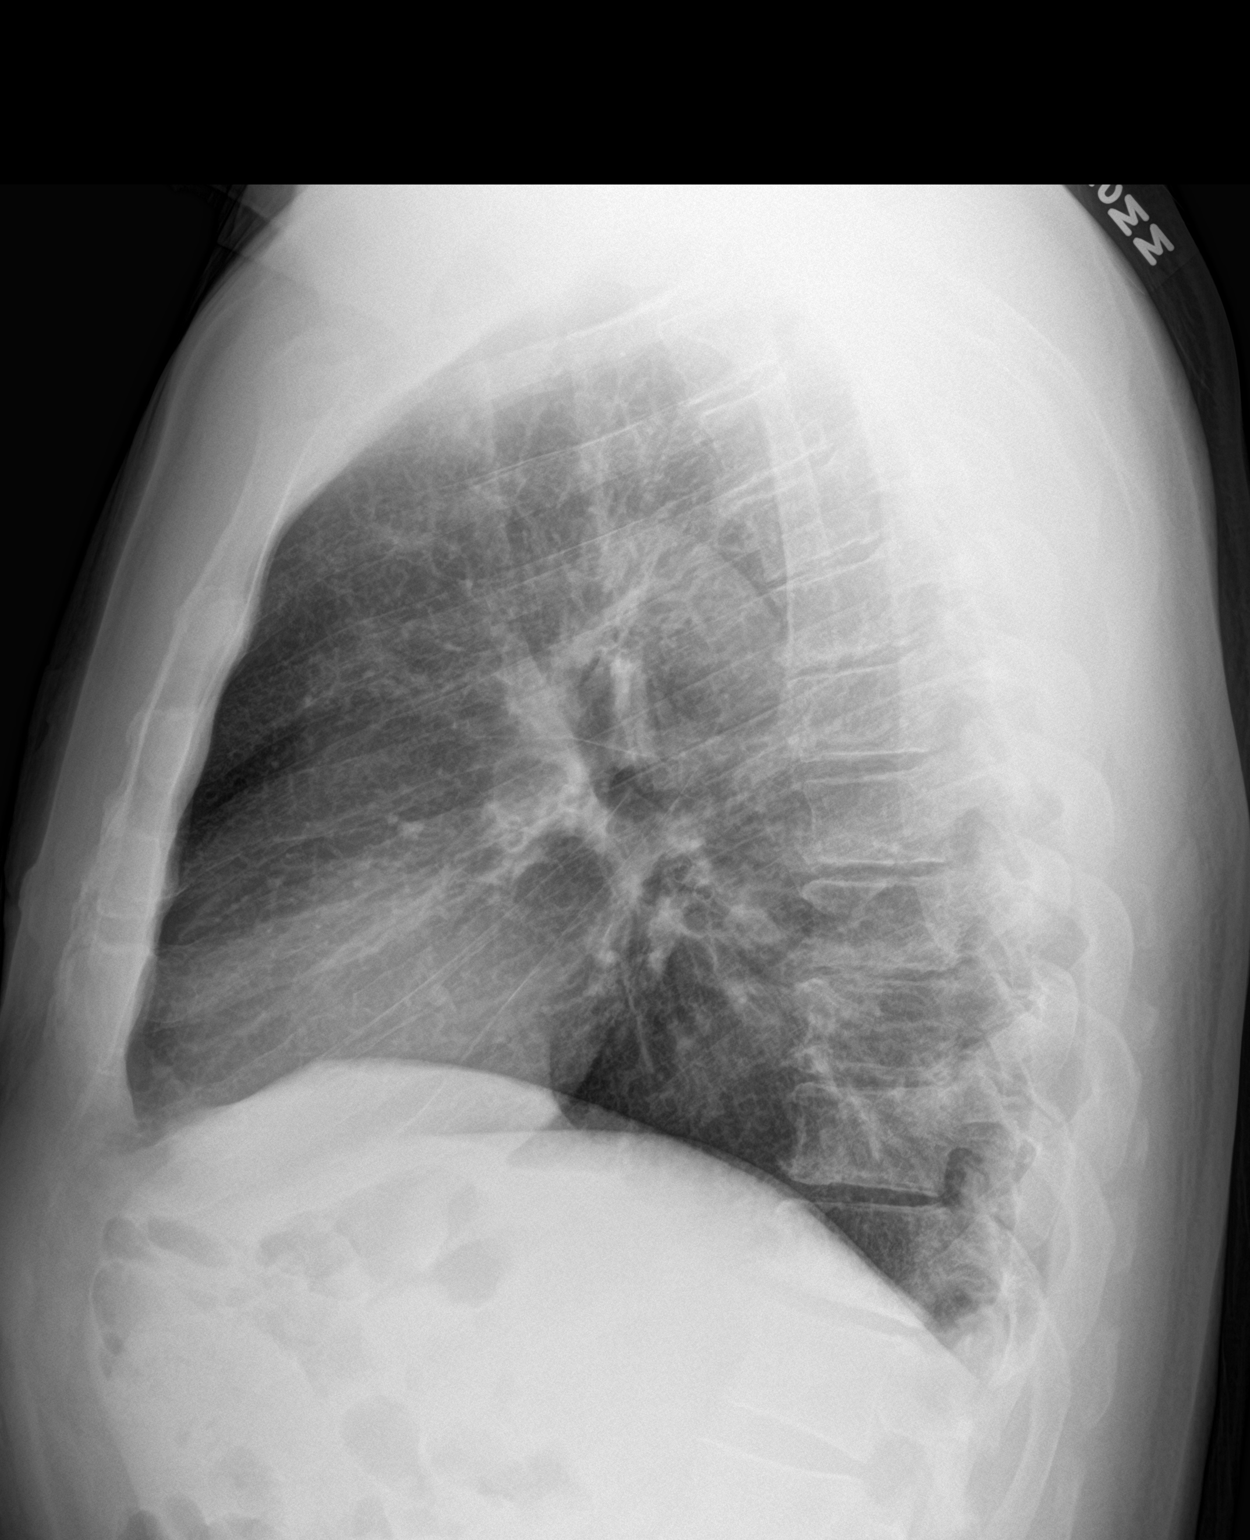

[2 of 2 positions shown; findings below may reference images not displayed]

FINDINGS: Cardiomediastinal silhouette is normal. No pleural effusions or
focal consolidations. Trachea projects midline and there is no
pneumothorax. Soft tissue planes and included osseous structures are
non-suspicious. Mild thoracic spondylosis. Old mild T12 compression
fracture.
IMPRESSION: No active cardiopulmonary process.

## 2020-06-29 ENCOUNTER — Other Ambulatory Visit: Payer: Self-pay | Admitting: Emergency Medicine

## 2020-06-29 DIAGNOSIS — K219 Gastro-esophageal reflux disease without esophagitis: Secondary | ICD-10-CM

## 2020-07-21 ENCOUNTER — Other Ambulatory Visit: Payer: Self-pay | Admitting: Emergency Medicine

## 2020-07-21 DIAGNOSIS — G4700 Insomnia, unspecified: Secondary | ICD-10-CM

## 2020-08-15 ENCOUNTER — Ambulatory Visit: Payer: Self-pay | Admitting: Emergency Medicine

## 2020-09-10 ENCOUNTER — Ambulatory Visit: Payer: Medicare Other | Admitting: Family

## 2020-09-28 ENCOUNTER — Other Ambulatory Visit: Payer: Self-pay | Admitting: Interventional Cardiology

## 2020-10-12 ENCOUNTER — Other Ambulatory Visit: Payer: Self-pay | Admitting: Interventional Cardiology

## 2020-10-30 ENCOUNTER — Telehealth: Payer: Self-pay | Admitting: Neurology

## 2020-10-30 NOTE — Telephone Encounter (Signed)
Patient called with his wife Robert Estes and said he is in quite a lot of pain.  He was last seen 01/16/20.  Patient's wife explained patient requesting something to help with his pain in his back to last until his next appointment 01/16/21 with Dr. Posey Pronto.  Patient has some concern about his kidneys and liver and doesn't want a medicine that will effect those organs.  Patient has been added to the wait list.  [facility] on [address]

## 2020-10-30 NOTE — Telephone Encounter (Signed)
Voicemail at  50, patient needs to contact PCP, pain meds not given here at Practice, mail is full.

## 2020-10-31 NOTE — Telephone Encounter (Signed)
I left voicemail on phone to contact PCP. To call back if he had further concerns.

## 2020-11-05 ENCOUNTER — Ambulatory Visit (INDEPENDENT_AMBULATORY_CARE_PROVIDER_SITE_OTHER): Payer: Medicare Other

## 2020-11-05 ENCOUNTER — Other Ambulatory Visit: Payer: Self-pay

## 2020-11-05 ENCOUNTER — Encounter: Payer: Self-pay | Admitting: Podiatry

## 2020-11-05 ENCOUNTER — Ambulatory Visit: Payer: Medicare Other | Admitting: Podiatry

## 2020-11-05 DIAGNOSIS — M2041 Other hammer toe(s) (acquired), right foot: Secondary | ICD-10-CM | POA: Diagnosis not present

## 2020-11-05 DIAGNOSIS — M21611 Bunion of right foot: Secondary | ICD-10-CM

## 2020-11-05 DIAGNOSIS — M21612 Bunion of left foot: Secondary | ICD-10-CM | POA: Diagnosis not present

## 2020-11-05 DIAGNOSIS — M2042 Other hammer toe(s) (acquired), left foot: Secondary | ICD-10-CM

## 2020-11-05 NOTE — Progress Notes (Signed)
  Subjective:  Patient ID: Robert Estes, male    DOB: 07-11-1945,  MRN: [id number]  Chief Complaint  Patient presents with   Hammer Toe    75 y.o. male presents with the above complaint. History confirmed with patient.  The big toe is drifting over and rubbing on the second toe on both feet but the left is worse.  He also has pain and pressure on the inside of the big toe because of his work trips over.  He experiences cramps in the inside the arch.  He also has spine issues causing pain shooting down into the toe on the right side  Objective:  Physical Exam: warm, good capillary refill, no trophic changes or ulcerative lesions, normal DP and PT pulses, and normal sensory exam.  Bilaterally with left worse than right he has moderate hallux valgus with abduction of the hallux and medial pinch callus, tightness of the abductor hallucis and mild hammertoe formation of the second toe where the big toe is overlapping  Radiographs: Multiple views x-ray of both feet: Left worse than right moderate hallux valgus deformity and hammertoes, mild arthritic changes in the bunion Assessment:   1. Bilateral bunions   2. Hammertoe of right foot   3. Hammertoe of left foot      Plan:  Patient was evaluated and treated and all questions answered.  Discussed the etiology and treatment including surgical and non surgical treatment for painful bunions and hammertoes.  We discussed non surgical treatment including shoe gear changes and padding.  He desires surgical intervention.  I think likely he will end up needing surgery at some point.  He would be likely a good candidate for minimally invasive approach with screw fixation with double osteotomy.  For now he will try padding and offloading with bunion shields and guards and splints which I showed him a few of these on [place].  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  He will try these and  continue wearing good comfortable wide shoes and will see me back in about 6 to 8 weeks for reevaluation.  Regarding his right foot pain I think a lot of this is neurogenic and he is currently being managed by Dr. Posey Pronto with this and has referral to spine surgery as well.  Return in about 7 weeks (around 12/25/2020) for f/u on Bunions and hammertoes.

## 2020-11-05 NOTE — Patient Instructions (Signed)
Look for bunion guards, bunion correctors, or bunion splints on [place] or in the foot care section at the shoe store or pharmacy to see if this helps. Continue to wear wide comfortable shoes.

## 2020-12-25 ENCOUNTER — Ambulatory Visit: Payer: Medicare Other | Admitting: Podiatry

## 2021-01-09 ENCOUNTER — Other Ambulatory Visit: Payer: Self-pay | Admitting: *Deleted

## 2021-01-09 DIAGNOSIS — I350 Nonrheumatic aortic (valve) stenosis: Secondary | ICD-10-CM

## 2021-01-10 ENCOUNTER — Other Ambulatory Visit: Payer: Self-pay | Admitting: Interventional Cardiology

## 2021-01-16 ENCOUNTER — Ambulatory Visit: Payer: Medicare Other | Admitting: Neurology

## 2021-01-17 ENCOUNTER — Telehealth: Payer: Self-pay | Admitting: Interventional Cardiology

## 2021-01-17 NOTE — Telephone Encounter (Signed)
Pt's wife left a message stating they were able to get pt set up for [facility] and they were very appreciative for Hale[facility]'s assistance.

## 2021-01-17 NOTE — Telephone Encounter (Signed)
Called and spoke w/pts wife ann and stated that they would have to contact [employer] as I tried to complete the renewal online but for some reason it wouldn't find them. Pt's spouse voiced understanding.   [phone] If you're a patient or family member and you're ready to apply, you have two options: Apply online here. Apply by calling [phone], Monday-Friday, 9 am to 5 pm (EST)

## 2021-01-17 NOTE — Telephone Encounter (Signed)
Pt c/o medication issue:  1. Name of Medication: REPATHA SURECLICK 032 MG/ML SOAJ  2. How are you currently taking this medication (dosage and times per day)? AS DIRECTED  3. Are you having a reaction (difficulty breathing--STAT)? NO  4. What is your medication issue? PT WANTS TO KNOW IF HE CAN GET THIS MEDICINE CHEAPER AT THE PHARMACY OR IF HE CAN GET SOME SAMPLES. PT SAID HE NORMALLY GETS THIS MEDICINE FREE OF CHARGE AND HE WENT TO THE PHARMACY RECENTLY AND IT IS NOW $170.00

## 2021-02-19 ENCOUNTER — Other Ambulatory Visit: Payer: Self-pay

## 2021-02-19 MED ORDER — NITROGLYCERIN 0.4 MG SL SUBL: 0.4000 mg | SUBLINGUAL_TABLET | SUBLINGUAL | 3 refills | Status: DC | PRN

## 2021-03-25 ENCOUNTER — Other Ambulatory Visit: Payer: Self-pay

## 2021-03-25 ENCOUNTER — Ambulatory Visit (HOSPITAL_COMMUNITY): Payer: Medicare Other | Attending: Cardiology

## 2021-03-25 DIAGNOSIS — I350 Nonrheumatic aortic (valve) stenosis: Secondary | ICD-10-CM | POA: Diagnosis not present

## 2021-03-25 LAB — ECHOCARDIOGRAM COMPLETE
AR max vel: 0.67 cm2
AV Area VTI: 0.76 cm2
AV Area mean vel: 0.63 cm2
AV Mean grad: 24 mmHg
AV Peak grad: 43 mmHg
Ao pk vel: 3.28 m/s
Area-P 1/2: 3.61 cm2
P 1/2 time: 663 msec
S' Lateral: 2.7 cm

## 2021-03-26 ENCOUNTER — Other Ambulatory Visit (HOSPITAL_COMMUNITY): Payer: Medicare Other

## 2021-03-27 NOTE — Progress Notes (Deleted)
?Cardiology Office Note:   ? ?Date:  03/27/2021  ? ?ID:  Robert Estes, DOB 1946/01/25, MRN [id number] ? ?PCP:  Horald Pollen, MD  ?Cardiologist:  Sinclair Grooms, MD  ? ?Referring MD: Horald Pollen, *  ? ?No chief complaint on file. ? ? ?History of Present Illness:   ? ?Robert Estes is a 76 y.o. male with a hx of  CAD with prior stent implantation RCA and circumflex 2010, primary hypertension, probable bicuspid aortic valve with mild bicuspid Aortic valve with severe stenosis (02/2021). ? ? ? ?*** ? ?Past Medical History:  ?Diagnosis Date  ? Arthritis   ? "hands, knees, elbows, fingers" (01/25/2013)  ? Coronary artery disease   ? HX OF HEART STENTS X 2   2010--DR. Linard Millers IS PT'S CARDIOLOGIST, MILD AORTIC STENOSIS  ? Dizziness   ? Enlarged prostate   ? [place]  ? Exertional shortness of breath   ? GERD (gastroesophageal reflux disease)   ? OTC PREVACID AS NEEDED  ? Glaucoma   ? BOTH EYES  ? Gout   ? Heart murmur   ? Hyperlipidemia   ? Hypertension   ? Hypertension   ? Iron deficiency anemia   ? hx  ? Migraine   ? "monthly" (01/25/2013)  ? Peripheral vascular disease ([place])   ? CLAUDICATION  ? ? ?Past Surgical History:  ?Procedure Laterality Date  ? CHOLECYSTECTOMY N/A 01/25/2013  ? Procedure: LAPAROSCOPIC CHOLECYSTECTOMY WITH INTRAOPERATIVE CHOLANGIOGRAM;  Surgeon: [name], MD;  Location: [facility];  Service: General;  Laterality: N/A;  ? CORONARY ANGIOPLASTY WITH STENT PLACEMENT  11/27/2008  ? "2" (01/25/2013)  ? EUS  12/24/2011  ? Procedure: FULL UPPER ENDOSCOPIC ULTRASOUND (EUS) RADIAL;  Surgeon: [name], MD;  Location: [facility];  Service: Endoscopy;  Laterality: N/A;  ? INCISION AND DRAINAGE    ? thrombosed hemorrhoid  ? LAPAROSCOPIC CHOLECYSTECTOMY  01/25/2013  ? ? ?Current Medications: ?No outpatient medications have been marked as taking for the 03/29/21 encounter (Appointment) with Belva Crome, MD.  ?  ? ?Allergies:   Codeine  ? ?Social History   ? ?Socioeconomic History  ? Marital status: Married  ?  Spouse name: Not on file  ? Number of children: Not on file  ? Years of education: Not on file  ? Highest education level: Not on file  ?Occupational History  ? Not on file  ?Tobacco Use  ? Smoking status: Never  ? Smokeless tobacco: Never  ?Vaping Use  ? Vaping Use: Never used  ?Substance and Sexual Activity  ? Alcohol use: Yes  ?  Comment: Drinks a beer or glass of wine 1-2 times per month   ? Drug use: No  ? Sexual activity: Never  ?Other Topics Concern  ? Not on file  ?Social History Narrative  ? Right Handed  ? Lives in a one story home  ? ?Social Determinants of Health  ? ?Financial Resource Strain: Not on file  ?Food Insecurity: Not on file  ?Transportation Needs: Not on file  ?Physical Activity: Not on file  ?Stress: Not on file  ?Social Connections: Not on file  ?  ? ?Family History: ?The patient's family history includes Breast cancer in his mother; Cancer in his mother; Heart attack in his father; Heart disease in his father; Heart failure in his mother. ? ?ROS:   ?Please see the history of present illness.    ?*** All other systems reviewed and are negative. ? ?  EKGs/Labs/Other Studies Reviewed:   ? ?The following studies were reviewed today: ? ?2 D Doppler ECHOCARDIOGRAM 03/25/2021 ?IMPRESSIONS  ? 1. Fusion of left and right coronary cusps; moderate to severe AS (mean  ?gradient 24 mmHg suggestive of moderate AS similar to 03/12/20); however  ?AVA 0.7 cm2 and DI 0.25 suggestive of severe AS.  ? 2. Left ventricular ejection fraction, by estimation, is 60 to 65%. The  ?left ventricle has normal function. The left ventricle has no regional  ?wall motion abnormalities. Left ventricular diastolic parameters are  ?consistent with Grade I diastolic  ?dysfunction (impaired relaxation). Elevated left atrial pressure.  ? 3. Right ventricular systolic function is normal. The right ventricular  ?size is normal.  ? 4. The mitral valve is normal in structure. Mild  mitral valve  ?regurgitation. No evidence of mitral stenosis.  ? 5. The aortic valve is normal in structure. Aortic valve regurgitation is  ?mild. Moderate to severe aortic valve stenosis.  ? 6. The inferior vena cava is normal in size with greater than 50%  ?respiratory variability, suggesting right atrial pressure of 3 mmHg.  ? ? ?EKG:  EKG *** ? ?Recent Labs: ?No results found for requested labs within last 8760 hours.  ?Recent Lipid Panel ?   ?Component Value Date/Time  ? CHOL 143 02/20/2020 0909  ? TRIG 117 02/20/2020 0909  ? HDL 34 (L) 02/20/2020 0909  ? CHOLHDL 4.2 02/20/2020 0909  ? CHOLHDL 5.5 (H) 12/14/2014 0901  ? VLDL 17 12/14/2014 0901  ? Almedia 88 02/20/2020 0909  ? ? ?Physical Exam:   ? ?VS:  There were no vitals taken for this visit.   ? ?Wt Readings from Last 3 Encounters:  ?02/20/20 175 lb (79.4 kg)  ?02/16/20 174 lb (78.9 kg)  ?01/16/20 173 lb (78.5 kg)  ?  ? ?GEN: ***. No acute distress ?HEENT: Normal ?NECK: No JVD. ?LYMPHATICS: No lymphadenopathy ?CARDIAC: *** murmur. RRR *** gallop, or edema. ?VASCULAR: *** Normal Pulses. No bruits. ?RESPIRATORY:  Clear to auscultation without rales, wheezing or rhonchi  ?ABDOMEN: Soft, non-tender, non-distended, No pulsatile mass, ?MUSCULOSKELETAL: No deformity  ?SKIN: Warm and dry ?NEUROLOGIC:  Alert and oriented x 3 ?PSYCHIATRIC:  Normal affect  ? ?ASSESSMENT:   ? ?1. Severe aortic stenosis   ?2. Coronary artery disease involving native coronary artery of native heart without angina pectoris   ?3. Essential hypertension   ?4. Peripheral vascular disease ([place])   ?5. Pituitary adenoma ([place])   ?6. Other hyperlipidemia   ?7. Statin myopathy   ? ?PLAN:   ? ?In order of problems listed above: ? ?*** ? ? ?Medication Adjustments/Labs and Tests Ordered: ?Current medicines are reviewed at length with the patient today.  Concerns regarding medicines are outlined above.  ?No orders of the defined types were placed in this encounter. ? ?No orders of the defined types  were placed in this encounter. ? ? ?There are no Patient Instructions on file for this visit.  ? ?Signed, ?Sinclair Grooms, MD  ?03/27/2021 9:05 PM    ?[place] ?

## 2021-03-29 ENCOUNTER — Ambulatory Visit: Payer: Medicare Other | Admitting: Interventional Cardiology

## 2021-03-29 DIAGNOSIS — I350 Nonrheumatic aortic (valve) stenosis: Secondary | ICD-10-CM

## 2021-03-29 DIAGNOSIS — D352 Benign neoplasm of pituitary gland: Secondary | ICD-10-CM

## 2021-03-29 DIAGNOSIS — I2510 Atherosclerotic heart disease of native coronary artery without angina pectoris: Secondary | ICD-10-CM

## 2021-03-29 DIAGNOSIS — I10 Essential (primary) hypertension: Secondary | ICD-10-CM

## 2021-03-29 DIAGNOSIS — I739 Peripheral vascular disease, unspecified: Secondary | ICD-10-CM

## 2021-03-29 DIAGNOSIS — T466X5A Adverse effect of antihyperlipidemic and antiarteriosclerotic drugs, initial encounter: Secondary | ICD-10-CM

## 2021-03-29 DIAGNOSIS — E7849 Other hyperlipidemia: Secondary | ICD-10-CM

## 2021-07-09 ENCOUNTER — Other Ambulatory Visit: Payer: Self-pay | Admitting: Interventional Cardiology

## 2021-07-09 MED ORDER — METOPROLOL SUCCINATE ER 25 MG PO TB24: ORAL_TABLET | ORAL | 0 refills | Status: DC

## 2021-07-09 NOTE — Addendum Note (Signed)
Addended by: Carter Kitten D on: 07/09/2021 09:34 AM   Modules accepted: Orders

## 2021-08-22 ENCOUNTER — Other Ambulatory Visit: Payer: Self-pay

## 2021-08-22 ENCOUNTER — Telehealth: Payer: Self-pay | Admitting: Interventional Cardiology

## 2021-08-22 MED ORDER — METOPROLOL SUCCINATE ER 25 MG PO TB24: ORAL_TABLET | ORAL | 0 refills | Status: DC

## 2021-08-22 NOTE — Telephone Encounter (Signed)
Pt's medication was sent to pt's pharmacy as requested. Confirmation received.  °

## 2021-08-22 NOTE — Telephone Encounter (Signed)
*  STAT* If patient is at the pharmacy, call can be transferred to refill team.   1. Which medications need to be refilled? (please list name of each medication and dose if known) metoprolol succinate (TOPROL-XL) 25 MG 24 hr tablet  2. Which pharmacy/location (including street and city if local pharmacy) is medication to be sent to? [facility] [phone] - [place], [place] AT [address]  3. Do they need a 30 day or 90 day supply? 90 day   Patient has appt on October 4th with Dr. Tamala Julian

## 2021-10-01 ENCOUNTER — Other Ambulatory Visit: Payer: Self-pay | Admitting: Pharmacist

## 2021-10-01 MED ORDER — REPATHA SURECLICK 140 MG/ML ~~LOC~~ SOAJ: SUBCUTANEOUS | 11 refills | Status: DC

## 2021-10-02 ENCOUNTER — Telehealth: Payer: Self-pay | Admitting: Interventional Cardiology

## 2021-10-02 NOTE — Telephone Encounter (Signed)
Pt c/o medication issue:  1. Name of Medication:  Evolocumab (REPATHA SURECLICK) 830 MG/ML SOAJ  2. How are you currently taking this medication (dosage and times per day)? As prescribed  3. Are you having a reaction (difficulty breathing--STAT)? No   4. What is your medication issue? Wife is calling stating the prescription cost was $135 when the patient tried to pick up his medication. He was advised to call regarding assistance he uses for cost. Please advise.

## 2021-10-03 NOTE — Telephone Encounter (Signed)
Patient's wife called stating the pharmacy told her they needed a group number. Please advise.

## 2021-10-03 NOTE — Telephone Encounter (Signed)
Gave [employer] information to spouse Claudell Kyle expires 11/23

## 2021-10-03 NOTE — Telephone Encounter (Signed)
Call pharmacy to give [facility] card information.   BIN [id number], PCN [id number], ID [id number].  Robert Estes still has $1800 until 12/17/21

## 2021-10-04 NOTE — Telephone Encounter (Signed)
Group Number: [id number]

## 2021-10-04 NOTE — Telephone Encounter (Signed)
Patient's wife following up, very thankful for [facility]. She states she got the group number and it worked. Again, appreciative and no further questions.

## 2021-10-04 NOTE — Telephone Encounter (Signed)
Left message for patient's spouse Webb Silversmith, providing Group number requested by pharmacy for Repatha refill.  Group Number: [id number]  [employer] card information.   BIN [id number], PCN [id number], ID [id number].  Fatima Sanger still has $1800 until 12/17/21

## 2021-10-29 NOTE — H&P (View-Only) (Signed)
Cardiology Office Note:    Date:  10/30/2021   ID:  Robert Estes [place], DOB 04-04-1945, MRN [id number]  PCP:  Horald Pollen, MD  Cardiologist:  Sinclair Grooms, MD   Referring MD: Horald Pollen, *   Chief Complaint  Patient presents with   Coronary Artery Disease   Loss of Consciousness   Cardiac Valve Problem    Aortic stenosis    History of Present Illness:    Robert Estes is a 76 y.o. male with a hx of CAD with prior stent implantation and RCA and circumflex 2010, hypertension, probable bicuspid aortic valve with significant aortic stenosis by echo February 2023, and left carotid bruit. Also has history of hyperlipidemia.  He has no symptoms of syncope, dyspnea, or angina.  He does know that there is aortic stenosis present.  This past February there were parameters suggesting severe aortic stenosis (dimensionless index 0.25 and calculated aortic valve area 0.7 cm)) however peak velocity was 3.78 m/s on the same study.  He has coronary disease but has not had any angina since the right coronary and circumflex were stented in 2010.  Past Medical History:  Diagnosis Date   Arthritis    "hands, knees, elbows, fingers" (01/25/2013)   Coronary artery disease    HX OF HEART STENTS X 2   2010--DR. Linard Millers IS PT'S CARDIOLOGIST, MILD AORTIC STENOSIS   Dizziness    Enlarged prostate    FLOMAX HELPS PT VOID   Exertional shortness of breath    GERD (gastroesophageal reflux disease)    OTC PREVACID AS NEEDED   Glaucoma    BOTH EYES   Gout    Heart murmur    Hyperlipidemia    Hypertension    Hypertension    Iron deficiency anemia    hx   Migraine    "monthly" (01/25/2013)   Peripheral vascular disease ([place])    CLAUDICATION    Past Surgical History:  Procedure Laterality Date   CHOLECYSTECTOMY N/A 01/25/2013   Procedure: LAPAROSCOPIC CHOLECYSTECTOMY WITH INTRAOPERATIVE CHOLANGIOGRAM;  Surgeon: [name], MD;  Location: [facility];  Service: General;   Laterality: N/A;   CORONARY ANGIOPLASTY WITH STENT PLACEMENT  11/27/2008   "2" (01/25/2013)   EUS  12/24/2011   Procedure: FULL UPPER ENDOSCOPIC ULTRASOUND (EUS) RADIAL;  Surgeon: [name], MD;  Location: [facility];  Service: Endoscopy;  Laterality: N/A;   INCISION AND DRAINAGE     thrombosed hemorrhoid   LAPAROSCOPIC CHOLECYSTECTOMY  01/25/2013    Current Medications: Current Meds  Medication Sig   acetaminophen (TYLENOL) 500 MG tablet Take 1,500 mg by mouth every 6 (six) hours as needed for moderate pain.   amLODipine (NORVASC) 10 MG tablet Take 10 mg by mouth daily.   aspirin 81 MG tablet Take 81 mg by mouth daily.   Evolocumab (REPATHA SURECLICK) 295 MG/ML SOAJ INJECT 1 PEN INTO THE SKIN EVERY 14 DAYS   famotidine (PEPCID) 20 MG tablet 2 tablets   fluticasone (FLONASE) 50 MCG/ACT nasal spray SHAKE LIQUID AND USE 2 SPRAYS IN EACH NOSTRIL DAILY   hydrocortisone (CORTEF) 10 MG tablet Take 1 tablet (10 mg total) by mouth every morning AND 0.5 tablets (5 mg total) every evening.   levocetirizine (XYZAL) 5 MG tablet Take 5 mg by mouth daily.   methylPREDNISolone (MEDROL DOSEPAK) 4 MG TBPK tablet Take by mouth as directed.   metoprolol succinate (TOPROL-XL) 25 MG 24 hr tablet TAKE 1 TABLET(25 MG) BY MOUTH DAILY.  Multiple Vitamin (MULTIVITAMIN) tablet Take 1 tablet by mouth daily.   nitroGLYCERIN (NITROSTAT) 0.4 MG SL tablet Place 1 tablet (0.4 mg total) under the tongue every 5 (five) minutes as needed for chest pain.   pantoprazole (PROTONIX) 40 MG tablet TAKE 1 TABLET(40 MG) BY MOUTH DAILY   testosterone cypionate (DEPOTESTOSTERONE CYPIONATE) 200 MG/ML injection Inject 200 mg into the muscle every 14 (fourteen) days.   tiZANidine (ZANAFLEX) 2 MG tablet Take 1 tablet (2 mg total) by mouth at bedtime.   travoprost, benzalkonium, (TRAVATAN) 0.004 % ophthalmic solution Place 1 drop into both eyes at bedtime.    TURMERIC PO Take 2 capsules by mouth daily.   zolpidem (AMBIEN) 10 MG  tablet TAKE 1 TABLET(10 MG) BY MOUTH AT BEDTIME AS NEEDED FOR SLEEP   [DISCONTINUED] CETIRIZINE HCL PO Take by mouth.     Allergies:   Codeine   Social History   Socioeconomic History   Marital status: Married    Spouse name: Not on file   Number of children: Not on file   Years of education: Not on file   Highest education level: Not on file  Occupational History   Not on file  Tobacco Use   Smoking status: Never   Smokeless tobacco: Never  Vaping Use   Vaping Use: Never used  Substance and Sexual Activity   Alcohol use: Yes    Comment: Drinks a beer or glass of wine 1-2 times per month    Drug use: No   Sexual activity: Never  Other Topics Concern   Not on file  Social History Narrative   Right Handed   Lives in a one story home   Social Determinants of Health   Financial Resource Strain: Not on file  Food Insecurity: Not on file  Transportation Needs: Not on file  Physical Activity: Not on file  Stress: Not on file  Social Connections: Not on file     Family History: The patient's family history includes Breast cancer in his mother; Cancer in his mother; Heart attack in his father; Heart disease in his father; Heart failure in his mother.  ROS:   Please see the history of present illness.    Lower back is limiting the patient's ability to enjoy and have reasonable quality of life.  It is been recommended that he undergo back surgery.  At this point his valve should probably be fixed before any extensive rehab and prolonged back operation.  All other systems reviewed and are negative.  EKGs/Labs/Other Studies Reviewed:    The following studies were reviewed today:  2 D Doppler ECHO 03/25/2021:  IMPRESSIONS   1. Fusion of left and right coronary cusps; moderate to severe AS (mean  gradient 24 mmHg suggestive of moderate AS similar to 03/12/20); however  AVA 0.7 cm2 and DI 0.25 suggestive of severe AS.   2. Left ventricular ejection fraction, by estimation, is  60 to 65%. The  left ventricle has normal function. The left ventricle has no regional  wall motion abnormalities. Left ventricular diastolic parameters are  consistent with Grade I diastolic  dysfunction (impaired relaxation). Elevated left atrial pressure.   3. Right ventricular systolic function is normal. The right ventricular  size is normal.   4. The mitral valve is normal in structure. Mild mitral valve  regurgitation. No evidence of mitral stenosis.   5. The aortic valve is normal in structure. Aortic valve regurgitation is  mild. Moderate to severe aortic valve stenosis.   6.  The inferior vena cava is normal in size with greater than 50%  respiratory variability, suggesting right atrial pressure of 3 mmHg.   EKG:  EKG normal sinus rhythm, prominent voltage consistent with left ventricular hypertrophy.  Normal PR.  Possible atrial abnormality.  Recent Labs: No results found for requested labs within last 365 days.  Recent Lipid Panel    Component Value Date/Time   CHOL 143 02/20/2020 0909   TRIG 117 02/20/2020 0909   HDL 34 (L) 02/20/2020 0909   CHOLHDL 4.2 02/20/2020 0909   CHOLHDL 5.5 (H) 12/14/2014 0901   VLDL 17 12/14/2014 0901   LDLCALC 88 02/20/2020 0909    Physical Exam:    VS:  BP 112/68   Pulse 61   Ht '5\' 6"'$  (1.676 m)   Wt 170 lb 12.8 oz (77.5 kg)   SpO2 96%   BMI 27.57 kg/m     Wt Readings from Last 3 Encounters:  10/30/21 170 lb 12.8 oz (77.5 kg)  02/20/20 175 lb (79.4 kg)  02/16/20 174 lb (78.9 kg)     GEN: Appears younger than the stated age. No acute distress HEENT: Normal NECK: No JVD. LYMPHATICS: No lymphadenopathy CARDIAC: 3/6 to 4/6 crescendo decrescendo systolic murmur without diastolic murmur. RRR S4 gallop, or edema. VASCULAR:  Normal Pulses. No bruits. RESPIRATORY:  Clear to auscultation without rales, wheezing or rhonchi  ABDOMEN: Soft, non-tender, non-distended, No pulsatile mass, MUSCULOSKELETAL: No deformity  SKIN: Warm and  dry NEUROLOGIC:  Alert and oriented x 3 PSYCHIATRIC:  Normal affect   ASSESSMENT:    1. Aortic valve stenosis, etiology of cardiac valve disease unspecified   2. Coronary artery disease involving native coronary artery of native heart without angina pectoris   3. Essential hypertension   4. Peripheral vascular disease ([place])   5. Other hyperlipidemia   6. Statin myopathy    PLAN:    In order of problems listed above:  The patient is limited by severe lumbar spine disease and does not get much physical activity.  No overt symptoms of angina, heart failure, or episodes of syncope.  In looking at the echocardiogram from 6 months ago, and and considering the future need for extensive back surgery, I am recommending that we start evaluation for aortic valve replacement by TAVR sooner rather than later.  If he qualifies for valve replacement he would like to have it in January.\ Stable without angina.  Prior right coronary and circumflex stents 13 years ago. Blood pressure is excellent in the setting of aortic stenosis. Denies claudication but difficult to determine if what he feels in his legs when he walks is due to lumbar disc disease or not. He is on Repatha.   Overall education and awareness concerning secondary risk prevention was discussed in detail: LDL less than 70, hemoglobin A1c less than 7, blood pressure target less than 130/80 mmHg, >150 minutes of moderate aerobic activity per week, avoidance of smoking, weight control (via diet and exercise), and continued surveillance/management of/for obstructive sleep apnea.   Natural history of aortic valve stenosis was discussed in detail.  Cardinal symptoms of angina, syncope, and dyspnea were reviewed and significance relative to prognosis was described.  The importance of sequential imaging for disease monitoring was emphasized.  Work-up including possible heart catheterization and CT angiography were described as essential components of  staging for therapy.  Treatment options, TAVR and SAVR, were discussed in some detail with emphasis on TAVR.   The patient was counseled to undergo left  heart catheterization, coronary angiography, and possible percutaneous coronary intervention with stent implantation. The procedural risks and benefits were discussed in detail. The risks discussed included death, stroke, myocardial infarction, life-threatening bleeding, limb ischemia, kidney injury, allergy, and possible emergency cardiac surgery. The risk of these significant complications were estimated to occur less than 1% of the time. After discussion, the patient has agreed to proceed.   He has an affinity for [facility].  He is going to have to decide if he would pursue TAVR here or at [facility].  I do not see a reason for him to have this procedure done out of [facility] but final decision will be up to him.  Medication Adjustments/Labs and Tests Ordered: Current medicines are reviewed at length with the patient today.  Concerns regarding medicines are outlined above.  No orders of the defined types were placed in this encounter.  No orders of the defined types were placed in this encounter.   There are no Patient Instructions on file for this visit.   Signed, Sinclair Grooms, MD  10/30/2021 9:48 AM    [place]

## 2021-10-29 NOTE — Progress Notes (Unsigned)
Cardiology Office Note:    Date:  10/30/2021   ID:  Robert Estes, DOB [birth year], MRN [id number]  PCP:  Horald Pollen, MD  Cardiologist:  Sinclair Grooms, MD   Referring MD: Horald Pollen, *   Chief Complaint  Patient presents with   Coronary Artery Disease   Loss of Consciousness   Cardiac Valve Problem    Aortic stenosis    History of Present Illness:    Robert Estes is a 76 y.o. male with a hx of CAD with prior stent implantation and RCA and circumflex 2010, hypertension, probable bicuspid aortic valve with significant aortic stenosis by echo February 2023, and left carotid bruit. Also has history of hyperlipidemia.  He has no symptoms of syncope, dyspnea, or angina.  He does know that there is aortic stenosis present.  This past February there were parameters suggesting severe aortic stenosis (dimensionless index 0.25 and calculated aortic valve area 0.7 cm)) however peak velocity was 3.78 m/s on the same study.  He has coronary disease but has not had any angina since the right coronary and circumflex were stented in 2010.  Past Medical History:  Diagnosis Date   Arthritis    "hands, knees, elbows, fingers" (01/25/2013)   Coronary artery disease    HX OF HEART STENTS X 2   2010--DR. Linard Millers IS PT'S CARDIOLOGIST, MILD AORTIC STENOSIS   Dizziness    Enlarged prostate    FLOMAX HELPS PT VOID   Exertional shortness of breath    GERD (gastroesophageal reflux disease)    OTC PREVACID AS NEEDED   Glaucoma    BOTH EYES   Gout    Heart murmur    Hyperlipidemia    Hypertension    Hypertension    Iron deficiency anemia    hx   Migraine    "monthly" (01/25/2013)   Peripheral vascular disease ([place])    CLAUDICATION    Past Surgical History:  Procedure Laterality Date   CHOLECYSTECTOMY N/A 01/25/2013   Procedure: LAPAROSCOPIC CHOLECYSTECTOMY WITH INTRAOPERATIVE CHOLANGIOGRAM;  Surgeon: [name], MD;  Location: [facility];  Service: General;   Laterality: N/A;   CORONARY ANGIOPLASTY WITH STENT PLACEMENT  11/27/2008   "2" (01/25/2013)   EUS  12/24/2011   Procedure: FULL UPPER ENDOSCOPIC ULTRASOUND (EUS) RADIAL;  Surgeon: [name], MD;  Location: [facility];  Service: Endoscopy;  Laterality: N/A;   INCISION AND DRAINAGE     thrombosed hemorrhoid   LAPAROSCOPIC CHOLECYSTECTOMY  01/25/2013    Current Medications: Current Meds  Medication Sig   acetaminophen (TYLENOL) 500 MG tablet Take 1,500 mg by mouth every 6 (six) hours as needed for moderate pain.   amLODipine (NORVASC) 10 MG tablet Take 10 mg by mouth daily.   aspirin 81 MG tablet Take 81 mg by mouth daily.   Evolocumab (REPATHA SURECLICK) 185 MG/ML SOAJ INJECT 1 PEN INTO THE SKIN EVERY 14 DAYS   famotidine (PEPCID) 20 MG tablet 2 tablets   fluticasone (FLONASE) 50 MCG/ACT nasal spray SHAKE LIQUID AND USE 2 SPRAYS IN EACH NOSTRIL DAILY   hydrocortisone (CORTEF) 10 MG tablet Take 1 tablet (10 mg total) by mouth every morning AND 0.5 tablets (5 mg total) every evening.   levocetirizine (XYZAL) 5 MG tablet Take 5 mg by mouth daily.   methylPREDNISolone (MEDROL DOSEPAK) 4 MG TBPK tablet Take by mouth as directed.   metoprolol succinate (TOPROL-XL) 25 MG 24 hr tablet TAKE 1 TABLET(25 MG) BY MOUTH DAILY.  Multiple Vitamin (MULTIVITAMIN) tablet Take 1 tablet by mouth daily.   nitroGLYCERIN (NITROSTAT) 0.4 MG SL tablet Place 1 tablet (0.4 mg total) under the tongue every 5 (five) minutes as needed for chest pain.   pantoprazole (PROTONIX) 40 MG tablet TAKE 1 TABLET(40 MG) BY MOUTH DAILY   testosterone cypionate (DEPOTESTOSTERONE CYPIONATE) 200 MG/ML injection Inject 200 mg into the muscle every 14 (fourteen) days.   tiZANidine (ZANAFLEX) 2 MG tablet Take 1 tablet (2 mg total) by mouth at bedtime.   travoprost, benzalkonium, (TRAVATAN) 0.004 % ophthalmic solution Place 1 drop into both eyes at bedtime.    TURMERIC PO Take 2 capsules by mouth daily.   zolpidem (AMBIEN) 10 MG  tablet TAKE 1 TABLET(10 MG) BY MOUTH AT BEDTIME AS NEEDED FOR SLEEP   [DISCONTINUED] CETIRIZINE HCL PO Take by mouth.     Allergies:   Codeine   Social History   Socioeconomic History   Marital status: Married    Spouse name: Not on file   Number of children: Not on file   Years of education: Not on file   Highest education level: Not on file  Occupational History   Not on file  Tobacco Use   Smoking status: Never   Smokeless tobacco: Never  Vaping Use   Vaping Use: Never used  Substance and Sexual Activity   Alcohol use: Yes    Comment: Drinks a beer or glass of wine 1-2 times per month    Drug use: No   Sexual activity: Never  Other Topics Concern   Not on file  Social History Narrative   Right Handed   Lives in a one story home   Social Determinants of Health   Financial Resource Strain: Not on file  Food Insecurity: Not on file  Transportation Needs: Not on file  Physical Activity: Not on file  Stress: Not on file  Social Connections: Not on file     Family History: The patient's family history includes Breast cancer in his mother; Cancer in his mother; Heart attack in his father; Heart disease in his father; Heart failure in his mother.  ROS:   Please see the history of present illness.    Lower back is limiting the patient's ability to enjoy and have reasonable quality of life.  It is been recommended that he undergo back surgery.  At this point his valve should probably be fixed before any extensive rehab and prolonged back operation.  All other systems reviewed and are negative.  EKGs/Labs/Other Studies Reviewed:    The following studies were reviewed today:  2 D Doppler ECHO 03/25/2021:  IMPRESSIONS   1. Fusion of left and right coronary cusps; moderate to severe AS (mean  gradient 24 mmHg suggestive of moderate AS similar to 03/12/20); however  AVA 0.7 cm2 and DI 0.25 suggestive of severe AS.   2. Left ventricular ejection fraction, by estimation, is  60 to 65%. The  left ventricle has normal function. The left ventricle has no regional  wall motion abnormalities. Left ventricular diastolic parameters are  consistent with Grade I diastolic  dysfunction (impaired relaxation). Elevated left atrial pressure.   3. Right ventricular systolic function is normal. The right ventricular  size is normal.   4. The mitral valve is normal in structure. Mild mitral valve  regurgitation. No evidence of mitral stenosis.   5. The aortic valve is normal in structure. Aortic valve regurgitation is  mild. Moderate to severe aortic valve stenosis.   6.  The inferior vena cava is normal in size with greater than 50%  respiratory variability, suggesting right atrial pressure of 3 mmHg.   EKG:  EKG normal sinus rhythm, prominent voltage consistent with left ventricular hypertrophy.  Normal PR.  Possible atrial abnormality.  Recent Labs: No results found for requested labs within last 365 days.  Recent Lipid Panel    Component Value Date/Time   CHOL 143 02/20/2020 0909   TRIG 117 02/20/2020 0909   HDL 34 (L) 02/20/2020 0909   CHOLHDL 4.2 02/20/2020 0909   CHOLHDL 5.5 (H) 12/14/2014 0901   VLDL 17 12/14/2014 0901   LDLCALC 88 02/20/2020 0909    Physical Exam:    VS:  BP 112/68   Pulse 61   Ht '5\' 6"'$  (1.676 m)   Wt 170 lb 12.8 oz (77.5 kg)   SpO2 96%   BMI 27.57 kg/m     Wt Readings from Last 3 Encounters:  10/30/21 170 lb 12.8 oz (77.5 kg)  02/20/20 175 lb (79.4 kg)  02/16/20 174 lb (78.9 kg)     GEN: Appears younger than the stated age. No acute distress HEENT: Normal NECK: No JVD. LYMPHATICS: No lymphadenopathy CARDIAC: 3/6 to 4/6 crescendo decrescendo systolic murmur without diastolic murmur. RRR S4 gallop, or edema. VASCULAR:  Normal Pulses. No bruits. RESPIRATORY:  Clear to auscultation without rales, wheezing or rhonchi  ABDOMEN: Soft, non-tender, non-distended, No pulsatile mass, MUSCULOSKELETAL: No deformity  SKIN: Warm and  dry NEUROLOGIC:  Alert and oriented x 3 PSYCHIATRIC:  Normal affect   ASSESSMENT:    1. Aortic valve stenosis, etiology of cardiac valve disease unspecified   2. Coronary artery disease involving native coronary artery of native heart without angina pectoris   3. Essential hypertension   4. Peripheral vascular disease ([place])   5. Other hyperlipidemia   6. Statin myopathy    PLAN:    In order of problems listed above:  The patient is limited by severe lumbar spine disease and does not get much physical activity.  No overt symptoms of angina, heart failure, or episodes of syncope.  In looking at the echocardiogram from 6 months ago, and and considering the future need for extensive back surgery, I am recommending that we start evaluation for aortic valve replacement by TAVR sooner rather than later.  If he qualifies for valve replacement he would like to have it in January.\ Stable without angina.  Prior right coronary and circumflex stents 13 years ago. Blood pressure is excellent in the setting of aortic stenosis. Denies claudication but difficult to determine if what he feels in his legs when he walks is due to lumbar disc disease or not. He is on Repatha.   Overall education and awareness concerning secondary risk prevention was discussed in detail: LDL less than 70, hemoglobin A1c less than 7, blood pressure target less than 130/80 mmHg, >150 minutes of moderate aerobic activity per week, avoidance of smoking, weight control (via diet and exercise), and continued surveillance/management of/for obstructive sleep apnea.   Natural history of aortic valve stenosis was discussed in detail.  Cardinal symptoms of angina, syncope, and dyspnea were reviewed and significance relative to prognosis was described.  The importance of sequential imaging for disease monitoring was emphasized.  Work-up including possible heart catheterization and CT angiography were described as essential components of  staging for therapy.  Treatment options, TAVR and SAVR, were discussed in some detail with emphasis on TAVR.   The patient was counseled to undergo left  heart catheterization, coronary angiography, and possible percutaneous coronary intervention with stent implantation. The procedural risks and benefits were discussed in detail. The risks discussed included death, stroke, myocardial infarction, life-threatening bleeding, limb ischemia, kidney injury, allergy, and possible emergency cardiac surgery. The risk of these significant complications were estimated to occur less than 1% of the time. After discussion, the patient has agreed to proceed.   He has an affinity for [facility].  He is going to have to decide if he would pursue TAVR here or at [facility].  I do not see a reason for him to have this procedure done out of [facility] but final decision will be up to him.  Medication Adjustments/Labs and Tests Ordered: Current medicines are reviewed at length with the patient today.  Concerns regarding medicines are outlined above.  No orders of the defined types were placed in this encounter.  No orders of the defined types were placed in this encounter.   There are no Patient Instructions on file for this visit.   Signed, Sinclair Grooms, MD  10/30/2021 9:48 AM    [place]

## 2021-10-30 ENCOUNTER — Encounter: Payer: Self-pay | Admitting: Interventional Cardiology

## 2021-10-30 ENCOUNTER — Ambulatory Visit: Payer: Medicare Other | Attending: Interventional Cardiology | Admitting: Interventional Cardiology

## 2021-10-30 VITALS — BP 112/68 | HR 61 | Ht 66.0 in | Wt 170.8 lb

## 2021-10-30 DIAGNOSIS — I350 Nonrheumatic aortic (valve) stenosis: Secondary | ICD-10-CM | POA: Diagnosis not present

## 2021-10-30 DIAGNOSIS — G720 Drug-induced myopathy: Secondary | ICD-10-CM

## 2021-10-30 DIAGNOSIS — I739 Peripheral vascular disease, unspecified: Secondary | ICD-10-CM

## 2021-10-30 DIAGNOSIS — Z01812 Encounter for preprocedural laboratory examination: Secondary | ICD-10-CM

## 2021-10-30 DIAGNOSIS — I10 Essential (primary) hypertension: Secondary | ICD-10-CM | POA: Diagnosis not present

## 2021-10-30 DIAGNOSIS — I2510 Atherosclerotic heart disease of native coronary artery without angina pectoris: Secondary | ICD-10-CM

## 2021-10-30 DIAGNOSIS — T466X5D Adverse effect of antihyperlipidemic and antiarteriosclerotic drugs, subsequent encounter: Secondary | ICD-10-CM

## 2021-10-30 DIAGNOSIS — E7849 Other hyperlipidemia: Secondary | ICD-10-CM

## 2021-10-30 DIAGNOSIS — T466X5A Adverse effect of antihyperlipidemic and antiarteriosclerotic drugs, initial encounter: Secondary | ICD-10-CM

## 2021-10-30 LAB — BASIC METABOLIC PANEL
BUN/Creatinine Ratio: 9 — ABNORMAL LOW (ref 10–24)
BUN: 15 mg/dL (ref 8–27)
CO2: 29 mmol/L (ref 20–29)
Calcium: 9.6 mg/dL (ref 8.6–10.2)
Chloride: 104 mmol/L (ref 96–106)
Creatinine, Ser: 1.75 mg/dL — ABNORMAL HIGH (ref 0.76–1.27)
Glucose: 90 mg/dL (ref 70–99)
Potassium: 4.2 mmol/L (ref 3.5–5.2)
Sodium: 141 mmol/L (ref 134–144)
eGFR: 40 mL/min/{1.73_m2} — ABNORMAL LOW (ref >59)

## 2021-10-30 LAB — CBC
Hematocrit: 43.9 % (ref 37.5–51.0)
Hemoglobin: 14.6 g/dL (ref 13.0–17.7)
MCH: 28.5 pg (ref 26.6–33.0)
MCHC: 33.3 g/dL (ref 31.5–35.7)
MCV: 86 fL (ref 79–97)
Platelets: 183 10*3/uL (ref 150–450)
RBC: 5.13 x10E6/uL (ref 4.14–5.80)
RDW: 16.3 % — ABNORMAL HIGH (ref 11.6–15.4)
WBC: 3.7 10*3/uL (ref 3.4–10.8)

## 2021-10-30 NOTE — Patient Instructions (Signed)
Medication Instructions:  Your physician recommends that you continue on your current medications as directed. Please refer to the Current Medication list given to you today.  *If you need a refill on your cardiac medications before your next appointment, please call your pharmacy*  Lab Work: TODAY: CBC, BMET If you have labs (blood work) drawn today and your tests are completely normal, you will receive your results only by: [place] (if you have MyChart) OR A paper copy in the mail If you have any lab test that is abnormal or we need to change your treatment, we will call you to review the results.  Testing/Procedures: NONE  Follow-Up: At [facility], you and your health needs are our priority.  As part of our continuing mission to provide you with exceptional heart care, we have created designated Provider Care Teams.  These Care Teams include your primary Cardiologist (physician) and Advanced Practice Providers (APPs -  Physician Assistants and Nurse Practitioners) who all work together to provide you with the care you need, when you need it.  Your next appointment:   2-4 week(s) after heart catheterization procedure  The format for your next appointment:   In Person  Provider:   Sinclair Grooms, MD  or Robbie Lis, PA-C, Christen Bame, NP, or Richardson Dopp, PA-C        Other Instructions       Cardiac/Peripheral Catheterization   You are scheduled for a Cardiac Catheterization on Wednesday, October 18 with Dr. Daneen Schick.  1. Please arrive at the Main Entrance A at [facility]: [place], [place] [address] on October 18 at 6:30 AM (This time is two hours before your procedure to ensure your preparation). Free valet parking service is available. You will check in at ADMITTING. The support person will be asked to wait in the waiting room.  It is OK to have someone drop you off and come back when you are ready to be discharged.         Special note: Every effort is made to have your procedure done on time. Please understand that emergencies sometimes delay scheduled procedures.   . 2. Diet: Do not eat solid foods after midnight.  You may have clear liquids until 5 AM the day of the procedure.  3. Medication instructions in preparation for your procedure:   Contrast Allergy: No  On the morning of your procedure, take Aspirin 81 mg and any morning medicines NOT listed above.  You may use sips of water.  4. Plan to go home the same day, you will only stay overnight if medically necessary. 5. You MUST have a responsible adult to drive you home. 6. An adult MUST be with you the first 24 hours after you arrive home. 7. Bring a current list of your medications, and the last time and date medication taken. 8. Bring ID and current insurance cards. 9.Please wear clothes that are easy to get on and off and wear slip-on shoes.  Thank you for allowing [place] to care for you!   -- [facility]   Important Information About [place]

## 2021-11-01 ENCOUNTER — Telehealth: Payer: Self-pay | Admitting: Interventional Cardiology

## 2021-11-01 NOTE — Telephone Encounter (Signed)
Wife called back to report she received the message and to thank the RN for returning her call.

## 2021-11-01 NOTE — Telephone Encounter (Signed)
Pt would like to know if he is able to eat anything the morning of scheduled procedure on 10/18. Please advise

## 2021-11-01 NOTE — Telephone Encounter (Signed)
Patient was returning call. She ask if you could just leave on the vm if he can eat or not eat. Please advise

## 2021-11-01 NOTE — Telephone Encounter (Signed)
Left message for patient to call back  

## 2021-11-01 NOTE — Telephone Encounter (Signed)
Left message for patient's wife advising that he cannot have any solid food after midnight prior to his procedure and that he can have clear liquids up until 5 am. Advised to call back with any questions.

## 2021-11-04 ENCOUNTER — Ambulatory Visit
Admission: RE | Admit: 2021-11-04 | Discharge: 2021-11-04 | Disposition: A | Discharge status: Home / Self Care | Payer: Medicare Other | Source: Ambulatory Visit | Attending: Family Medicine | Admitting: Family Medicine

## 2021-11-04 ENCOUNTER — Other Ambulatory Visit: Payer: Self-pay | Admitting: Family Medicine

## 2021-11-04 DIAGNOSIS — M549 Dorsalgia, unspecified: Secondary | ICD-10-CM

## 2021-11-05 ENCOUNTER — Telehealth: Payer: Self-pay | Admitting: Physician Assistant

## 2021-11-05 NOTE — Telephone Encounter (Signed)
  HEART AND VASCULAR CENTER   MULTIDISCIPLINARY HEART VALVE TEAM    Seen by Dr. Tamala Julian recently and referred to [facility] Structural Heart Team for potential TAVR. He was set up for a [facility] 10/18. Pt reported possibly wanting to have TAVR at [facility]. I called the patient to try to arrange a consult with [place]. The wife wants to get through the heart cath and then will call us back if he wants to proceed with valve work up here at [facility]. He will also likely need an updated echo as last one done in 02/2021.  Angelena Form PA-C  [facility]

## 2021-11-11 ENCOUNTER — Telehealth: Payer: Self-pay | Admitting: *Deleted

## 2021-11-11 NOTE — Telephone Encounter (Addendum)
Cardiac Catheterization scheduled at [facility] for: Wednesday November 13, 2021 11:30 AM Arrival time and place: [place] Entrance A at: 6:30 AM-pre-procedure hydration  Nothing to eat after midnight prior to procedure, clear liquids until 5 AM day of procedure.   Medication instructions: -Usual morning medications can be taken with sips of water including aspirin 81 mg.  Confirmed patient has responsible adult to drive home post procedure and be with patient first 24 hours after arriving home.  Patient reports no new symptoms concerning for COVID-19 in the past 10 days.  Reviewed procedure instructions, pre-procedure hydration with patient's wife (DPR).

## 2021-11-12 ENCOUNTER — Telehealth: Payer: Self-pay | Admitting: Interventional Cardiology

## 2021-11-12 NOTE — Telephone Encounter (Signed)
FYI--Patient's wife states they plan on bringing in a list of patient's medications tomorrow, 10/18 prior to procedure. She also wanted to confirm the date the patient had stents placed in 2010. I advised the only date of admission I am able to see in patient's chart is 11/27/2008. Advised RN will will call if this was not the correct date.

## 2021-11-12 NOTE — Telephone Encounter (Signed)
Left message for patient informing him it would be fine to bring a list of his medications to his procedure tomorrow and that his last stents were placed in November 2010.  Provided office number for callback if any questions.

## 2021-11-13 ENCOUNTER — Encounter (HOSPITAL_COMMUNITY)
Admission: RE | Disposition: A | Discharge status: Home / Self Care | Payer: Self-pay | Source: Home / Self Care | Attending: Interventional Cardiology

## 2021-11-13 ENCOUNTER — Ambulatory Visit (HOSPITAL_COMMUNITY)
Admission: RE | Admit: 2021-11-13 | Discharge: 2021-11-13 | Disposition: A | Discharge status: Home / Self Care | Payer: Medicare Other | Attending: Interventional Cardiology | Admitting: Interventional Cardiology

## 2021-11-13 ENCOUNTER — Other Ambulatory Visit: Payer: Self-pay

## 2021-11-13 DIAGNOSIS — R0989 Other specified symptoms and signs involving the circulatory and respiratory systems: Secondary | ICD-10-CM | POA: Diagnosis present

## 2021-11-13 DIAGNOSIS — I739 Peripheral vascular disease, unspecified: Secondary | ICD-10-CM | POA: Diagnosis not present

## 2021-11-13 DIAGNOSIS — I350 Nonrheumatic aortic (valve) stenosis: Secondary | ICD-10-CM

## 2021-11-13 DIAGNOSIS — T82855A Stenosis of coronary artery stent, initial encounter: Secondary | ICD-10-CM | POA: Insufficient documentation

## 2021-11-13 DIAGNOSIS — I10 Essential (primary) hypertension: Secondary | ICD-10-CM | POA: Diagnosis not present

## 2021-11-13 DIAGNOSIS — Z955 Presence of coronary angioplasty implant and graft: Secondary | ICD-10-CM | POA: Insufficient documentation

## 2021-11-13 DIAGNOSIS — I272 Pulmonary hypertension, unspecified: Secondary | ICD-10-CM | POA: Diagnosis not present

## 2021-11-13 DIAGNOSIS — I2584 Coronary atherosclerosis due to calcified coronary lesion: Secondary | ICD-10-CM | POA: Insufficient documentation

## 2021-11-13 DIAGNOSIS — I2510 Atherosclerotic heart disease of native coronary artery without angina pectoris: Secondary | ICD-10-CM | POA: Diagnosis not present

## 2021-11-13 DIAGNOSIS — E7849 Other hyperlipidemia: Secondary | ICD-10-CM | POA: Insufficient documentation

## 2021-11-13 DIAGNOSIS — I352 Nonrheumatic aortic (valve) stenosis with insufficiency: Secondary | ICD-10-CM | POA: Insufficient documentation

## 2021-11-13 DIAGNOSIS — Y832 Surgical operation with anastomosis, bypass or graft as the cause of abnormal reaction of the patient, or of later complication, without mention of misadventure at the time of the procedure: Secondary | ICD-10-CM | POA: Insufficient documentation

## 2021-11-13 DIAGNOSIS — G729 Myopathy, unspecified: Secondary | ICD-10-CM | POA: Diagnosis not present

## 2021-11-13 DIAGNOSIS — Z79899 Other long term (current) drug therapy: Secondary | ICD-10-CM | POA: Diagnosis not present

## 2021-11-13 DIAGNOSIS — N1831 Chronic kidney disease, stage 3a: Secondary | ICD-10-CM | POA: Diagnosis present

## 2021-11-13 HISTORY — PX: RIGHT/LEFT HEART CATH AND CORONARY ANGIOGRAPHY: CATH118266

## 2021-11-13 LAB — POCT I-STAT EG7
Acid-base deficit: 1 mmol/L (ref 0.0–2.0)
Bicarbonate: 24.6 mmol/L (ref 20.0–28.0)
Calcium, Ion: 1.26 mmol/L (ref 1.15–1.40)
HCT: 44 % (ref 39.0–52.0)
Hemoglobin: 15 g/dL (ref 13.0–17.0)
O2 Saturation: 77 %
Potassium: 4.2 mmol/L (ref 3.5–5.1)
Sodium: 143 mmol/L (ref 135–145)
TCO2: 26 mmol/L (ref 22–32)
pCO2, Ven: 43 mmHg — ABNORMAL LOW (ref 44–60)
pH, Ven: 7.366 (ref 7.25–7.43)
pO2, Ven: 43 mmHg (ref 32–45)

## 2021-11-13 LAB — POCT I-STAT 7, (LYTES, BLD GAS, ICA,H+H)
Acid-base deficit: 3 mmol/L — ABNORMAL HIGH (ref 0.0–2.0)
Bicarbonate: 22.2 mmol/L (ref 20.0–28.0)
Calcium, Ion: 1.24 mmol/L (ref 1.15–1.40)
HCT: 42 % (ref 39.0–52.0)
Hemoglobin: 14.3 g/dL (ref 13.0–17.0)
O2 Saturation: 95 %
Potassium: 4.1 mmol/L (ref 3.5–5.1)
Sodium: 140 mmol/L (ref 135–145)
TCO2: 23 mmol/L (ref 22–32)
pCO2 arterial: 37.8 mmHg (ref 32–48)
pH, Arterial: 7.376 (ref 7.35–7.45)
pO2, Arterial: 76 mmHg — ABNORMAL LOW (ref 83–108)

## 2021-11-13 SURGERY — RIGHT/LEFT HEART CATH AND CORONARY ANGIOGRAPHY
Anesthesia: LOCAL

## 2021-11-13 MED ORDER — ASPIRIN 81 MG PO CHEW
81.0000 mg | CHEWABLE_TABLET | ORAL | Status: AC
  Administered 2021-11-13: 81 mg via ORAL
  Filled 2021-11-13: qty 1

## 2021-11-13 MED ORDER — LIDOCAINE HCL (PF) 1 % IJ SOLN
INTRAMUSCULAR | Status: AC
  Filled 2021-11-13: qty 30

## 2021-11-13 MED ORDER — SODIUM CHLORIDE 0.9 % WEIGHT BASED INFUSION
3.0000 mL/kg/h | INTRAVENOUS | Status: AC
  Administered 2021-11-13: 3 mL/kg/h via INTRAVENOUS

## 2021-11-13 MED ORDER — SODIUM CHLORIDE 0.9 % IV SOLN: 250.0000 mL | INTRAVENOUS | Status: DC | PRN

## 2021-11-13 MED ORDER — ONDANSETRON HCL 4 MG/2ML IJ SOLN: 4.0000 mg | Freq: Four times a day (QID) | INTRAMUSCULAR | Status: DC | PRN

## 2021-11-13 MED ORDER — MIDAZOLAM HCL 2 MG/2ML IJ SOLN
INTRAMUSCULAR | Status: DC | PRN
  Administered 2021-11-13 (×2): 1 mg via INTRAVENOUS

## 2021-11-13 MED ORDER — VERAPAMIL HCL 2.5 MG/ML IV SOLN
INTRAVENOUS | Status: DC | PRN
  Administered 2021-11-13: 10 mL via INTRA_ARTERIAL

## 2021-11-13 MED ORDER — FENTANYL CITRATE (PF) 100 MCG/2ML IJ SOLN
INTRAMUSCULAR | Status: DC | PRN
  Administered 2021-11-13 (×2): 25 ug via INTRAVENOUS

## 2021-11-13 MED ORDER — NITROGLYCERIN 1 MG/10 ML FOR IR/CATH LAB
INTRA_ARTERIAL | Status: AC
  Filled 2021-11-13: qty 10

## 2021-11-13 MED ORDER — IOHEXOL 350 MG/ML SOLN
INTRAVENOUS | Status: DC | PRN
  Administered 2021-11-13: 70 mL

## 2021-11-13 MED ORDER — FENTANYL CITRATE (PF) 100 MCG/2ML IJ SOLN
INTRAMUSCULAR | Status: AC
  Filled 2021-11-13: qty 2

## 2021-11-13 MED ORDER — SODIUM CHLORIDE 0.9% FLUSH: 3.0000 mL | Freq: Two times a day (BID) | INTRAVENOUS | Status: DC

## 2021-11-13 MED ORDER — MIDAZOLAM HCL 2 MG/2ML IJ SOLN
INTRAMUSCULAR | Status: AC
  Filled 2021-11-13: qty 2

## 2021-11-13 MED ORDER — HYDRALAZINE HCL 20 MG/ML IJ SOLN: 10.0000 mg | INTRAMUSCULAR | Status: DC | PRN

## 2021-11-13 MED ORDER — HEPARIN SODIUM (PORCINE) 1000 UNIT/ML IJ SOLN
INTRAMUSCULAR | Status: DC | PRN
  Administered 2021-11-13: 4000 [IU] via INTRAVENOUS

## 2021-11-13 MED ORDER — VERAPAMIL HCL 2.5 MG/ML IV SOLN
INTRAVENOUS | Status: AC
  Filled 2021-11-13: qty 2

## 2021-11-13 MED ORDER — HEPARIN (PORCINE) IN NACL 1000-0.9 UT/500ML-% IV SOLN
INTRAVENOUS | Status: DC | PRN
  Administered 2021-11-13 (×2): 500 mL

## 2021-11-13 MED ORDER — LABETALOL HCL 5 MG/ML IV SOLN: 10.0000 mg | INTRAVENOUS | Status: DC | PRN

## 2021-11-13 MED ORDER — OXYCODONE HCL 5 MG PO TABS: 5.0000 mg | ORAL_TABLET | ORAL | Status: DC | PRN

## 2021-11-13 MED ORDER — HEPARIN SODIUM (PORCINE) 1000 UNIT/ML IJ SOLN
INTRAMUSCULAR | Status: AC
  Filled 2021-11-13: qty 10

## 2021-11-13 MED ORDER — SODIUM CHLORIDE 0.9% FLUSH: 3.0000 mL | INTRAVENOUS | Status: DC | PRN

## 2021-11-13 MED ORDER — SODIUM CHLORIDE 0.9 % IV SOLN: INTRAVENOUS | Status: DC

## 2021-11-13 MED ORDER — LIDOCAINE HCL (PF) 1 % IJ SOLN
INTRAMUSCULAR | Status: DC | PRN
  Administered 2021-11-13 (×2): 2 mL

## 2021-11-13 MED ORDER — SODIUM CHLORIDE 0.9 % WEIGHT BASED INFUSION
1.0000 mL/kg/h | INTRAVENOUS | Status: DC
  Administered 2021-11-13: 1 mL/kg/h via INTRAVENOUS

## 2021-11-13 MED ORDER — ACETAMINOPHEN 325 MG PO TABS: 650.0000 mg | ORAL_TABLET | ORAL | Status: DC | PRN

## 2021-11-13 SURGICAL SUPPLY — 10 items
CATH 5FR JL3.5 JR4 ANG PIG MP (CATHETERS) IMPLANT
CATH BALLN WEDGE 5F 110CM (CATHETERS) IMPLANT
GLIDESHEATH SLEND A-KIT 6F 22G (SHEATH) IMPLANT
GUIDEWIRE INQWIRE 1.5J.035X260 (WIRE) IMPLANT
INQWIRE 1.5J .035X260CM (WIRE) ×1
KIT HEART LEFT (KITS) ×2 IMPLANT
PACK CARDIAC CATHETERIZATION (CUSTOM PROCEDURE TRAY) ×2 IMPLANT
SHEATH GLIDE SLENDER 4/5FR (SHEATH) IMPLANT
TRANSDUCER W/STOPCOCK (MISCELLANEOUS) ×2 IMPLANT
TUBING CIL FLEX 10 FLL-RA (TUBING) ×2 IMPLANT

## 2021-11-13 NOTE — CV Procedure (Signed)
Patent proximal to mid RCA stent with with diffuse in-stent restenosis 50%.  Diffuse disease in the mid to distal vessel up to 95%.  80% stenosis in the distal third of the PDA.  Relatively small caliber vessel however does supply the inferior interventricular groove.   Left coronary system is codominant.  Left main is widely patent. LAD contains segmental 50 to 60% narrowing.  First diagonal contains proximal to mid eccentric 70 to 80% stenosis. Circumflex is codominant.  The mid circumflex stent contains mild to moderate proximal in-stent restenosis up to 30%. Ramus intermedius is small and contains ostial to proximal 90% narrowing Second obtuse marginal contains proximal to mid 50% narrowing Valve was not crossed. Pulmonary pressures are normal.  Referral to structural heart clinic for consideration of TAVR based upon asymptomatic critical aortic stenosis.  Patient is not having angina, so at this point have elected to continue conservative medical management of CAD.  [facility] are approachable with PCI if the need arises.  He has not decided whether to be considered for TAVR at [facility] or at [facility].

## 2021-11-13 NOTE — Interval H&P Note (Signed)
Cath Lab Visit (complete for each Cath Lab visit)  Clinical Evaluation Leading to the Procedure:   ACS: No.  Non-ACS:    Anginal Classification: CCS II  Anti-ischemic medical therapy: Minimal Therapy (1 class of medications)  Non-Invasive Test Results: No non-invasive testing performed  Prior CABG: No previous CABG      History and Physical Interval Note:  11/13/2021 12:16 PM  Robert Estes  has presented today for surgery, with the diagnosis of aortic stenosis.  The various methods of treatment have been discussed with the patient and family. After consideration of risks, benefits and other options for treatment, the patient has consented to  Procedure(s): RIGHT/LEFT HEART CATH AND CORONARY ANGIOGRAPHY (N/A) as a surgical intervention.  The patient's history has been reviewed, patient examined, no change in status, stable for surgery.  I have reviewed the patient's chart and labs.  Questions were answered to the patient's satisfaction.     Belva Crome III

## 2021-11-13 NOTE — Discharge Instructions (Signed)

## 2021-11-13 NOTE — Progress Notes (Signed)
Patient and son was given discharge instructions. Both verbalized understanding. 

## 2021-11-13 NOTE — Progress Notes (Signed)
TR BAND REMOVAL  LOCATION:    right radial  DEFLATED PER PROTOCOL:    Yes.    TIME BAND OFF / DRESSING APPLIED:    1615   SITE UPON ARRIVAL:    Level 0  SITE AFTER BAND REMOVAL:    Level 0  CIRCULATION SENSATION AND MOVEMENT:    Within Normal Limits   Yes.    COMMENTS:   No bleeding noted

## 2021-11-14 ENCOUNTER — Encounter (HOSPITAL_COMMUNITY): Payer: Self-pay | Admitting: Interventional Cardiology

## 2021-11-14 LAB — POCT I-STAT EG7
Acid-base deficit: 2 mmol/L (ref 0.0–2.0)
Bicarbonate: 22.9 mmol/L (ref 20.0–28.0)
Calcium, Ion: 1.27 mmol/L (ref 1.15–1.40)
HCT: 44 % (ref 39.0–52.0)
Hemoglobin: 15 g/dL (ref 13.0–17.0)
O2 Saturation: 79 %
Potassium: 4.2 mmol/L (ref 3.5–5.1)
Sodium: 142 mmol/L (ref 135–145)
TCO2: 24 mmol/L (ref 22–32)
pCO2, Ven: 40.9 mmHg — ABNORMAL LOW (ref 44–60)
pH, Ven: 7.357 (ref 7.25–7.43)
pO2, Ven: 45 mmHg (ref 32–45)

## 2021-11-14 NOTE — Telephone Encounter (Signed)
Wife wanted to thank kristie for the call back and she thank everyone for helping her husband. Please advise

## 2021-11-21 ENCOUNTER — Telehealth: Payer: Self-pay | Admitting: Physician Assistant

## 2021-11-21 NOTE — Telephone Encounter (Signed)
  HEART AND VASCULAR CENTER   MULTIDISCIPLINARY HEART VALVE TEAM   Reached out to pt about making structural heart apt. Still undecided. Wants to know what cost might be. I am not sure where he could get that information. He will call us back Monday to let us know if he wants to go here or [facility].   Angelena Form PA-C  [facility]

## 2021-11-22 ENCOUNTER — Encounter: Payer: Self-pay | Admitting: Physician Assistant

## 2021-11-29 ENCOUNTER — Other Ambulatory Visit: Payer: Self-pay

## 2021-11-29 MED ORDER — NITROGLYCERIN 0.4 MG SL SUBL: 0.4000 mg | SUBLINGUAL_TABLET | SUBLINGUAL | 11 refills | Status: AC | PRN

## 2021-11-29 NOTE — Telephone Encounter (Signed)
Pt's medication was sent to pt's pharmacy as requested. Confirmation received.  °

## 2021-12-12 ENCOUNTER — Telehealth: Payer: Self-pay | Admitting: Physician Assistant

## 2021-12-12 DIAGNOSIS — I350 Nonrheumatic aortic (valve) stenosis: Secondary | ICD-10-CM

## 2021-12-12 NOTE — Telephone Encounter (Signed)
  HEART AND VASCULAR CENTER   MULTIDISCIPLINARY HEART VALVE TEAM   Called patient and spoke to son who is his POA. They have decided they want to pursue structural consult at [facility]. I will take him off our list and have Dr Amalio Loe Caul nurse send a formal referral to [facility] for TAVR consult.   Angelena Form PA-C  [facility]

## 2021-12-16 ENCOUNTER — Other Ambulatory Visit: Payer: Self-pay | Admitting: Interventional Cardiology

## 2021-12-18 NOTE — Addendum Note (Signed)
Addended by: Molli Barrows on: 12/18/2021 02:50 PM   Modules accepted: Orders

## 2021-12-18 NOTE — Telephone Encounter (Signed)
Referral to [facility] Cardiology Structural Team placed.  Please send referral information/medical records to fax number [phone].

## 2021-12-24 ENCOUNTER — Other Ambulatory Visit: Payer: Self-pay | Admitting: Physician Assistant

## 2021-12-24 DIAGNOSIS — I350 Nonrheumatic aortic (valve) stenosis: Secondary | ICD-10-CM

## 2021-12-24 NOTE — Progress Notes (Unsigned)
External referral to [facility] Cardiology, Dr. Cheree Ditto for TAVR consult.

## 2021-12-25 ENCOUNTER — Telehealth: Payer: Self-pay | Admitting: Interventional Cardiology

## 2021-12-25 ENCOUNTER — Encounter: Payer: Self-pay | Admitting: Adult Health

## 2021-12-25 NOTE — Telephone Encounter (Signed)
Tonya from [facility] is calling in regards to a referral they received for this patient. They states provider Dr. Anastasio Champion no longer works for them and they would need a new referral sent for another provider.

## 2021-12-25 NOTE — Telephone Encounter (Signed)
Returned call and spoke with [place].  She states they received our referral but their office is for general cardiology and that another referral will need to be sent to Dr. Durward Mallard at another office, she provided his office number: [phone]. She states it will be best to call their office to make the referral.  Staff message sent to schedulers with this information to assist in making referral to Dr. Aundra Millet office.

## 2021-12-25 NOTE — Telephone Encounter (Signed)
Error

## 2022-01-09 ENCOUNTER — Other Ambulatory Visit: Payer: Self-pay | Admitting: Physician Assistant

## 2022-01-09 DIAGNOSIS — I350 Nonrheumatic aortic (valve) stenosis: Secondary | ICD-10-CM

## 2022-01-09 NOTE — Addendum Note (Signed)
Addended by: Molli Barrows on: 01/09/2022 01:16 PM   Modules accepted: Orders

## 2022-02-05 ENCOUNTER — Other Ambulatory Visit (HOSPITAL_COMMUNITY): Payer: Self-pay

## 2022-04-24 ENCOUNTER — Telehealth (HOSPITAL_COMMUNITY): Payer: Self-pay

## 2022-04-24 NOTE — Telephone Encounter (Signed)
Pt insurance is active and benefits verified through [facility] Medicare. Co-pay $0.00, DED $0.00/$0.00 met, out of pocket $3,600.00/$232.18 met, co-insurance 0%. No pre-authorization required. Passport, 04/24/22 @ 10:27AM, REF#[id number]   How many CR sessions are covered? (36 sessions for TCR, 72 sessions for ICR)72 Is this a lifetime maximum or an annual maximum? Annual Has the member used any of these services to date? No Is there a time limit (weeks/months) on start of program and/or program completion? No     Will contact patient to see if he is interested in the Cardiac Rehab Program. If interested, patient will need to complete follow up appt. Once completed, patient will be contacted for scheduling upon review by the RN Navigator.

## 2022-04-24 NOTE — Telephone Encounter (Signed)
Called patient to see if he is interested in the Cardiac Rehab Program. Patient expressed interest. Explained scheduling process and went over insurance, patient verbalized understanding.    Will pass to RN for review.

## 2022-05-09 ENCOUNTER — Emergency Department (HOSPITAL_COMMUNITY): Payer: Medicare Other

## 2022-05-09 ENCOUNTER — Emergency Department (HOSPITAL_COMMUNITY)
Admission: EM | Admit: 2022-05-09 | Discharge: 2022-05-10 | Payer: Medicare Other | Attending: Emergency Medicine | Admitting: Emergency Medicine

## 2022-05-09 ENCOUNTER — Encounter (HOSPITAL_COMMUNITY): Payer: Self-pay

## 2022-05-09 ENCOUNTER — Other Ambulatory Visit: Payer: Self-pay

## 2022-05-09 DIAGNOSIS — I214 Non-ST elevation (NSTEMI) myocardial infarction: Secondary | ICD-10-CM | POA: Diagnosis not present

## 2022-05-09 DIAGNOSIS — M79602 Pain in left arm: Secondary | ICD-10-CM | POA: Insufficient documentation

## 2022-05-09 DIAGNOSIS — Z79899 Other long term (current) drug therapy: Secondary | ICD-10-CM | POA: Diagnosis not present

## 2022-05-09 DIAGNOSIS — R0789 Other chest pain: Secondary | ICD-10-CM | POA: Diagnosis present

## 2022-05-09 DIAGNOSIS — Z7982 Long term (current) use of aspirin: Secondary | ICD-10-CM | POA: Insufficient documentation

## 2022-05-09 DIAGNOSIS — R4182 Altered mental status, unspecified: Secondary | ICD-10-CM | POA: Insufficient documentation

## 2022-05-09 LAB — CBC
HCT: 49.7 % (ref 39.0–52.0)
Hemoglobin: 15.5 g/dL (ref 13.0–17.0)
MCH: 25 pg — ABNORMAL LOW (ref 26.0–34.0)
MCHC: 31.2 g/dL (ref 30.0–36.0)
MCV: 80 fL (ref 80.0–100.0)
Platelets: 187 10*3/uL (ref 150–400)
RBC: 6.21 MIL/uL — ABNORMAL HIGH (ref 4.22–5.81)
RDW: 19 % — ABNORMAL HIGH (ref 11.5–15.5)
WBC: 4.7 10*3/uL (ref 4.0–10.5)
nRBC: 0 % (ref 0.0–0.2)

## 2022-05-09 LAB — BASIC METABOLIC PANEL
Anion gap: 8 (ref 5–15)
BUN: 20 mg/dL (ref 8–23)
CO2: 23 mmol/L (ref 22–32)
Calcium: 9.2 mg/dL (ref 8.9–10.3)
Chloride: 107 mmol/L (ref 98–111)
Creatinine, Ser: 1.66 mg/dL — ABNORMAL HIGH (ref 0.61–1.24)
GFR, Estimated: 43 mL/min — ABNORMAL LOW (ref >60)
Glucose, Bld: 95 mg/dL (ref 70–99)
Potassium: 3.9 mmol/L (ref 3.5–5.1)
Sodium: 138 mmol/L (ref 135–145)

## 2022-05-09 LAB — TROPONIN I (HIGH SENSITIVITY)
Troponin I (High Sensitivity): 579 ng/L (ref <18)
Troponin I (High Sensitivity): 1389 ng/L (ref <18)
Troponin I (High Sensitivity): 3060 ng/L (ref <18)

## 2022-05-09 MED ORDER — NITROGLYCERIN 0.4 MG SL SUBL
0.4000 mg | SUBLINGUAL_TABLET | Freq: Once | SUBLINGUAL | Status: AC
  Administered 2022-05-09: 0.4 mg via SUBLINGUAL
  Filled 2022-05-09: qty 1

## 2022-05-09 MED ORDER — HEPARIN BOLUS VIA INFUSION
4000.0000 [IU] | INTRAVENOUS | Status: AC
  Administered 2022-05-09: 4000 [IU] via INTRAVENOUS
  Filled 2022-05-09: qty 4000

## 2022-05-09 MED ORDER — SODIUM CHLORIDE 0.9 % IV BOLUS
500.0000 mL | Freq: Once | INTRAVENOUS | Status: AC
  Administered 2022-05-09: 500 mL via INTRAVENOUS

## 2022-05-09 MED ORDER — HEPARIN (PORCINE) 25000 UT/250ML-% IV SOLN
900.0000 [IU]/h | INTRAVENOUS | Status: DC
  Administered 2022-05-09: 900 [IU]/h via INTRAVENOUS
  Filled 2022-05-09: qty 250

## 2022-05-09 MED ORDER — IOHEXOL 350 MG/ML SOLN
100.0000 mL | Freq: Once | INTRAVENOUS | Status: AC | PRN
  Administered 2022-05-09: 100 mL via INTRAVENOUS

## 2022-05-09 MED ORDER — NITROGLYCERIN 2 % TD OINT
0.5000 [in_us] | TOPICAL_OINTMENT | Freq: Once | TRANSDERMAL | Status: AC
  Administered 2022-05-09: 0.5 [in_us] via TOPICAL
  Filled 2022-05-09: qty 1

## 2022-05-09 NOTE — ED Provider Notes (Addendum)
[place] EMERGENCY DEPARTMENT AT [facility] Provider Note   CSN: [id number] Arrival date & time: 05/09/22  1628     History  Chief Complaint  Patient presents with   Chest Pain    Robert Estes is a 77 y.o. male.  HPI   77 year old male with past medical history of aortic stenosis status post TAVR on 3/30 at [facility] presents emergency department with left arm/left-sided chest pain.  Patient states earlier this morning around 10 AM he was lifting a car tire trying to change it.  He had onset of left bicep pain that started to radiate to the left side of his chest.  Patient took a Tylenol and took a nap but woke up with the pain and decided to come in for evaluation.  Denies any midsternal chest pain or heaviness, no back pain or shortness of breath.  Denies any other acute symptoms like fever, swelling of his lower extremities. Currently follows with [facility] for cardiology.  Home Medications Prior to Admission medications   Medication Sig Start Date End Date Taking? Authorizing Provider  acetaminophen (TYLENOL) 500 MG tablet Take 1,500 mg by mouth every 6 (six) hours as needed for moderate pain.    [provider]  amLODipine (NORVASC) 10 MG tablet Take 10 mg by mouth daily. 10/25/21   [provider]  aspirin 81 MG tablet Take 81 mg by mouth daily.    [provider]  Evolocumab (REPATHA SURECLICK) 140 MG/ML SOAJ INJECT 1 PEN INTO THE SKIN EVERY 14 DAYS 10/01/21   Lyn Records, MD  famotidine (PEPCID) 20 MG tablet 2 tablets    [provider]  fluticasone (FLONASE) 50 MCG/ACT nasal spray SHAKE LIQUID AND USE 2 SPRAYS IN [facility] NOSTRIL DAILY 09/26/19   Lezlie Lye, Meda Coffee, MD  hydrocortisone (CORTEF) 10 MG tablet Take 1 tablet (10 mg total) by mouth every morning AND 0.5 tablets (5 mg total) every evening. 04/05/18   Lezlie Lye, Meda Coffee, MD  levocetirizine (XYZAL) 5 MG tablet Take 5 mg by mouth daily. 09/12/21   [provider]   methylPREDNISolone (MEDROL DOSEPAK) 4 MG TBPK tablet Take by mouth as directed. 10/20/20   [provider]  metoprolol succinate (TOPROL-XL) 25 MG 24 hr tablet TAKE 1 TABLET(25 MG) BY MOUTH DAILY. 12/17/21   Lyn Records, MD  Multiple Vitamin (MULTIVITAMIN) tablet Take 1 tablet by mouth daily.    [provider]  nitroGLYCERIN (NITROSTAT) 0.4 MG SL tablet Place 1 tablet (0.4 mg total) under the tongue every 5 (five) minutes as needed for chest pain. 11/29/21   Lyn Records, MD  pantoprazole (PROTONIX) 40 MG tablet TAKE 1 TABLET(40 MG) BY MOUTH DAILY 06/30/20   Georgina Quint, MD  testosterone cypionate (DEPOTESTOSTERONE CYPIONATE) 200 MG/ML injection Inject 200 mg into the muscle every 14 (fourteen) days. 09/12/21   [provider]  tiZANidine (ZANAFLEX) 2 MG tablet Take 1 tablet (2 mg total) by mouth at bedtime. 01/16/20   Patel, Roxana Hires K, DO  travoprost, benzalkonium, (TRAVATAN) 0.004 % ophthalmic solution Place 1 drop into both eyes at bedtime.     [provider]  TURMERIC PO Take 2 capsules by mouth daily.    [provider]  zolpidem (AMBIEN) 10 MG tablet TAKE 1 TABLET(10 MG) BY MOUTH AT BEDTIME AS NEEDED FOR SLEEP 07/21/20   Georgina Quint, MD      Allergies    Codeine    Review of Systems  Review of Systems  Constitutional:  Negative for fever.  Respiratory:  Negative for chest tightness and shortness of breath.   Cardiovascular:  Positive for chest pain. Negative for palpitations and leg swelling.  Gastrointestinal:  Negative for abdominal pain, diarrhea and vomiting.  Musculoskeletal:        + left arm pain  Skin:  Negative for rash.  Neurological:  Negative for dizziness, weakness, numbness and headaches.    Physical Exam Updated Vital Signs BP (!) 167/92 (BP Location: Right Arm)   Pulse 74   Temp 97.7 F (36.5 C) (Oral)   Resp 18   Ht  (1.676 m)   Wt 74.8 kg   SpO2 100%   BMI 26.63 kg/m  Physical  Exam Vitals and nursing note reviewed.  Constitutional:      General: He is not in acute distress.    Appearance: Normal appearance.  HENT:     Head: Normocephalic.     Mouth/Throat:     Mouth: Mucous membranes are moist.  Cardiovascular:     Rate and Rhythm: Normal rate.     Heart sounds: Heart sounds not distant.  Pulmonary:     Effort: Pulmonary effort is normal. No tachypnea or respiratory distress.  Abdominal:     Palpations: Abdomen is soft.     Tenderness: There is no abdominal tenderness.  Musculoskeletal:     Comments: LUE TTP along bicep, left lateral chest pain slightly reproducible, healed scars but no other skin changes  Skin:    General: Skin is warm.  Neurological:     Mental Status: He is alert and oriented to person, place, and time. Mental status is at baseline.  Psychiatric:        Mood and Affect: Mood normal.     ED Results / Procedures / Treatments   Labs (all labs ordered are listed, but only abnormal results are displayed) Labs Reviewed  CBC - Abnormal; Notable for the following components:      Result Value   RBC 6.21 (*)    MCH 25.0 (*)    RDW 19.0 (*)    All other components within normal limits  BASIC METABOLIC PANEL  TROPONIN I (HIGH SENSITIVITY)    EKG EKG Interpretation  Date/Time:  Friday May 09 2022 16:38:34 EDT Ventricular Rate:  72 PR Interval:  182 QRS Duration: 85 QT Interval:  367 QTC Calculation: 402 R Axis:   56 Text Interpretation: Sinus rhythm ST elevation suggests acute pericarditis Confirmed by Coralee Pesa [phone]) on 05/09/2022 5:20:00 PM  Radiology DG Chest 2 View  Result Date: 05/09/2022 CLINICAL DATA:  chest pain EXAM: CHEST - 2 VIEW COMPARISON:  01/31/2018. FINDINGS: The heart size and mediastinal contours are within normal limits. Both lungs are clear. No pneumothorax or pleural effusion. There are thoracic degenerative changes. There is a prosthetic aortic valve. IMPRESSION: No active cardiopulmonary  disease. Electronically Signed   By: Layla Maw M.D.   On: 05/09/2022 17:17    Procedures .Critical Care  Performed by: Rozelle Logan, DO Authorized by: Rozelle Logan, DO   Critical care provider statement:    Critical care time (minutes):  30   Critical care time was exclusive of:  Separately billable procedures and treating other patients   Critical care was necessary to treat or prevent imminent or life-threatening deterioration of the following conditions:  Cardiac failure   Critical care was time spent personally by me on the following activities:  Development of treatment plan  with patient or surrogate, discussions with consultants, evaluation of patient's response to treatment, examination of patient, ordering and review of laboratory studies, ordering and review of radiographic studies, ordering and performing treatments and interventions, pulse oximetry, re-evaluation of patient's condition and review of old charts   I assumed direction of critical care for this patient from another provider in my specialty: no     Care discussed with: admitting provider       Medications Ordered in ED Medications - No data to display  ED Course/ Medical Decision Making/ A&P                             Medical Decision Making Amount and/or Complexity of Data Reviewed Labs: ordered. Radiology: ordered.  Risk Prescription drug management.   77 year old male with past medical history of recent TAVR at [facility] who follows with [facility] cardiology presents emergency department left-sided chest pain.  This started while he was lifting a tire for a car earlier today, has been ongoing.  Currently present on arrival.  Vitals are stable.  EKG is NSR, shows STE marked as pericarditis. Going back to his care everywhere his previous EKGs are mentioned to have the same changes. No acute ischemic changes today.   Basic blood work is reassuring, however initial troponin is elevated over 500.  CTA  of the chest shows no dissection/complication with the TAVR.  Repeat troponin is uptrending, >1300.  Chest pain improved with sublingual nitroglycerin, Nitropaste placed. CP resolved.  Consulted with [facility] cardiology, Dr. Leonard Schwartz. Upadhya.  Reviewed patient's presentation and results, recommended for heparin and transfer to [facility] for admission.  Patient agrees with this transfer, wife at bedside and she agrees.  On reevaluation patient continues to be chest pain-free, heparin has been ordered, transfer is being initiated.  Patient stable at time of transfer.  Delay in patient transfer. Cardiology Dr. Lyndel Safe recontacted. Continues to accept but having difficulty with transfer center contact. We personally contacted [facility] transfer center and gave patient information. Will plan for follow up with transfer center and cardiology together and ensure transfer. Heparin is running, patient remains CP free and HDS.  Continued delay in transfer, issue with cardiology contacting transfer line. We have initiated transfer on our end, transfer center aware of patient and coordinating.   Final Clinical Impression(s) / ED Diagnoses Final diagnoses:  None    Rx / DC Orders ED Discharge Orders     None         Keyondra Lagrand, Clabe Seal, DO 05/09/22 2111    Rozelle Logan, DO 05/09/22 2344    Freddrick Gladson, Clabe Seal, DO 05/10/22 0006    Danelle Curiale, Clabe Seal, DO 05/10/22 1412

## 2022-05-09 NOTE — ED Notes (Signed)
[facility] paged again

## 2022-05-09 NOTE — Progress Notes (Signed)
ANTICOAGULATION CONSULT NOTE - Initial Consult  Pharmacy Consult for Heparin Indication: chest pain/ACS  Allergies  Allergen Reactions   Codeine Itching    Patient Measurements: Height: 5\' 6"  (167.6 cm) Weight: 74.8 kg (165 lb) IBW/kg (Calculated) : 63.8 Heparin Dosing Weight: TBW  Vital Signs: Temp: 97.7 F (36.5 C) (04/12 2100) Temp Source: Oral (04/12 1638) BP: 165/87 (04/12 2100) Pulse Rate: 81 (04/12 2100)  Labs: Recent Labs    05/09/22 1701 05/09/22 1840  HGB 15.5  --   HCT 49.7  --   PLT 187  --   CREATININE 1.66*  --   TROPONINIHS 579* 1,389*    Estimated Creatinine Clearance: 35.2 mL/min (A) (by C-G formula based on SCr of 1.66 mg/dL (H)).   Medical History: Past Medical History:  Diagnosis Date   Arthritis    "hands, knees, elbows, fingers" (01/25/2013)   Coronary artery disease    HX OF HEART STENTS X 2   2010--DR. Mendel Ryder IS PT'S CARDIOLOGIST, MILD AORTIC STENOSIS   Dizziness    Enlarged prostate    FLOMAX HELPS PT VOID   Exertional shortness of breath    GERD (gastroesophageal reflux disease)    OTC PREVACID AS NEEDED   Glaucoma    BOTH EYES   Gout    Hyperlipidemia    Hypertension    Hypertension    Iron deficiency anemia    hx   Migraine    "monthly" (01/25/2013)   Peripheral vascular disease    CLAUDICATION    Medications:  (Not in a hospital admission)  Scheduled:   heparin  4,000 Units Intravenous STAT   Infusions:   heparin      Assessment: 76 yoM presents to [facility] ED with chest pain and PMH significant for recent TAVR at [facility].  RN confirms with patient that he is on Aspirin, but no other blood thinners.  Pharmacy is consulted to dose heparin.  SCr 1.66 Troponin 579 > 1389 CBC: Hgb 15.5, Plt 187k Baseline coags in process.     Goal of Therapy:  Heparin level 0.3-0.7 units/ml Monitor platelets by anticoagulation protocol: Yes   Plan:  Give heparin 4000 units bolus IV x 1 Start heparin IV infusion at 900  units/hr Heparin level in 8 hours after starting Daily heparin level and CBC   Lynann Beaver PharmD, BCPS [facility] main pharmacy [phone] 05/09/2022 9:09 PM

## 2022-05-09 NOTE — ED Triage Notes (Signed)
Left-sided chest pain that started at 1000, pain does not radiate. Pt had aortic valve replacement 3/20 and was not supposed to go back to work. 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  Denies nausea, sob, dizziness, lightheadedness.

## 2022-05-09 NOTE — ED Provider Triage Note (Signed)
Emergency Medicine Provider Triage Evaluation Note  Robert Estes [facility] , a 77 y.o. male  was evaluated in triage.  Pt complains of sided chest pain.  History of TAVR at [facility] 3 weeks ago.  Was changing a tire earlier today, developed CP.  Pain nonexertional.  Initially had pain to left arm which has resolved.  Has some chronic back pain.  No shortness of breath, numbness or weakness..  Review of Systems  Positive: CP Negative: Numbness, weakness  Physical Exam  BP (!) 167/92 (BP Location: Right Arm)   Pulse 74   Temp 97.7 F (36.5 C) (Oral)   Resp 18   Ht 5\' 6"  (1.676 m)   Wt 74.8 kg   SpO2 100%   BMI 26.63 kg/m  Gen:   Awake, no distress   Resp:  Normal effort  Chest:  Old scars left anterior chest, nontender without erythema.  Left anterior lateral neck scarring without drainage MSK:   Moves extremities without difficulty  Other:    Medical Decision Making  Medically screening exam initiated at 4:52 PM.  Appropriate orders placed.  [facility] was informed that the remainder of the evaluation will be completed by another provider, this initial triage assessment does not replace that evaluation, and the importance of remaining in the ED until their evaluation is complete.  CP   Sadeen Wiegel A, PA-C 05/09/22 1653

## 2022-05-10 ENCOUNTER — Emergency Department (HOSPITAL_COMMUNITY): Payer: Medicare Other

## 2022-05-10 DIAGNOSIS — M79602 Pain in left arm: Secondary | ICD-10-CM | POA: Diagnosis not present

## 2022-05-10 DIAGNOSIS — Z79899 Other long term (current) drug therapy: Secondary | ICD-10-CM | POA: Diagnosis not present

## 2022-05-10 DIAGNOSIS — I214 Non-ST elevation (NSTEMI) myocardial infarction: Secondary | ICD-10-CM | POA: Diagnosis not present

## 2022-05-10 DIAGNOSIS — Z7982 Long term (current) use of aspirin: Secondary | ICD-10-CM | POA: Diagnosis not present

## 2022-05-10 DIAGNOSIS — R4182 Altered mental status, unspecified: Secondary | ICD-10-CM | POA: Diagnosis not present

## 2022-05-10 DIAGNOSIS — R0789 Other chest pain: Secondary | ICD-10-CM | POA: Diagnosis present

## 2022-05-10 LAB — CBG MONITORING, ED: Glucose-Capillary: 84 mg/dL (ref 70–99)

## 2022-05-10 LAB — TROPONIN I (HIGH SENSITIVITY)
Troponin I (High Sensitivity): 5223 ng/L (ref <18)
Troponin I (High Sensitivity): 10425 ng/L (ref <18)

## 2022-05-10 NOTE — ED Notes (Signed)
Report given to [facility], paperwork provided.

## 2022-05-10 NOTE — ED Notes (Signed)
Pt called out for staff, pt confused asking "why am I here, where am I". Not remembering visiting with family and his plan of care. Pt able to be reoriented. Pt states he knows he had a episode of confusion and states that he doesn't want to worry [place]. Pts baseline is alert and oriented x4

## 2022-05-10 NOTE — ED Provider Notes (Signed)
Care assumed at midnight pending transfer to [facility] for NSTEMI following recent TAVR.   Called by nursing for confusion.  Confusion had resolved at time of assessment.  He is awake and alert with no asymmetry of facial movement, visual fields grossly intact.  5/5 strength in all four extremities with sensation to light touch intact in all four extremities.  Pt states confusion occurred after waking from sleep - he did not know he was in the hospital or why he was in the hospital, which has since resolved.  In setting of heparinization will check noncon CT head to rule out acute bleed.    CT head is negative for acute process.  Plan to transfer to [facility] for ongoing cardiac evaluation and treatment.   Tilden Fossa, MD 05/10/22 [phone]

## 2022-05-13 NOTE — Discharge Summary (Signed)
 [facility] Cardiology Discharge Summary  Admit Date: 05/10/2022 Discharge Date: 05/13/2022  Admitting Physician: Charlie Prentice Latina, MD Discharge Physician: Kurt Latina, MD  Primary Care Provider: Wade, [address] [place], Phone [phone]  Discharge Destination: Home with outpatient cardiac rehab  Admission Diagnoses:  NSTEMI  Discharge Diagnoses:  Principal Problem:   NSTEMI (non-ST elevated myocardial infarction) (CMS/HHS-HCC) Active Problems:   Coronary artery disease   GERD (gastroesophageal reflux disease)   Hyperlipidemia   Hypertension   CKD (chronic kidney disease)   Chronic back pain   S/P TAVR (transcatheter aortic valve replacement) Resolved Problems:   * No resolved hospital problems. *  Primary Diagnosis: Admitted for NSTEMI, underwent PCI with DES x3 to proximal RCA, mid-distal RCA, and R-PDA.  Anticipatory Guidance for Outpatient Provider:  -Plan for 12 months DAPT then aspirin  monotherapy lifelong. -Echo with newly reduced LV systolic function. Started ACEi and MRA given LV dysfunction post-MI. -Supplemented with 1g IV iron for iron deficiency. -Referred to outpatient cardiac rehab. -Cardiology follow up is scheduled for 05/26/22.  Changes Made:  -Started eplerenone and lisinopril. -Discontinued amlodipine. -Increased metoprolol . -Started plavix. -Started PRN SLNG (and advised patient to hold viagra if nitro used). -Prescribed antimicrobial dental ppx with amoxicillin .  Recommended Follow-up Studies:  [ ]  BMP at 05/26/22 Cardiology follow up given recent med changes. [ ]  Repeat iron studies in 3 months (~mid July 2024). Request PCP attention to follow up. [ ]  Repeat echo in 3 months post PCI to evaluate for LVEF recovery.     Results Pending at Discharge: None. Please see phone numbers at end of this summary for lab contact information.   Follow-up/Care Transition Plan: Future Appointments  Date Time Provider  Department Center  05/16/2022  9:00 AM [id number] [id number] [facility]  05/16/2022 10:00 AM [facility] XR 15 [facility]  05/16/2022 10:30 AM Gaca, Reyes Prude, MD 2F2G [facility]  05/16/2022 11:15 AM Carrol Corina Grill, PA OTOLARYN [facility]  05/26/2022  8:30 AM Bari Perley Meth, NP [facility]  01/19/2023  4:00 PM Godfrey Krystal Ruth, MD [facility]   [facility] Provider Follow-up: none    Allergies/Intolerances:  Allergies  Allergen Reactions  . Codeine Itching  . Oxycodone  Hallucination     New Adverse Drug Events: none  Medications:   Current Discharge Medication List     START taking these medications   Details  clopidogreL (PLAVIX) 75 mg tablet Take 1 tablet (75 mg total) by mouth once daily Qty: 30 tablet, Refills: 5    eplerenone (INSPRA) 25 MG tablet Take 1 tablet (25 mg total) by mouth once daily for 180 days Qty: 30 tablet, Refills: 5    lisinopriL (ZESTRIL) 10 MG tablet Take 1 tablet (10 mg total) by mouth once daily Qty: 30 tablet, Refills: 5    nitroGLYcerin  (NITROSTAT ) 0.4 MG SL tablet Place 1 tablet (0.4 mg total) under the tongue as directed for Chest pain May take 1 tab every 5 minutes (up to 3 doses) for chest pain AVOID USING of sildenafil (Revatio, Viagra) or vardenafil (Levitra) have been used within 24 hours or if tadalafil (Cialis, Adcirca) has been used within 48 hours. Qty: 25 tablet, Refills: 3    amoxicillin  (AMOXIL ) 500 MG capsule Take 4 capsules (2,000 mg total) by mouth as directed (Give 1 hour prior to dental procedures) for up to 2 doses Qty: 8 capsule, Refills: 3       CONTINUE these medications which  have CHANGED   Details  aspirin  81 MG EC tablet Take 1 tablet (81 mg total) by mouth once daily Qty: 30 tablet, Refills: 5    metoprolol  succinate (TOPROL -XL) 50 MG XL tablet Take 1 tablet (50 mg total) by mouth once daily Qty: 30 tablet, Refills: 0       CONTINUE these medications which have NOT  CHANGED   Details  acetaminophen  (TYLENOL ) 500 MG tablet Take 500 mg by mouth every 6 (six) hours as needed for Pain    latanoprost (XALATAN) 0.005 % ophthalmic solution INSTILL 1 DROP IN BOTH EYES AT BEDTIME    levocetirizine (XYZAL) 5 MG tablet Take 5 mg by mouth once daily    montelukast (SINGULAIR) 10 mg tablet Take 10 mg by mouth once daily    polyethylene glycol (MIRALAX) packet Take 1 packet (17 g total) by mouth once daily as needed for Constipation (May start when taking PO) Mix in 4-8ounces of fluid prior to taking.    REPATHA  SURECLICK 140 mg/mL PnIj INJECT 1 PEN INTO THE SKIN EVERY 14 DAYS    rosuvastatin  (CRESTOR ) 40 MG tablet Take 1 tablet (40 mg total) by mouth at bedtime Qty: 30 tablet, Refills: 2    sodium chloride  (OCEAN) 0.65 % nasal spray Place 1 spray into both nostrils every 2 (two) hours Qty: 50 mL, Refills: 0    tamsulosin  (FLOMAX ) 0.4 mg capsule Take 0.8 mg by mouth once daily    testosterone  cypionate (DEPO-TESTOSTERONE ) 200 mg/mL injection Inject 200 mg into the muscle every 14 (fourteen) days    TURMERIC ORAL Take 1 capsule by mouth once daily    zolpidem  (AMBIEN ) 10 mg tablet Take 10 mg by mouth at bedtime as needed for Sleep Refills: 1    sildenafiL (VIAGRA) 100 MG tablet Take 1 tablet (100 mg total) by mouth as directed       STOP taking these medications     amLODIPine (NORVASC) 10 MG tablet      omeprazole (PRILOSEC) 20 MG DR capsule          Brief History of Present Illness:  Robert Estes is a 77 y.o. male with PMH significant for CAD s/p PCI with DES x 2 (RCA, LCx, 2010), severe AS s/p TAVR on 3/30, HLD, PAD, CKD IIIb, and chronic back pain who presents from [facility] ED with chest pain and troponin elevations concerning for NSTEMI.   Patient initially presented to [facility] ED with acute onset left arm/left-sided chest pain.  The pain began around 10 AM on 4/12 after he was lifting a car tire trying to  change it.  He had sudden onset of pain on both bicep that began to radiate to the left side of his chest.  He initially thought was musculoskeletal and took a Tylenol  and tried to rest, but he woke up with worsening pain and decided to come in for further evaluation.  At OSH, patient was initially hypertensive to 167/92, but was other otherwise hemodynamically stable.  EKG was obtained which showed sinus rhythm with ST elevation which was initially interpreted as acute pericarditis.  CXR PA and lateral showed normal heart size and mediastinal contours, clear lungs, and no evidence of pneumothorax or pleural effusion.  Initial blood work was largely unrevealing.  However initial troponin elevated to 579 with 1 hour repeat elevated to 1389 (peak prior to transfer 10,425).  [facility] was contacted at this time with concern for NSTEMI and patient transferred for continuity  of care.  Prior to transfer, patient was started on heparin  drip for treatment of NSTEMI.  Patient then became acutely altered and was questioning his location in the circumstances surrounding his hospitalization. CT head was obtained prior to transfer which did not show evidence of active bleed.   Upon arrival at [facility], temperature was 36.5 C (97.7 F), HR  , BP (!) 146/84, RR 16, and SpO2 97 % on RA. Labs were notable for sodium 138, potassium 4.5, magnesium 2.1, BUN 19, creatinine 1.8*, leukocytes 9.4, hemoglobin 15.4, and platelets 181. EKG showed NSR with early repolarization abnormalities and CXR showed no acute abnormalities . He was admitted for further evaluation and management. _____________________  Hospital Course by Problem:  #Type I Non-ST-elevation Myocardial Infarction #CAD s/p remote DES to proximal-mid RCA and to Lcx, now s/p DES x3 to proximal RCA, mid-distal RCA, and R-PDA #New LV systolic dysfunction #HTN Patient has known CAD, with prior cath in Oct 2023 at [facility] noting prior proximal-mid RCA stent with diffuse  moderate in-stent restenosis, widely patent LM, mid LAD 50%, D1 70%, LCX (dominant) with prior stent mild in-stent restenosis. Patient presented with one day of chest pain, troponin elevation to peak 10,984, and dynamic ECG ST changes. He had persistent pain despite a nitro drip so he was taken urgently to the cath lab. There was no clear culprit lesion however given burden of disease, he underwent DES x3 to proximal RCA, mid-distal RCA, and R-PDA. His chest pain largely resolved, however he did have infrequent mild non-exertional episodes of chest discomfort felt to be MSK in etiology. Echocardiogram showed newly reduced LV systolic function 45% and anterior/ lateral/ posterior hypokinesis. He was started on plavix which he will continue for 12 months and he will continue aspirin  lifelong. Home rosuvastatin  was continued. His PCSK9i is non-formulatory; he was due for this on 05/12/22 so we requested he take as soon as he gets home on 05/13/22. Given post-MI LV systolic dysfunction, he was started on eplerenone and lisinopril and his metoprolol  was increased. Home amlodipine was discontinued given addition of new antihypertensive medications. Sublingual nitroglycerin  was prescribed PRN. He was referred for cardiac rehab.   #Severe AS 2/2 BAV s/p TAVR 04/26/22 (26mm SAPIEN S3) Echo with no paravalvular leak and minimal gradient.   #GERD Continued home PRN pantoprazole .    #CKD Stage IIIb Baseline Cr 1.7-1.9 with GFR 30-40s. Kidney function remained within baseline.    #PAD Antiplatelet and antilipid therapy as above.   Surgeries and Procedures Performed:  05/10/22 Coronary angiography with PCI _____________________  Discharge Exam:  BP 98/58 (BP Location: Left upper arm, Patient Position: Sitting)   Pulse 68   Temp 36.9 C (98.4 F) (Oral)   Resp 16   Ht 167.6 cm (5' 5.98) Comment: per chart  Wt 74 kg (163 lb 2.3 oz)   SpO2 99%   BMI 26.34 kg/m    Gen: Alert, well-appearing. Sitting up in  hospital bed in NAD. HEENT: Clear conjunctiva and anicteric sclera.  MMM CV: Regular rate and rhythm. No murmurs. JVP normal.  Resp: Clear to auscultation bilaterally. Breathing comfortably on room air.  Abd: Soft, non distended, non tender to palpation Extr: Warm, well perfused, no lower extremity edema Skin: No rashes Neuro: Alert and oriented x4. CN II-XII individually examined and intact. Strength 5/5 bilateral upper and lower extremities. Sensation intact to light touch in all four extremities.  Pertinent Lab Testing: Recent Labs  Lab 05/11/22 0334 05/12/22 0430 05/13/22 0323  NA  136 138 136  K 5.2* 4.1 3.7  CL 105 109* 104  CO2 12* 17* 22  BUN 19 16 24*  CREATININE 1.8* 1.8* 1.9*  GLUCOSE 71 71 102  CALCIUM  8.6* 8.8 8.8   Recent Labs  Lab 05/10/22 0712  AST 75*  ALT 28  ALKPHOS 80  TBILI 0.8    Recent Labs  Lab 05/11/22 1055 05/12/22 0430 05/13/22 0323  WBC 7.9 6.5 4.9  HGB 13.2* 14.0 13.0*  HCT 41.6 44.3 41.5  PLT 169 160 154   Recent Labs  Lab 05/10/22 0713 05/10/22 1637  APTT 47.6* 163.9*  INR 1.1  --      Other Pertinent Labs: As above.  Pertinent Imaging:  Echo limited, 05/10/2022 MILD LV DYSFUNCTION (See above)    NORMAL RIGHT VENTRICULAR SYSTOLIC FUNCTION    LIMITED ECHO TO ASSESS LV FUNCTION AND TAVR GRADIENTS IN SETTING OF   POSSIBLE NSTEMI    Compared with prior Echo study on 04/17/2022: REDUCED LV FUNCTION WITH   REGIONAL WALL MOTION ABNORMALITIES NOW PRESENT   CATH coronary angiography, 05/10/2022 Impressions: Urgent cath for NSTEMI with ongoing chest pain Right radial artery access with ultrasound guidance No significant interval change from last cath in 2023.  No clear culprit for NSTEMI.  TIMI 3 flow in all territories.  The most severe disease is in the mid-distal RCA and codominant PDA.  Thus, we will focus on this for PCI today given baseline CKD and stable OM, diagonal disease JR4 guide Runthrough wire 2.25 balloon predilation to  proximal, mid/distal vessel and PDA IVUS shows:  2.25 PDA reference, 2.5 distal, and 3.0 mid vessel.  There is prior stent in the proximal RCA 2.25 x 15 mm Onyx DES to PDA 2.5 x 38 mm Onyx DES to mid-distal RCA at 14 atm 3.0 x 22 mm Onyx DES to proximal RCA 3.5 x 15 [place] balloon to proximal stent 2.5 x 15 [place] balloon to distal RCA stent IVUS shows:  well-expanded and apposed stents Excellent angiographic result Recommendations: TR band to right radial artery access site Aggressive secondary prevention medical therapy Aspirin +plavix dual antiplatelet therapy for 12 months with NSTEMI presentation IV fluid hydration with close monitoring of renal function given baseline CKD  _____________________  Code Status: Full Code Goals of care were not addressed during this admission.  Status on Discharge:  Cognitive: normal ADLs: normal Current activity: Walks frequently (05/13/22 0800) Current mobility: Slightly limited (05/13/22 0800)  Activity Recommendation: no heavy lifting for 2 weeks  Other Discharge Instructions: Services setup at discharge [facility] health, Nursing, Infusion, PT/OT): Referred to cardiac rehab. Wound care: none Tubes/lines at discharge: none  Discharge to Outside Facility: Behavioral issues in hospital: none Diet (including supplements/tube feeds): Diet low fat low cholesterol  _____________________  Time spent on discharge process: 45 minutes    Areatha Don, M.D. [facility] 05/13/2022    Hospital Contact Information:  [facility]) [facility]) [facility])  Pending tests:  Laboratory: [phone] Microbiology: [phone] Pathology: [phone] Radiology: [phone]  General questions: [phone] Pending tests: Laboratory: [phone] Microbiology: [phone] Pathology: [phone] Radiology: [phone]  General questions:  [phone] Pending tests:  Laboratory: [phone] Microbiology: [phone] Pathology: [phone] Radiology: [phone]  General questions:  [phone]   ------------------------------------------------------------------------------- Attestation signed by Dianah Charlie Barter, MD at 05/14/2022 12:30 AM Attestation Statement:   I personally saw and evaluated the patient, and participated in the management and  treatment plan as documented in the resident/fellow note.  CHARLIE PRENTICE LATINA, MD  -------------------------------------------------------------------------------

## 2022-05-29 ENCOUNTER — Telehealth (HOSPITAL_COMMUNITY): Payer: Self-pay

## 2022-05-29 NOTE — Telephone Encounter (Signed)
Called patient to see if he was interested in participating in the Cardiac Rehab Program. Patient stated yes. Patient will come in for orientation on 06/03/22 @ 8AM and will attend the 8:15AM exercise class.   Pensions consultant.

## 2022-05-30 ENCOUNTER — Telehealth (HOSPITAL_COMMUNITY): Payer: Self-pay

## 2022-05-30 NOTE — Telephone Encounter (Signed)
Reviewed with patient the Cardiac Rehab Cardiac Risk [place] Nursing Assessment. Patient knows where our office is located.  

## 2022-06-03 ENCOUNTER — Encounter (HOSPITAL_COMMUNITY)
Admission: RE | Admit: 2022-06-03 | Discharge: 2022-06-03 | Disposition: A | Discharge status: Home / Self Care | Payer: Medicare Other | Source: Ambulatory Visit | Attending: Cardiology | Admitting: Cardiology

## 2022-06-03 VITALS — BP 110/60 | HR 81 | Ht 68.0 in | Wt 164.9 lb

## 2022-06-03 DIAGNOSIS — Z955 Presence of coronary angioplasty implant and graft: Secondary | ICD-10-CM

## 2022-06-03 DIAGNOSIS — I214 Non-ST elevation (NSTEMI) myocardial infarction: Secondary | ICD-10-CM | POA: Diagnosis present

## 2022-06-03 DIAGNOSIS — Z952 Presence of prosthetic heart valve: Secondary | ICD-10-CM | POA: Insufficient documentation

## 2022-06-03 NOTE — Progress Notes (Signed)
Cardiac Individual Treatment Plan  Patient Details  Name: Robert Estes MRN: [id number] Date of Birth: Jun 15, 1945 Referring Provider:   Flowsheet Row INTENSIVE CARDIAC REHAB ORIENT from 06/03/2022 in [facility]  Referring Provider Dr. Keitha Butte, MD covering)       Initial Encounter Date:  Flowsheet Row INTENSIVE CARDIAC REHAB ORIENT from 06/03/2022 in [facility]  Date 06/03/22       Visit Diagnosis: 05/10/22 NSTEMI at [facility]  05/10/22 S/P DES x 3 at [facility]  04/16/22 S/P TAVR at  [facility]  Patient's Home Medications on Admission:  Current Outpatient Medications:    acetaminophen (TYLENOL) 500 MG tablet, Take 500 mg by mouth every 6 (six) hours as needed for moderate pain., Disp: , Rfl:    amoxicillin (AMOXIL) 500 MG capsule, Take 500 mg by mouth as directed. Take 4 capsules by mouth as needed for dental procedures, Disp: , Rfl:    aspirin 81 MG tablet, Take 81 mg by mouth daily., Disp: , Rfl:    clopidogrel (PLAVIX) 75 MG tablet, Take 75 mg by mouth daily., Disp: , Rfl:    eplerenone (INSPRA) 25 MG tablet, Take 25 mg by mouth daily., Disp: , Rfl:    Evolocumab (REPATHA SURECLICK) 140 MG/ML SOAJ, INJECT 1 PEN INTO THE SKIN EVERY 14 DAYS, Disp: 2 mL, Rfl: 11   latanoprost (XALATAN) 0.005 % ophthalmic solution, Place 1 drop into both eyes at bedtime., Disp: , Rfl:    levocetirizine (XYZAL) 5 MG tablet, Take 5 mg by mouth daily., Disp: , Rfl:    lisinopril (ZESTRIL) 10 MG tablet, Take 1 tablet by mouth daily., Disp: , Rfl:    metoprolol succinate (TOPROL-XL) 50 MG 24 hr tablet, Take 50 mg by mouth daily., Disp: , Rfl:    montelukast (SINGULAIR) 10 MG tablet, Take 10 mg by mouth at bedtime., Disp: , Rfl:    nitroGLYCERIN (NITROSTAT) 0.4 MG SL tablet, Place 1 tablet (0.4 mg total) under the tongue every 5 (five) minutes as needed for chest  pain., Disp: 25 tablet, Rfl: 11   polyethylene glycol (MIRALAX / GLYCOLAX) 17 g packet, Take 17 g by mouth daily as needed for mild constipation., Disp: , Rfl:    rosuvastatin (CRESTOR) 40 MG tablet, Take 1 tablet by mouth at bedtime., Disp: , Rfl:    sodium chloride (OCEAN) 0.65 % SOLN nasal spray, Place 1 spray into both nostrils as needed for congestion., Disp: , Rfl:    tamsulosin (FLOMAX) 0.4 MG CAPS capsule, Take 0.4 mg by mouth daily., Disp: , Rfl:    testosterone cypionate (DEPOTESTOSTERONE CYPIONATE) 200 MG/ML injection, Inject 200 mg into the muscle every 14 (fourteen) days., Disp: , Rfl:    TURMERIC PO, Take 2 capsules by mouth daily., Disp: , Rfl:    zolpidem (AMBIEN) 10 MG tablet, TAKE 1 TABLET(10 MG) BY MOUTH AT BEDTIME AS NEEDED FOR SLEEP, Disp: 30 tablet, Rfl: 0   Multiple Vitamin (MULTIVITAMIN) tablet, Take 1 tablet by mouth daily. (Patient not taking: Reported on 06/03/2022), Disp: , Rfl:   Past Medical History: Past Medical History:  Diagnosis Date   Arthritis    "hands, knees, elbows, fingers" (01/25/2013)   Coronary artery disease    HX OF HEART STENTS X 2   2010--DR. H. SMITH IS PT'S CARDIOLOGIST, MILD AORTIC STENOSIS   Dizziness    Enlarged prostate    FLOMAX HELPS  PT VOID   Exertional shortness of breath    GERD (gastroesophageal reflux disease)    OTC PREVACID AS NEEDED   Glaucoma    BOTH EYES   Gout    Hyperlipidemia    Hypertension    Hypertension    Iron deficiency anemia    hx   Migraine    "monthly" (01/25/2013)   Peripheral vascular disease (HCC)    CLAUDICATION    Tobacco Use: Social History   Tobacco Use  Smoking Status Never  Smokeless Tobacco Never    Labs: Review Flowsheet  More data exists      Latest Ref Rng & Units 09/22/2018 11/22/2018 08/22/2019 02/20/2020 11/13/2021  Labs for ITP Cardiac and Pulmonary Rehab  Cholestrol 100 - 199 mg/dL 130  865  784  696  -  LDL (calc) 0 - 99 mg/dL 71  79  45  88  -  HDL-C >39 mg/dL 33  36  38   34  -  Trlycerides 0 - 149 mg/dL 295  284  132  440  -  PH, Arterial 7.35 - 7.45 - - - - 7.376   PCO2 arterial 32 - 48 mmHg - - - - 37.8   Bicarbonate 20.0 - 28.0 mmol/L - - - - 22.2  24.6  22.9   TCO2 22 - 32 mmol/L - - - - 23  26  24    Acid-base deficit 0.0 - 2.0 mmol/L - - - - 3.0  1.0  2.0   O2 Saturation % - - - - 95  77  79     Capillary Blood Glucose: Lab Results  Component Value Date   GLUCAP 84 05/10/2022   GLUCAP 137 (H) 09/03/2017     Exercise Target Goals: Exercise Program Goal: Individual exercise prescription set using results from initial 6 min walk test and THRR while considering  patient's activity barriers and safety.   Exercise Prescription Goal: Initial exercise prescription builds to 30-45 minutes a day of aerobic activity, 2-3 days per week.  Home exercise guidelines will be given to patient during program as part of exercise prescription that the participant will acknowledge.  Activity Barriers & Risk Stratification:  Activity Barriers & Cardiac Risk Stratification - 06/03/22 0813       Activity Barriers & Cardiac Risk Stratification   Activity Barriers Back Problems;Arthritis;Neck/Spine Problems;Joint Problems;Deconditioning;Muscular Weakness;Chest Pain/Angina;Balance Concerns    Cardiac Risk Stratification High             6 Minute Walk:  6 Minute Walk     Row Name 06/03/22 0919         6 Minute Walk   Phase Initial     Distance 1583 feet     Walk Time 6 minutes     # of Rest Breaks 0     MPH 3     METS 3.5     RPE 7     Perceived Dyspnea  0     VO2 Peak 12.1     Symptoms Yes (comment)     Comments Left great toe pain 2/10 (gout)     Resting HR 73 bpm     Resting BP 110/60     Resting Oxygen Saturation  98 %     Exercise Oxygen Saturation  during 6 min walk 100 %     Max Ex. HR 112 bpm     Max Ex. BP 130/72     2 Minute Post BP 122/70  Oxygen Initial Assessment:   Oxygen Re-Evaluation:   Oxygen  Discharge (Final Oxygen Re-Evaluation):   Initial Exercise Prescription:  Initial Exercise Prescription - 06/03/22 0900       Date of Initial Exercise RX and Referring Provider   Date 06/03/22    Referring Provider Dr. Keitha Butte, MD covering)    Expected Discharge Date 08/15/22      NuStep   Level 2    SPM 80    Minutes 15    METs 3.5      Arm Ergometer   Level 1.5    Watts 25    RPM 60    Minutes 15    METs 3.5      Prescription Details   Frequency (times per week) 3    Duration Progress to 30 minutes of continuous aerobic without signs/symptoms of physical distress      Intensity   THRR 40-80% of Max Heartrate 58-117    Ratings of Perceived Exertion 11-13    Perceived Dyspnea 0-4      Progression   Progression Continue progressive overload as per policy without signs/symptoms or physical distress.      Resistance Training   Training Prescription Yes    Weight 3 lbs    Reps 10-15             Perform Capillary Blood Glucose checks as needed.  Exercise Prescription Changes:   Exercise Comments:   Exercise Goals and Review:   Exercise Goals     Row Name 06/03/22 8119             Exercise Goals   Increase Physical Activity Yes       Intervention Provide advice, education, support and counseling about physical activity/exercise needs.;Develop an individualized exercise prescription for aerobic and resistive training based on initial evaluation findings, risk stratification, comorbidities and participant's personal goals.       Expected Outcomes Short Term: Attend rehab on a regular basis to increase amount of physical activity.;Long Term: Add in home exercise to make exercise part of routine and to increase amount of physical activity.;Long Term: Exercising regularly at least 3-5 days a week.       Increase Strength and Stamina Yes       Intervention Provide advice, education, support and counseling about physical activity/exercise  needs.;Develop an individualized exercise prescription for aerobic and resistive training based on initial evaluation findings, risk stratification, comorbidities and participant's personal goals.       Expected Outcomes Short Term: Increase workloads from initial exercise prescription for resistance, speed, and METs.;Short Term: Perform resistance training exercises routinely during rehab and add in resistance training at home;Long Term: Improve cardiorespiratory fitness, muscular endurance and strength as measured by increased METs and functional capacity ( )       Able to understand and use rate of perceived exertion (RPE) scale Yes       Intervention Provide education and explanation on how to use RPE scale       Expected Outcomes Short Term: Able to use RPE daily in rehab to express subjective intensity level;Long Term:  Able to use RPE to guide intensity level when exercising independently       Knowledge and understanding of Target Heart Rate Range (THRR) Yes       Intervention Provide education and explanation of THRR including how the numbers were predicted and where they are located for reference       Expected Outcomes Short Term: Able to state/look up  THRR;Short Term: Able to use daily as guideline for intensity in rehab;Long Term: Able to use THRR to govern intensity when exercising independently       Understanding of Exercise Prescription Yes       Intervention Provide education, explanation, and written materials on patient's individual exercise prescription       Expected Outcomes Short Term: Able to explain program exercise prescription;Long Term: Able to explain home exercise prescription to exercise independently                Exercise Goals Re-Evaluation :   Discharge Exercise Prescription (Final Exercise Prescription Changes):   Nutrition:  Target Goals: Understanding of nutrition guidelines, daily intake of sodium 1500mg , cholesterol 200mg , calories 30% from fat  and 7% or less from saturated fats, daily to have 5 or more servings of fruits and vegetables.  Biometrics:  Pre Biometrics - 06/03/22 0845       Pre Biometrics   Waist Circumference 36 inches    Hip Circumference 39.5 inches    Waist to Hip Ratio 0.91 %    Triceps Skinfold 12 mm    % Body Fat 20.6 %    Grip Strength 42 kg    Flexibility 14.5 in    Single Leg Stand 6.78 seconds              Nutrition Therapy Plan and Nutrition Goals:   Nutrition Assessments:  MEDIFICTS Score Key: ?70 Need to make dietary changes  40-70 Heart Healthy Diet ? 40 Therapeutic Level Cholesterol Diet    Picture Your Plate Scores: <53 Unhealthy dietary pattern with much room for improvement. 41-50 Dietary pattern unlikely to meet recommendations for good health and room for improvement. 51-60 More healthful dietary pattern, with some room for improvement.  >60 Healthy dietary pattern, although there may be some specific behaviors that could be improved.    Nutrition Goals Re-Evaluation:   Nutrition Goals Re-Evaluation:   Nutrition Goals Discharge (Final Nutrition Goals Re-Evaluation):   Psychosocial: Target Goals: Acknowledge presence or absence of significant depression and/or stress, maximize coping skills, provide positive support system. Participant is able to verbalize types and ability to use techniques and skills needed for reducing stress and depression.  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 Has not returned to work yet. Pt declines counseling at this time, but voices he knows he can if needed.      Family Dynamics   Good Support System? Yes    Comments Wife and son are pt's support      Barriers   Psychosocial barriers to participate in program The  patient should benefit from training in stress management and relaxation.      Screening Interventions   Interventions Encouraged to exercise    Expected Outcomes Short Term goal: Identification and review with participant of any Quality of Life or Depression concerns found by scoring the questionnaire.;Long Term goal: The participant improves quality of Life and PHQ9 Scores as seen by post scores and/or verbalization of changes;Long Term Goal: Stressors or current issues are controlled or eliminated.             Quality of Life Scores:  Quality of Life - 06/03/22 1248       Quality  of Life   Select Quality of Life      Quality of Life Scores   Health/Function Pre 12.43 %    Socioeconomic Pre 19.75 %    Psych/Spiritual Pre 23.57 %    Family Pre 16.9 %    GLOBAL Pre 16.97 %            Scores of 19 and below usually indicate a poorer quality of life in these areas.  A difference of  2-3 points is a clinically meaningful difference.  A difference of 2-3 points in the total score of the Quality of Life Index has been associated with significant improvement in overall quality of life, self-image, physical symptoms, and general health in studies assessing change in quality of life.  PHQ-9: Review Flowsheet  More data exists      06/03/2022 02/16/2020 08/22/2019 04/04/2019 03/08/2019  Depression screen PHQ 2/9  Decreased Interest 1 0 0 0 0  Down, Depressed, Hopeless 2 0 0 0 0  PHQ - 2 Score 3 0 0 0 0  Altered sleeping 1 - - - -  Tired, decreased energy 1 - - - -  Change in appetite 0 - - - -  Feeling bad or failure about yourself  2 - - - -  Trouble concentrating 1 - - - -  Moving slowly or fidgety/restless 2 - - - -  Suicidal thoughts 0 - - - -  PHQ-9 Score 10 - - - -  Difficult doing work/chores Not difficult at all - - - -   Interpretation of Total Score  Total Score Depression Severity:  1-4 = Minimal depression, 5-9 = Mild depression, 10-14 = Moderate depression, 15-19 =  Moderately severe depression, 20-27 = Severe depression   Psychosocial Evaluation and Intervention:   Psychosocial Re-Evaluation:   Psychosocial Discharge (Final Psychosocial Re-Evaluation):   Vocational Rehabilitation: Provide vocational rehab assistance to qualifying candidates.   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            Education: Education Goals: Education classes will be provided on a weekly basis, covering required topics. Participant will state understanding/return demonstration of topics presented.     Core Videos: Exercise    Move It!  Clinical staff conducted group or individual video education with verbal and written material and guidebook.  Patient learns the recommended Pritikin exercise program. Exercise with the goal of living a long, healthy life. Some of the health benefits of exercise include controlled diabetes, healthier blood pressure levels, improved cholesterol levels, improved heart and lung capacity, improved sleep, and better body composition. Everyone should speak with their doctor before starting or changing an exercise routine.  Biomechanical Limitations Clinical staff conducted group or individual video education with verbal and written material and guidebook.  Patient learns how biomechanical limitations can impact exercise and how we can mitigate and possibly overcome limitations to have an impactful and balanced exercise routine.  Body Composition Clinical staff conducted group or individual video education with verbal and written material and guidebook.  Patient learns that body composition (ratio of muscle mass to fat mass) is a key component to assessing overall fitness, rather than body weight alone. Increased fat  mass, especially visceral belly fat, can put [place] at increased risk for  metabolic syndrome, type 2 diabetes, heart disease, and even death. It is recommended to combine diet and exercise (cardiovascular and resistance training) to improve your body composition. Seek guidance from your physician and exercise physiologist before implementing an exercise routine.  Exercise Action Plan Clinical staff conducted group or individual video education with verbal and written material and guidebook.  Patient learns the recommended strategies to achieve and enjoy long-term exercise adherence, including variety, self-motivation, self-efficacy, and positive decision making. Benefits of exercise include fitness, good health, weight management, more energy, better sleep, less stress, and overall well-being.  Medical   Heart Disease Risk Reduction Clinical staff conducted group or individual video education with verbal and written material and guidebook.  Patient learns our heart is our most vital organ as it circulates oxygen, nutrients, white blood cells, and hormones throughout the entire body, and carries waste away. Data supports a plant-based eating plan like the Pritikin Program for its effectiveness in slowing progression of and reversing heart disease. The video provides a number of recommendations to address heart disease.   Metabolic Syndrome and Belly Fat  Clinical staff conducted group or individual video education with verbal and written material and guidebook.  Patient learns what metabolic syndrome is, how it leads to heart disease, and how one can reverse it and keep it from coming back. You have metabolic syndrome if you have 3 of the following 5 criteria: abdominal obesity, high blood pressure, high triglycerides, low HDL cholesterol, and high blood sugar.  Hypertension and Heart Disease Clinical staff conducted group or individual video education with verbal and written material and guidebook.   Patient learns that high blood pressure, or hypertension, is very common in the [place]. Hypertension is largely due to excessive salt intake, but other important risk factors include being overweight, physical inactivity, drinking too much alcohol, smoking, and not eating enough potassium from fruits and vegetables. High blood pressure is a leading risk factor for heart attack, stroke, congestive heart failure, dementia, kidney failure, and premature death. Long-term effects of excessive salt intake include stiffening of the arteries and thickening of heart muscle and organ damage. Recommendations include ways to reduce hypertension and the risk of heart disease.  Diseases of Our Time - Focusing on Diabetes Clinical staff conducted group or individual video education with verbal and written material and guidebook.  Patient learns why the best way to stop diseases of our time is prevention, through food and other lifestyle changes. Medicine (such as prescription pills and surgeries) is often only a Band-Aid on the problem, not a long-term solution. Most common diseases of our time include obesity, type 2 diabetes, hypertension, heart disease, and cancer. The Pritikin Program is recommended and has been proven to help reduce, reverse, and/or prevent the damaging effects of metabolic syndrome.  Nutrition   Overview of the Pritikin Eating Plan  Clinical staff conducted group or individual video education with verbal and written material and guidebook.  Patient learns about the Pritikin Eating Plan for disease risk reduction. The Pritikin Eating Plan emphasizes a wide variety of unrefined, minimally-processed carbohydrates, like fruits, vegetables, whole grains, and legumes. Go, Caution, and Stop food choices are explained. Plant-based and lean animal proteins are emphasized. Rationale provided for low sodium intake for blood pressure control, low added sugars for blood sugar stabilization, and low  added fats and oils for coronary artery disease risk reduction and weight management.  Calorie Density  Clinical staff conducted group or individual video education with verbal and written material  and guidebook.  Patient learns about calorie density and how it impacts the Pritikin Eating Plan. Knowing the characteristics of the food you choose will help you decide whether those foods will lead to weight gain or weight loss, and whether you want to consume more or less of them. Weight loss is usually a side effect of the Pritikin Eating Plan because of its focus on low calorie-dense foods.  Label Reading  Clinical staff conducted group or individual video education with verbal and written material and guidebook.  Patient learns about the Pritikin recommended label reading guidelines and corresponding recommendations regarding calorie density, added sugars, sodium content, and whole grains.  Dining Out - Part 1  Clinical staff conducted group or individual video education with verbal and written material and guidebook.  Patient learns that restaurant meals can be sabotaging because they can be so high in calories, fat, sodium, and/or sugar. Patient learns recommended strategies on how to positively address this and avoid unhealthy pitfalls.  Facts on Fats  Clinical staff conducted group or individual video education with verbal and written material and guidebook.  Patient learns that lifestyle modifications can be just as effective, if not more so, as many medications for lowering your risk of heart disease. A Pritikin lifestyle can help to reduce your risk of inflammation and atherosclerosis (cholesterol build-up, or plaque, in the artery walls). Lifestyle interventions such as dietary choices and physical activity address the cause of atherosclerosis. A review of the types of fats and their impact on blood cholesterol levels, along with dietary recommendations to reduce fat intake is also  included.  Nutrition Action Plan  Clinical staff conducted group or individual video education with verbal and written material and guidebook.  Patient learns how to incorporate Pritikin recommendations into their lifestyle. Recommendations include planning and keeping personal health goals in mind as an important part of their success.  Healthy Mind-Set    Healthy Minds, Bodies, Hearts  Clinical staff conducted group or individual video education with verbal and written material and guidebook.  Patient learns how to identify when they are stressed. Video will discuss the impact of that stress, as well as the many benefits of stress management. Patient will also be introduced to stress management techniques. The way we think, act, and feel has an impact on our hearts.  How Our Thoughts Can Heal Our Hearts  Clinical staff conducted group or individual video education with verbal and written material and guidebook.  Patient learns that negative thoughts can cause depression and anxiety. This can result in negative lifestyle behavior and serious health problems. Cognitive behavioral therapy is an effective method to help control our thoughts in order to change and improve our emotional outlook.  Additional Videos:  Exercise    Improving Performance  Clinical staff conducted group or individual video education with verbal and written material and guidebook.  Patient learns to use a non-linear approach by alternating intensity levels and lengths of time spent exercising to help burn more calories and lose more body fat. Cardiovascular exercise helps improve heart health, metabolism, hormonal balance, blood sugar control, and recovery from fatigue. Resistance training improves strength, endurance, balance, coordination, reaction time, metabolism, and muscle mass. Flexibility exercise improves circulation, posture, and balance. Seek guidance from your physician and exercise physiologist before  implementing an exercise routine and learn your capabilities and proper form for all exercise.  Introduction to Yoga  Clinical staff conducted group or individual video education with verbal and written material and guidebook.  Patient learns about yoga, a discipline of the coming together of mind, breath, and body. The benefits of yoga include improved flexibility, improved range of motion, better posture and core strength, increased lung function, weight loss, and positive self-image. Yoga's heart health benefits include lowered blood pressure, healthier heart rate, decreased cholesterol and triglyceride levels, improved immune function, and reduced stress. Seek guidance from your physician and exercise physiologist before implementing an exercise routine and learn your capabilities and proper form for all exercise.  Medical   Aging: Enhancing Your Quality of Life  Clinical staff conducted group or individual video education with verbal and written material and guidebook.  Patient learns key strategies and recommendations to stay in good physical health and enhance quality of life, such as prevention strategies, having an advocate, securing a Health Care Proxy and Power of Attorney, and keeping a list of medications and system for tracking them. It also discusses how to avoid risk for bone loss.  Biology of Weight Control  Clinical staff conducted group or individual video education with verbal and written material and guidebook.  Patient learns that weight gain occurs because we consume more calories than we burn (eating more, moving less). Even if your body weight is normal, you may have higher ratios of fat compared to muscle mass. Too much body fat puts you at increased risk for cardiovascular disease, heart attack, stroke, type 2 diabetes, and obesity-related cancers. In addition to exercise, following the Pritikin Eating Plan can help reduce your risk.  Decoding Lab Results  Clinical staff  conducted group or individual video education with verbal and written material and guidebook.  Patient learns that lab test reflects one measurement whose values change over time and are influenced by many factors, including medication, stress, sleep, exercise, food, hydration, pre-existing medical conditions, and more. It is recommended to use the knowledge from this video to become more involved with your lab results and evaluate your numbers to speak with your doctor.   Diseases of Our Time - Overview  Clinical staff conducted group or individual video education with verbal and written material and guidebook.  Patient learns that according to the CDC, 50% to 70% of chronic diseases (such as obesity, type 2 diabetes, elevated lipids, hypertension, and heart disease) are avoidable through lifestyle improvements including healthier food choices, listening to satiety cues, and increased physical activity.  Sleep Disorders Clinical staff conducted group or individual video education with verbal and written material and guidebook.  Patient learns how good quality and duration of sleep are important to overall health and well-being. Patient also learns about sleep disorders and how they impact health along with recommendations to address them, including discussing with a physician.  Nutrition  Dining Out - Part 2 Clinical staff conducted group or individual video education with verbal and written material and guidebook.  Patient learns how to plan ahead and communicate in order to maximize their dining experience in a healthy and nutritious manner. Included are recommended food choices based on the type of restaurant the patient is visiting.   Fueling a Banker conducted group or individual video education with verbal and written material and guidebook.  There is a strong connection between our food choices and our health. Diseases like obesity and type 2 diabetes are very  prevalent and are in large-part due to lifestyle choices. The Pritikin Eating Plan provides plenty of food and hunger-curbing satisfaction. It is easy to follow, affordable, and helps reduce health risks.  Menu  Workshop  Engineer, materials conducted group or individual video education with verbal and written material and guidebook.  Patient learns that restaurant meals can sabotage health goals because they are often packed with calories, fat, sodium, and sugar. Recommendations include strategies to plan ahead and to communicate with the manager, chef, or server to help order a healthier meal.  Planning Your Eating Strategy  Clinical staff conducted group or individual video education with verbal and written material and guidebook.  Patient learns about the Pritikin Eating Plan and its benefit of reducing the risk of disease. The Pritikin Eating Plan does not focus on calories. Instead, it emphasizes high-quality, nutrient-rich foods. By knowing the characteristics of the foods, we choose, we can determine their calorie density and make informed decisions.  Targeting Your Nutrition Priorities  Clinical staff conducted group or individual video education with verbal and written material and guidebook.  Patient learns that lifestyle habits have a tremendous impact on disease risk and progression. This video provides eating and physical activity recommendations based on your personal health goals, such as reducing LDL cholesterol, losing weight, preventing or controlling type 2 diabetes, and reducing high blood pressure.  Vitamins and Minerals  Clinical staff conducted group or individual video education with verbal and written material and guidebook.  Patient learns different ways to obtain key vitamins and minerals, including through a recommended healthy diet. It is important to discuss all supplements you take with your doctor.   Healthy Mind-Set    Smoking Cessation  Clinical staff conducted group  or individual video education with verbal and written material and guidebook.  Patient learns that cigarette smoking and tobacco addiction pose a serious health risk which affects millions of people. Stopping smoking will significantly reduce the risk of heart disease, lung disease, and many forms of cancer. Recommended strategies for quitting are covered, including working with your doctor to develop a successful plan.  Culinary   Becoming a Set designer conducted group or individual video education with verbal and written material and guidebook.  Patient learns that cooking at home can be healthy, cost-effective, quick, and puts them in control. Keys to cooking healthy recipes will include looking at your recipe, assessing your equipment needs, planning ahead, making it simple, choosing cost-effective seasonal ingredients, and limiting the use of added fats, salts, and sugars.  Cooking - Breakfast and Snacks  Clinical staff conducted group or individual video education with verbal and written material and guidebook.  Patient learns how important breakfast is to satiety and nutrition through the entire day. Recommendations include key foods to eat during breakfast to help stabilize blood sugar levels and to prevent overeating at meals later in the day. Planning ahead is also a key component.  Cooking - Educational psychologist conducted group or individual video education with verbal and written material and guidebook.  Patient learns eating strategies to improve overall health, including an approach to cook more at home. Recommendations include thinking of animal protein as a side on your plate rather than center stage and focusing instead on lower calorie dense options like vegetables, fruits, whole grains, and plant-based proteins, such as beans. Making sauces in large quantities to freeze for later and leaving the skin on your vegetables are also recommended to maximize  your experience.  Cooking - Healthy Salads and Dressing Clinical staff conducted group or individual video education with verbal and written material and guidebook.  Patient learns that vegetables, fruits, whole grains, and legumes are  the foundations of the Pritikin Eating Plan. Recommendations include how to incorporate each of these in flavorful and healthy salads, and how to create homemade salad dressings. Proper handling of ingredients is also covered. Cooking - Soups and [employer] - Soups and Desserts Clinical staff conducted group or individual video education with verbal and written material and guidebook.  Patient learns that Pritikin soups and desserts make for easy, nutritious, and delicious snacks and meal components that are low in sodium, fat, sugar, and calorie density, while high in vitamins, minerals, and filling fiber. Recommendations include simple and healthy ideas for soups and desserts.   Overview     The Pritikin Solution Program Overview Clinical staff conducted group or individual video education with verbal and written material and guidebook.  Patient learns that the results of the Pritikin Program have been documented in more than 100 articles published in peer-reviewed journals, and the benefits include reducing risk factors for (and, in some cases, even reversing) high cholesterol, high blood pressure, type 2 diabetes, obesity, and more! An overview of the three key pillars of the Pritikin Program will be covered: eating well, doing regular exercise, and having a healthy mind-set.  WORKSHOPS  Exercise: Exercise Basics: Building Your Action Plan Clinical staff led group instruction and group discussion with PowerPoint presentation and patient guidebook. To enhance the learning environment the use of posters, models and videos may be added. At the conclusion of this workshop, patients will comprehend the difference between physical activity and exercise, as well  as the benefits of incorporating both, into their routine. Patients will understand the FITT (Frequency, Intensity, Time, and Type) principle and how to use it to build an exercise action plan. In addition, safety concerns and other considerations for exercise and cardiac rehab will be addressed by the presenter. The purpose of this lesson is to promote a comprehensive and effective weekly exercise routine in order to improve patients' overall level of fitness.   Managing Heart Disease: Your Path to a Healthier Heart Clinical staff led group instruction and group discussion with PowerPoint presentation and patient guidebook. To enhance the learning environment the use of posters, models and videos may be added.At the conclusion of this workshop, patients will understand the anatomy and physiology of the heart. Additionally, they will understand how Pritikin's three pillars impact the risk factors, the progression, and the management of heart disease.  The purpose of this lesson is to provide a high-level overview of the heart, heart disease, and how the Pritikin lifestyle positively impacts risk factors.  Exercise Biomechanics Clinical staff led group instruction and group discussion with PowerPoint presentation and patient guidebook. To enhance the learning environment the use of posters, models and videos may be added. Patients will learn how the structural parts of their bodies function and how these functions impact their daily activities, movement, and exercise. Patients will learn how to promote a neutral spine, learn how to manage pain, and identify ways to improve their physical movement in order to promote healthy living. The purpose of this lesson is to expose patients to common physical limitations that impact physical activity. Participants will learn practical ways to adapt and manage aches and pains, and to minimize their effect on regular exercise. Patients will learn how to  maintain good posture while sitting, walking, and lifting.  Balance Training and Fall Prevention  Clinical staff led group instruction and group discussion with PowerPoint presentation and patient guidebook. To enhance the learning environment the use of posters, models  and videos may be added. At the conclusion of this workshop, patients will understand the importance of their sensorimotor skills (vision, proprioception, and the vestibular system) in maintaining their ability to balance as they age. Patients will apply a variety of balancing exercises that are appropriate for their current level of function. Patients will understand the common causes for poor balance, possible solutions to these problems, and ways to modify their physical environment in order to minimize their fall risk. The purpose of this lesson is to teach patients about the importance of maintaining balance as they age and ways to minimize their risk of falling.  WORKSHOPS   Nutrition:  Fueling a Ship broker led group instruction and group discussion with PowerPoint presentation and patient guidebook. To enhance the learning environment the use of posters, models and videos may be added. Patients will review the foundational principles of the Pritikin Eating Plan and understand what constitutes a serving size in each of the food groups. Patients will also learn Pritikin-friendly foods that are better choices when away from home and review make-ahead meal and snack options. Calorie density will be reviewed and applied to three nutrition priorities: weight maintenance, weight loss, and weight gain. The purpose of this lesson is to reinforce (in a group setting) the key concepts around what patients are recommended to eat and how to apply these guidelines when away from home by planning and selecting Pritikin-friendly options. Patients will understand how calorie density may be adjusted for different weight management  goals.  Mindful Eating  Clinical staff led group instruction and group discussion with PowerPoint presentation and patient guidebook. To enhance the learning environment the use of posters, models and videos may be added. Patients will briefly review the concepts of the Pritikin Eating Plan and the importance of low-calorie dense foods. The concept of mindful eating will be introduced as well as the importance of paying attention to internal hunger signals. Triggers for non-hunger eating and techniques for dealing with triggers will be explored. The purpose of this lesson is to provide patients with the opportunity to review the basic principles of the Pritikin Eating Plan, discuss the value of eating mindfully and how to measure internal cues of hunger and fullness using the Hunger Scale. Patients will also discuss reasons for non-hunger eating and learn strategies to use for controlling emotional eating.  Targeting Your Nutrition Priorities Clinical staff led group instruction and group discussion with PowerPoint presentation and patient guidebook. To enhance the learning environment the use of posters, models and videos may be added. Patients will learn how to determine their genetic susceptibility to disease by reviewing their family history. Patients will gain insight into the importance of diet as part of an overall healthy lifestyle in mitigating the impact of genetics and other environmental insults. The purpose of this lesson is to provide patients with the opportunity to assess their personal nutrition priorities by looking at their family history, their own health history and current risk factors. Patients will also be able to discuss ways of prioritizing and modifying the Pritikin Eating Plan for their highest risk areas  Menu  Clinical staff led group instruction and group discussion with PowerPoint presentation and patient guidebook. To enhance the learning environment the use of posters,  models and videos may be added. Using menus brought in from [employer], or printed from [employer], patients will apply the Pritikin dining out guidelines that were presented in the [employer] video. Patients will also be  able to practice these guidelines in a variety of provided scenarios. The purpose of this lesson is to provide patients with the opportunity to practice hands-on learning of the Pritikin Dining Out guidelines with actual menus and practice scenarios.  Label Reading Clinical staff led group instruction and group discussion with PowerPoint presentation and patient guidebook. To enhance the learning environment the use of posters, models and videos may be added. Patients will review and discuss the Pritikin label reading guidelines presented in Pritikin's Label Reading Educational series video. Using fool labels brought in from local grocery stores and markets, patients will apply the label reading guidelines and determine if the packaged food meet the Pritikin guidelines. The purpose of this lesson is to provide patients with the opportunity to review, discuss, and practice hands-on learning of the Pritikin Label Reading guidelines with actual packaged food labels. Cooking School  Pritikin's [employer] are designed to teach patients ways to prepare quick, simple, and affordable recipes at home. The importance of nutrition's role in chronic disease risk reduction is reflected in its emphasis in the overall Pritikin program. By learning how to prepare essential core Pritikin Eating Plan recipes, patients will increase control over what they eat; be able to customize the flavor of foods without the use of added salt, sugar, or fat; and improve the quality of the food they consume. By learning a set of core recipes which are easily assembled, quickly prepared, and affordable, patients are more likely to prepare more healthy foods at home. These workshops  focus on convenient breakfasts, simple entres, side dishes, and desserts which can be prepared with minimal effort and are consistent with nutrition recommendations for cardiovascular risk reduction. Cooking [employer] are taught by a Armed forces logistics/support/administrative officer (RD) who has been trained by the [employer]. The chef or RD has a clear understanding of the importance of minimizing - if not completely eliminating - added fat, sugar, and sodium in recipes. Throughout the series of Cooking School Workshop sessions, patients will learn about healthy ingredients and efficient methods of cooking to build confidence in their capability to prepare    Cooking School weekly topics:  Adding Flavor- Sodium-Free  Fast and Healthy Breakfasts  Powerhouse Plant-Based Proteins  Satisfying Salads and Dressings  Simple Sides and Sauces  International Cuisine-Spotlight on the [employer] Zones  Delicious Desserts  Savory Soups  [employer] - Meals in a Astronomer Appetizers and Snacks  Comforting Weekend Breakfasts  One-Pot Wonders   Fast Evening Meals  Landscape architect Your Pritikin Plate  WORKSHOPS   Healthy Mindset (Psychosocial):  Focused Goals, Sustainable Changes Clinical staff led group instruction and group discussion with PowerPoint presentation and patient guidebook. To enhance the learning environment the use of posters, models and videos may be added. Patients will be able to apply effective goal setting strategies to establish at least one personal goal, and then take consistent, meaningful action toward that goal. They will learn to identify common barriers to achieving personal goals and develop strategies to overcome them. Patients will also gain an understanding of how our mind-set can impact our ability to achieve goals and the importance of cultivating a positive and growth-oriented mind-set. The purpose of this lesson is to provide patients with a deeper  understanding of how to set and achieve personal goals, as well as the tools and strategies needed to overcome common obstacles which may arise along the way.  From Head to Heart: The Power of  a Healthy Outlook  Clinical staff led group instruction and group discussion with PowerPoint presentation and patient guidebook. To enhance the learning environment the use of posters, models and videos may be added. Patients will be able to recognize and describe the impact of emotions and mood on physical health. They will discover the importance of self-care and explore self-care practices which may work for them. Patients will also learn how to utilize the 4 C's to cultivate a healthier outlook and better manage stress and challenges. The purpose of this lesson is to demonstrate to patients how a healthy outlook is an essential part of maintaining good health, especially as they continue their cardiac rehab journey.  Healthy Sleep for a Healthy Heart Clinical staff led group instruction and group discussion with PowerPoint presentation and patient guidebook. To enhance the learning environment the use of posters, models and videos may be added. At the conclusion of this workshop, patients will be able to demonstrate knowledge of the importance of sleep to overall health, well-being, and quality of life. They will understand the symptoms of, and treatments for, common sleep disorders. Patients will also be able to identify daytime and nighttime behaviors which impact sleep, and they will be able to apply these tools to help manage sleep-related challenges. The purpose of this lesson is to provide patients with a general overview of sleep and outline the importance of quality sleep. Patients will learn about a few of the most common sleep disorders. Patients will also be introduced to the concept of "sleep hygiene," and discover ways to self-manage certain sleeping problems through simple daily behavior changes.  Finally, the workshop will motivate patients by clarifying the links between quality sleep and their goals of heart-healthy living.   Recognizing and Reducing Stress Clinical staff led group instruction and group discussion with PowerPoint presentation and patient guidebook. To enhance the learning environment the use of posters, models and videos may be added. At the conclusion of this workshop, patients will be able to understand the types of stress reactions, differentiate between acute and chronic stress, and recognize the impact that chronic stress has on their health. They will also be able to apply different coping mechanisms, such as reframing negative self-talk. Patients will have the opportunity to practice a variety of stress management techniques, such as deep abdominal breathing, progressive muscle relaxation, and/or guided imagery.  The purpose of this lesson is to educate patients on the role of stress in their lives and to provide healthy techniques for coping with it.  Learning Barriers/Preferences:  Learning Barriers/Preferences - 06/03/22 1003       Learning Barriers/Preferences   Learning Barriers Sight    Learning Preferences Audio;Computer/Internet;Pictoral;Video;Verbal Instruction             Education Topics:  Knowledge Questionnaire Score:  Knowledge Questionnaire Score - 06/03/22 1248       Knowledge Questionnaire Score   Pre Score 20/24             Core Components/Risk Factors/Patient Goals at Admission:  Personal Goals and Risk Factors at Admission - 06/03/22 1236       Core Components/Risk Factors/Patient Goals on Admission   Hypertension Yes    Intervention Provide education on lifestyle modifcations including regular physical activity/exercise, weight management, moderate sodium restriction and increased consumption of fresh fruit, vegetables, and low fat dairy, alcohol moderation, and smoking cessation.;Monitor prescription use compliance.     Expected Outcomes Short Term: Continued assessment and intervention until BP is < 140/38mm HG  in hypertensive participants. < 130/24mm HG in hypertensive participants with diabetes, heart failure or chronic kidney disease.;Long Term: Maintenance of blood pressure at goal levels.    Lipids Yes    Intervention Provide education and support for participant on nutrition & aerobic/resistive exercise along with prescribed medications to achieve LDL 70mg , HDL >40mg .    Expected Outcomes Short Term: Participant states understanding of desired cholesterol values and is compliant with medications prescribed. Participant is following exercise prescription and nutrition guidelines.;Long Term: Cholesterol controlled with medications as prescribed, with individualized exercise RX and with personalized nutrition plan. Value goals: LDL < 70mg , HDL > 40 mg.    Stress Yes    Intervention Offer individual and/or small group education and counseling on adjustment to heart disease, stress management and health-related lifestyle change. Teach and support self-help strategies.;Refer participants experiencing significant psychosocial distress to appropriate mental health specialists for further evaluation and treatment. When possible, include family members and significant others in education/counseling sessions.    Expected Outcomes Short Term: Participant demonstrates changes in health-related behavior, relaxation and other stress management skills, ability to obtain effective social support, and compliance with psychotropic medications if prescribed.;Long Term: Emotional wellbeing is indicated by absence of clinically significant psychosocial distress or social isolation.             Core Components/Risk Factors/Patient Goals Review:    Core Components/Risk Factors/Patient Goals at Discharge (Final Review):    ITP Comments:  ITP Comments     Row Name 06/03/22 0810           ITP Comments Armanda Magic, MD:   Medical Director. Introduction to the [facility] education program / Intensive cardiac rehab.  Initial orientation packet reviewed with the patient.                Comments: Participant attended orientation for the cardiac rehabilitation program on  06/03/2022  to perform initial intake and exercise walk test. Patient introduced to the [facility] education and orientation packet was reviewed. Completed 6-minute walk test, measurements, initial ITP, and exercise prescription. Vital signs stable. Telemetry-normal sinus rhythm, asymptomatic.   Service time was from 7:50 to 10:50.

## 2022-06-03 NOTE — Progress Notes (Signed)
Cardiac Rehab Medication Review by a Nurse  Does the patient  feel that his/her medications are working for him/her?  yes  Has the patient been experiencing any side effects to the medications prescribed?  yes  Does the patient measure his/her own blood pressure or blood glucose at home?  yes   Does the patient have any problems obtaining medications due to transportation or finances?   no  Understanding of regimen: fair Understanding of indications: good Potential of compliance: good    Nurse  comments:  Outside medications reconciled. Robert Estes does not know how to check his blood pressures at home, he says he will bring in his home monitor for staff to assist in showing how to use.  Robert Estes says he feels  tired at times and is not sure if its from some of his medications.    Robert Estes [facility] RN 06/03/2022 8:39 AM

## 2022-06-09 ENCOUNTER — Encounter (HOSPITAL_COMMUNITY)
Admission: RE | Admit: 2022-06-09 | Discharge: 2022-06-09 | Disposition: A | Discharge status: Home / Self Care | Payer: Medicare Other | Source: Ambulatory Visit | Attending: Cardiology | Admitting: Cardiology

## 2022-06-09 DIAGNOSIS — Z955 Presence of coronary angioplasty implant and graft: Secondary | ICD-10-CM

## 2022-06-09 DIAGNOSIS — I214 Non-ST elevation (NSTEMI) myocardial infarction: Secondary | ICD-10-CM | POA: Diagnosis not present

## 2022-06-09 DIAGNOSIS — Z952 Presence of prosthetic heart valve: Secondary | ICD-10-CM

## 2022-06-09 NOTE — Progress Notes (Signed)
Daily Session Note  Patient Details  Name: Robert Estes MRN: [id number] Date of Birth: 12/30/1945 Referring Provider:   Flowsheet Row INTENSIVE CARDIAC REHAB ORIENT from 06/03/2022 in [facility]  Referring Provider Dr. Keitha Butte, MD covering)       Encounter Date: 06/09/2022  Check In:  Session Check In - 06/09/22 0916       Check-In   Supervising physician immediately available to respond to emergencies [facility] MD immediately available    Physician(s) Jari Favre PA    Location [facility]    Staff Present Lorin Picket, MS, ACSM-CEP, CCRP, Exercise Physiologist;Tamura Lasky, RN, Cathlean Cower, MS, Exercise Physiologist;Olinty Peggye Pitt, MS, ACSM-CEP, Exercise Physiologist;Sarah Cleophas Dunker, RN, MSN;Jetta Walker BS, ACSM-CEP, Exercise Physiologist;Johnny Hale Bogus, MS, Exercise Physiologist    Virtual Visit No    Medication changes reported     No    Fall or balance concerns reported    No    Tobacco Cessation No Change    Warm-up and Cool-down Performed as group-led instruction    Resistance Training Performed Yes    VAD Patient? No    PAD/SET Patient? No      Pain Assessment   Currently in Pain? No/denies    Pain Score 0-No pain    Multiple Pain Sites No             Capillary Blood Glucose: No results found for this or any previous visit (from the past 24 hour(s)).   Exercise Prescription Changes - 06/09/22 1000       Response to Exercise   Blood Pressure (Admit) 110/72    Blood Pressure (Exercise) 140/72    Blood Pressure (Exit) 108/60    Heart Rate (Admit) 61 bpm    Heart Rate (Exercise) 97 bpm    Heart Rate (Exit) 70 bpm    Rating of Perceived Exertion (Exercise) 11    Symptoms None    Comments Pt's first day in the [facility] program    Duration Continue with 30 min of aerobic exercise without signs/symptoms of physical distress.    Intensity THRR unchanged      Progression    Progression Continue to progress workloads to maintain intensity without signs/symptoms of physical distress.    Average METs 2.25      Resistance Training   Training Prescription Yes    Weight 3 lbs    Reps 10-15    Time 10 Minutes      Interval Training   Interval Training No      NuStep   Level 2    SPM 78    Minutes 15    METs 2.3      Arm Ergometer   Level 1.5    Watts 13    RPM 49    Minutes 15    METs 2.2             Social History   Tobacco Use  Smoking Status Never  Smokeless Tobacco Never    Goals Met:  Exercise tolerated well No report of concerns or symptoms today Strength training completed today  Goals Unmet:  Not Applicable  Comments: Pt started cardiac rehab today.  Pt tolerated light exercise without difficulty. VSS, telemetry-Sinus Rhythm, asymptomatic.  Medication list reconciled. Pt denies barriers to medicaiton compliance.  PSYCHOSOCIAL ASSESSMENT:  PHQ-10. 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 Patient says he is returning to work tomorrow  working 3-4 hours a day. Patient reports he has experienced some insomnia he takes sleeping medication for this.  Robert Estes says he returns to work tomorrow. QUALITY OF LIFE SCORE REVIEW  Pt completed Quality of Life survey as a participant in Cardiac Rehab.  Scores 19.0 .0 or below are considered low.  Pt score very low in several areas Overall 16.97, Health and Function 12.43, socioeconomic 19.75, physiological and spiritual 23.57, family 16.97. Patient quality of life slightly altered by physical constraints which limits ability to perform as prior to recent cardiac illness Robert Estes says he is interested in receiving counseling. Patient's primary care provider Loura Back NP's office called and notified at [facility] will forward QOL to [facility] street health for review/ Offered emotional support and reassurance.  Will continue to monitor and intervene as necessary.    Pt exhibits positive coping skills, hopeful outlook with supportive family.     Pt enjoys fishing.   Pt oriented to exercise equipment and routine.    Understanding verbalized. Robert Headings RN BSN    Dr. Armanda Magic is Medical Director for Cardiac Rehab at [facility].

## 2022-06-11 ENCOUNTER — Encounter (HOSPITAL_COMMUNITY)
Admission: RE | Admit: 2022-06-11 | Discharge: 2022-06-11 | Disposition: A | Discharge status: Home / Self Care | Payer: Medicare Other | Source: Ambulatory Visit | Attending: Cardiology | Admitting: Cardiology

## 2022-06-11 DIAGNOSIS — Z955 Presence of coronary angioplasty implant and graft: Secondary | ICD-10-CM

## 2022-06-11 DIAGNOSIS — I214 Non-ST elevation (NSTEMI) myocardial infarction: Secondary | ICD-10-CM

## 2022-06-11 DIAGNOSIS — Z952 Presence of prosthetic heart valve: Secondary | ICD-10-CM

## 2022-06-13 ENCOUNTER — Encounter (HOSPITAL_COMMUNITY)
Admission: RE | Admit: 2022-06-13 | Discharge: 2022-06-13 | Disposition: A | Discharge status: Home / Self Care | Payer: Medicare Other | Source: Ambulatory Visit | Attending: Cardiology | Admitting: Cardiology

## 2022-06-13 DIAGNOSIS — I214 Non-ST elevation (NSTEMI) myocardial infarction: Secondary | ICD-10-CM | POA: Diagnosis not present

## 2022-06-13 DIAGNOSIS — Z952 Presence of prosthetic heart valve: Secondary | ICD-10-CM

## 2022-06-13 DIAGNOSIS — Z955 Presence of coronary angioplasty implant and graft: Secondary | ICD-10-CM

## 2022-06-16 ENCOUNTER — Encounter (HOSPITAL_COMMUNITY)
Admission: RE | Admit: 2022-06-16 | Discharge: 2022-06-16 | Disposition: A | Discharge status: Home / Self Care | Payer: Medicare Other | Source: Ambulatory Visit | Attending: Cardiology | Admitting: Cardiology

## 2022-06-16 ENCOUNTER — Other Ambulatory Visit: Payer: Self-pay | Admitting: *Deleted

## 2022-06-16 DIAGNOSIS — Z952 Presence of prosthetic heart valve: Secondary | ICD-10-CM

## 2022-06-16 DIAGNOSIS — I214 Non-ST elevation (NSTEMI) myocardial infarction: Secondary | ICD-10-CM | POA: Diagnosis not present

## 2022-06-16 DIAGNOSIS — Z955 Presence of coronary angioplasty implant and graft: Secondary | ICD-10-CM

## 2022-06-16 MED ORDER — METOPROLOL SUCCINATE ER 50 MG PO TB24: 50.0000 mg | ORAL_TABLET | Freq: Every day | ORAL | 0 refills | Status: AC

## 2022-06-16 NOTE — Progress Notes (Signed)
Daily Session Note  Patient Details  Name: Robert Estes MRN: [id number] Date of Birth: 01-31-1945 Referring Provider:   Flowsheet Row INTENSIVE CARDIAC REHAB ORIENT from 06/03/2022 in [facility]  Referring Provider Dr. Keitha Butte, MD covering)       Encounter Date: 06/16/2022  Check In:  Session Check In - 06/16/22 0911       Check-In   Supervising physician immediately available to respond to emergencies [facility] MD immediately available    Physician(s) Bernadene Person NP    Location [facility]    Staff Present Lorin Picket, MS, ACSM-CEP, CCRP, Exercise Physiologist;Jaquayla Hege Cleophas Dunker, RN, MSN;Mary Gerre Scull, RN, Zachery Conch, MS, ACSM-CEP, Exercise Physiologist;Jetta Walker BS, ACSM-CEP, Exercise Physiologist    Virtual Visit No    Medication changes reported     No    Fall or balance concerns reported    No    Tobacco Cessation No Change    Warm-up and Cool-down Performed as group-led instruction    Resistance Training Performed No    VAD Patient? No    PAD/SET Patient? No      Pain Assessment   Currently in Pain? No/denies    Pain Score 0-No pain    Multiple Pain Sites No             Capillary Blood Glucose: No results found for this or any previous visit (from the past 24 hour(s)).    Social History   Tobacco Use  Smoking Status Never  Smokeless Tobacco Never    Goals Met:  Independence with exercise equipment Exercise tolerated well No report of concerns or symptoms today Strength training completed today  Goals Unmet:  Not Applicable  Comments: No issues.   Dr. Armanda Magic is Medical Director for Cardiac Rehab at [facility].

## 2022-06-18 ENCOUNTER — Encounter (HOSPITAL_COMMUNITY)
Admission: RE | Admit: 2022-06-18 | Discharge: 2022-06-18 | Disposition: A | Discharge status: Home / Self Care | Payer: Medicare Other | Source: Ambulatory Visit | Attending: Cardiology | Admitting: Cardiology

## 2022-06-18 DIAGNOSIS — Z952 Presence of prosthetic heart valve: Secondary | ICD-10-CM

## 2022-06-18 DIAGNOSIS — I214 Non-ST elevation (NSTEMI) myocardial infarction: Secondary | ICD-10-CM | POA: Diagnosis not present

## 2022-06-18 DIAGNOSIS — Z955 Presence of coronary angioplasty implant and graft: Secondary | ICD-10-CM

## 2022-06-18 NOTE — Progress Notes (Signed)
Pt complained of mid axillary  left arm discomfort when using the arm for exercise.  Pt reported that he felt this last evening and applied a heating pad which did help.  Feels a pull/tug when he raises his arm up.  Lowered the arms on the equipment.  Opted to have pt stay on nustep for the 30 minutes and will confer with EP for alternatives to the arm crank for second station. Rechecked on pt who reported that it was much improved. >carl

## 2022-06-20 ENCOUNTER — Encounter (HOSPITAL_COMMUNITY)
Admission: RE | Admit: 2022-06-20 | Discharge: 2022-06-20 | Disposition: A | Discharge status: Home / Self Care | Payer: Medicare Other | Source: Ambulatory Visit | Attending: Cardiology | Admitting: Cardiology

## 2022-06-20 DIAGNOSIS — I214 Non-ST elevation (NSTEMI) myocardial infarction: Secondary | ICD-10-CM

## 2022-06-20 DIAGNOSIS — Z952 Presence of prosthetic heart valve: Secondary | ICD-10-CM

## 2022-06-20 DIAGNOSIS — Z955 Presence of coronary angioplasty implant and graft: Secondary | ICD-10-CM

## 2022-06-25 ENCOUNTER — Encounter (HOSPITAL_COMMUNITY)
Admission: RE | Admit: 2022-06-25 | Discharge: 2022-06-25 | Disposition: A | Discharge status: Home / Self Care | Payer: Medicare Other | Source: Ambulatory Visit | Attending: Cardiology | Admitting: Cardiology

## 2022-06-25 DIAGNOSIS — I214 Non-ST elevation (NSTEMI) myocardial infarction: Secondary | ICD-10-CM

## 2022-06-25 DIAGNOSIS — Z955 Presence of coronary angioplasty implant and graft: Secondary | ICD-10-CM

## 2022-06-25 DIAGNOSIS — Z952 Presence of prosthetic heart valve: Secondary | ICD-10-CM

## 2022-06-27 ENCOUNTER — Encounter (HOSPITAL_COMMUNITY)
Admission: RE | Admit: 2022-06-27 | Discharge: 2022-06-27 | Disposition: A | Discharge status: Home / Self Care | Payer: Medicare Other | Source: Ambulatory Visit | Attending: Cardiology | Admitting: Cardiology

## 2022-06-27 DIAGNOSIS — Z955 Presence of coronary angioplasty implant and graft: Secondary | ICD-10-CM

## 2022-06-27 DIAGNOSIS — Z952 Presence of prosthetic heart valve: Secondary | ICD-10-CM

## 2022-06-27 DIAGNOSIS — I214 Non-ST elevation (NSTEMI) myocardial infarction: Secondary | ICD-10-CM

## 2022-06-30 ENCOUNTER — Encounter (HOSPITAL_COMMUNITY)
Admission: RE | Admit: 2022-06-30 | Discharge: 2022-06-30 | Disposition: A | Discharge status: Home / Self Care | Payer: Medicare Other | Source: Ambulatory Visit | Attending: Cardiology | Admitting: Cardiology

## 2022-06-30 DIAGNOSIS — Z952 Presence of prosthetic heart valve: Secondary | ICD-10-CM | POA: Insufficient documentation

## 2022-06-30 DIAGNOSIS — Z955 Presence of coronary angioplasty implant and graft: Secondary | ICD-10-CM | POA: Diagnosis present

## 2022-06-30 DIAGNOSIS — I214 Non-ST elevation (NSTEMI) myocardial infarction: Secondary | ICD-10-CM | POA: Diagnosis present

## 2022-07-01 NOTE — Progress Notes (Signed)
Cardiac Individual Treatment Plan  Patient Details  Name: Robert Estes MRN: [id number] Date of Birth: Oct 31, 1945 Referring Provider:   Flowsheet Row INTENSIVE CARDIAC REHAB ORIENT from 06/03/2022 in [facility]  Referring Provider Dr. Keitha Butte, MD covering)       Initial Encounter Date:  Flowsheet Row INTENSIVE CARDIAC REHAB ORIENT from 06/03/2022 in [facility]  Date 06/03/22       Visit Diagnosis: 04/16/22 S/P TAVR at  [facility]  05/10/22 S/P DES x 3 at [facility]  05/10/22 NSTEMI at [facility]  Patient's Home Medications on Admission:  Current Outpatient Medications:    acetaminophen (TYLENOL) 500 MG tablet, Take 500 mg by mouth every 6 (six) hours as needed for moderate pain., Disp: , Rfl:    amoxicillin (AMOXIL) 500 MG capsule, Take 500 mg by mouth as directed. Take 4 capsules by mouth as needed for dental procedures, Disp: , Rfl:    aspirin 81 MG tablet, Take 81 mg by mouth daily., Disp: , Rfl:    clopidogrel (PLAVIX) 75 MG tablet, Take 75 mg by mouth daily., Disp: , Rfl:    eplerenone (INSPRA) 25 MG tablet, Take 25 mg by mouth daily., Disp: , Rfl:    Evolocumab (REPATHA SURECLICK) 140 MG/ML SOAJ, INJECT 1 PEN INTO THE SKIN EVERY 14 DAYS, Disp: 2 mL, Rfl: 11   latanoprost (XALATAN) 0.005 % ophthalmic solution, Place 1 drop into both eyes at bedtime., Disp: , Rfl:    levocetirizine (XYZAL) 5 MG tablet, Take 5 mg by mouth daily., Disp: , Rfl:    lisinopril (ZESTRIL) 10 MG tablet, Take 1 tablet by mouth daily., Disp: , Rfl:    metoprolol succinate (TOPROL-XL) 50 MG 24 hr tablet, Take 1 tablet (50 mg total) by mouth daily., Disp: 30 tablet, Rfl: 0   montelukast (SINGULAIR) 10 MG tablet, Take 10 mg by mouth at bedtime., Disp: , Rfl:    Multiple Vitamin (MULTIVITAMIN) tablet, Take 1 tablet by mouth daily. (Patient not taking: Reported on  06/03/2022), Disp: , Rfl:    nitroGLYCERIN (NITROSTAT) 0.4 MG SL tablet, Place 1 tablet (0.4 mg total) under the tongue every 5 (five) minutes as needed for chest pain., Disp: 25 tablet, Rfl: 11   polyethylene glycol (MIRALAX / GLYCOLAX) 17 g packet, Take 17 g by mouth daily as needed for mild constipation., Disp: , Rfl:    rosuvastatin (CRESTOR) 40 MG tablet, Take 1 tablet by mouth at bedtime., Disp: , Rfl:    sodium chloride (OCEAN) 0.65 % SOLN nasal spray, Place 1 spray into both nostrils as needed for congestion., Disp: , Rfl:    tamsulosin (FLOMAX) 0.4 MG CAPS capsule, Take 0.4 mg by mouth daily., Disp: , Rfl:    testosterone cypionate (DEPOTESTOSTERONE CYPIONATE) 200 MG/ML injection, Inject 200 mg into the muscle every 14 (fourteen) days., Disp: , Rfl:    TURMERIC PO, Take 2 capsules by mouth daily., Disp: , Rfl:    zolpidem (AMBIEN) 10 MG tablet, TAKE 1 TABLET(10 MG) BY MOUTH AT BEDTIME AS NEEDED FOR SLEEP, Disp: 30 tablet, Rfl: 0  Past Medical History: Past Medical History:  Diagnosis Date   Arthritis    "hands, knees, elbows, fingers" (01/25/2013)   Coronary artery disease    HX OF HEART STENTS X 2   2010--DR. H. SMITH IS PT'S CARDIOLOGIST, MILD AORTIC STENOSIS   Dizziness    Enlarged prostate  FLOMAX HELPS PT VOID   Exertional shortness of breath    GERD (gastroesophageal reflux disease)    OTC PREVACID AS NEEDED   Glaucoma    BOTH EYES   Gout    Hyperlipidemia    Hypertension    Hypertension    Iron deficiency anemia    hx   Migraine    "monthly" (01/25/2013)   Peripheral vascular disease (HCC)    CLAUDICATION    Tobacco Use: Social History   Tobacco Use  Smoking Status Never  Smokeless Tobacco Never    Labs: Review Flowsheet  More data exists      Latest Ref Rng & Units 09/22/2018 11/22/2018 08/22/2019 02/20/2020 11/13/2021  Labs for ITP Cardiac and Pulmonary Rehab  Cholestrol 100 - 199 mg/dL 604  540  981  191  -  LDL (calc) 0 - 99 mg/dL 71  79  45  88  -   HDL-C >39 mg/dL 33  36  38  34  -  Trlycerides 0 - 149 mg/dL 478  295  621  308  -  PH, Arterial 7.35 - 7.45 - - - - 7.376   PCO2 arterial 32 - 48 mmHg - - - - 37.8   Bicarbonate 20.0 - 28.0 mmol/L - - - - 22.2  24.6  22.9   TCO2 22 - 32 mmol/L - - - - 23  26  24    Acid-base deficit 0.0 - 2.0 mmol/L - - - - 3.0  1.0  2.0   O2 Saturation % - - - - 95  77  79     Capillary Blood Glucose: Lab Results  Component Value Date   GLUCAP 84 05/10/2022   GLUCAP 137 (H) 09/03/2017     Exercise Target Goals: Exercise Program Goal: Individual exercise prescription set using results from initial 6 min walk test and THRR while considering  patient's activity barriers and safety.   Exercise Prescription Goal: Initial exercise prescription builds to 30-45 minutes a day of aerobic activity, 2-3 days per week.  Home exercise guidelines will be given to patient during program as part of exercise prescription that the participant will acknowledge.  Activity Barriers & Risk Stratification:  Activity Barriers & Cardiac Risk Stratification - 06/03/22 0813       Activity Barriers & Cardiac Risk Stratification   Activity Barriers Back Problems;Arthritis;Neck/Spine Problems;Joint Problems;Deconditioning;Muscular Weakness;Chest Pain/Angina;Balance Concerns    Cardiac Risk Stratification High             6 Minute Walk:  6 Minute Walk     Row Name 06/03/22 0919         6 Minute Walk   Phase Initial     Distance 1583 feet     Walk Time 6 minutes     # of Rest Breaks 0     MPH 3     METS 3.5     RPE 7     Perceived Dyspnea  0     VO2 Peak 12.1     Symptoms Yes (comment)     Comments Left great toe pain 2/10 (gout)     Resting HR 73 bpm     Resting BP 110/60     Resting Oxygen Saturation  98 %     Exercise Oxygen Saturation  during 6 min walk 100 %     Max Ex. HR 112 bpm     Max Ex. BP 130/72     2 Minute Post BP 122/70  Oxygen Initial Assessment:   Oxygen  Re-Evaluation:   Oxygen Discharge (Final Oxygen Re-Evaluation):   Initial Exercise Prescription:  Initial Exercise Prescription - 06/03/22 0900       Date of Initial Exercise RX and Referring Provider   Date 06/03/22    Referring Provider Dr. Keitha Butte, MD covering)    Expected Discharge Date 08/15/22      NuStep   Level 2    SPM 80    Minutes 15    METs 3.5      Arm Ergometer   Level 1.5    Watts 25    RPM 60    Minutes 15    METs 3.5      Prescription Details   Frequency (times per week) 3    Duration Progress to 30 minutes of continuous aerobic without signs/symptoms of physical distress      Intensity   THRR 40-80% of Max Heartrate 58-117    Ratings of Perceived Exertion 11-13    Perceived Dyspnea 0-4      Progression   Progression Continue progressive overload as per policy without signs/symptoms or physical distress.      Resistance Training   Training Prescription Yes    Weight 3 lbs    Reps 10-15             Perform Capillary Blood Glucose checks as needed.  Exercise Prescription Changes:   Exercise Prescription Changes     Row Name 06/09/22 1000 06/25/22 1400           Response to Exercise   Blood Pressure (Admit) 110/72 108/66      Blood Pressure (Exercise) 140/72 122/60      Blood Pressure (Exit) 108/60 118/56      Heart Rate (Admit) 61 bpm 67 bpm      Heart Rate (Exercise) 97 bpm 81 bpm      Heart Rate (Exit) 70 bpm 66 bpm      Rating of Perceived Exertion (Exercise) 11 12      Symptoms None None      Comments Pt's first day in the CRP2 program Reviewed METs      Duration Continue with 30 min of aerobic exercise without signs/symptoms of physical distress. Continue with 30 min of aerobic exercise without signs/symptoms of physical distress.      Intensity THRR unchanged THRR unchanged        Progression   Progression Continue to progress workloads to maintain intensity without signs/symptoms of physical distress.  Continue to progress workloads to maintain intensity without signs/symptoms of physical distress.      Average METs 2.25 2.6        Resistance Training   Training Prescription Yes No      Weight 3 lbs No wts on Wednesddays      Reps 10-15 --      Time 10 Minutes --        Interval Training   Interval Training No No        NuStep   Level 2 2      SPM 78 84      Minutes 15 30      METs 2.3 2.6        Arm Ergometer   Level 1.5 1.5      Watts 13 --      RPM 49 --      Minutes 15 --      METs 2.2 --  Exercise Comments:   Exercise Comments     Row Name 06/09/22 1053 06/25/22 1449         Exercise Comments Pt's first day in the CRP2 program. Pt exercised with no complaints and is off to a good start. Reviewed METs. Pt is making progressin the program. 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               Exercise Goals and Review:   Exercise Goals     Row Name 06/03/22 1610             Exercise Goals   Increase Physical Activity Yes       Intervention Provide advice, education, support and counseling about physical activity/exercise needs.;Develop an individualized exercise prescription for aerobic and resistive training based on initial evaluation findings, risk stratification, comorbidities and participant's personal goals.       Expected Outcomes Short Term: Attend rehab on a regular basis to increase amount of physical activity.;Long Term: Add in home exercise to make exercise part of routine and to increase amount of physical activity.;Long Term: Exercising regularly at least 3-5 days a week.       Increase Strength and Stamina Yes       Intervention Provide advice, education, support and counseling about physical activity/exercise needs.;Develop an individualized exercise prescription for aerobic and resistive training based on initial evaluation findings, risk stratification, comorbidities and participant's personal  goals.       Expected Outcomes Short Term: Increase workloads from initial exercise prescription for resistance, speed, and METs.;Short Term: Perform resistance training exercises routinely during rehab and add in resistance training at home;Long Term: Improve cardiorespiratory fitness, muscular endurance and strength as measured by increased METs and functional capacity ( )       Able to understand and use rate of perceived exertion (RPE) scale Yes       Intervention Provide education and explanation on how to use RPE scale       Expected Outcomes Short Term: Able to use RPE daily in rehab to express subjective intensity level;Long Term:  Able to use RPE to guide intensity level when exercising independently       Knowledge and understanding of Target Heart Rate Range (THRR) Yes       Intervention Provide education and explanation of THRR including how the numbers were predicted and where they are located for reference       Expected Outcomes Short Term: Able to state/look up THRR;Short Term: Able to use daily as guideline for intensity in rehab;Long Term: Able to use THRR to govern intensity when exercising independently       Understanding of Exercise Prescription Yes       Intervention Provide education, explanation, and written materials on patient's individual exercise prescription       Expected Outcomes Short Term: Able to explain program exercise prescription;Long Term: Able to explain home exercise prescription to exercise independently                Exercise Goals Re-Evaluation :  Exercise Goals Re-Evaluation     Row Name 06/09/22 1051             Exercise Goal Re-Evaluation   Exercise Goals Review Increase Physical Activity;Increase Strength and Stamina;Able to understand and use rate of perceived exertion (RPE) scale;Knowledge and understanding of Target Heart Rate Range (THRR);Understanding of Exercise Prescription       Comments Pt's first day  in the CRP2 program. Pt  understands the Exercise Rx, RPE scale, and THRR.       Expected Outcomes Will continue to monitor the patient and progress exercise workloads as tolerated.                Discharge Exercise Prescription (Final Exercise Prescription Changes):  Exercise Prescription Changes - 06/25/22 1400       Response to Exercise   Blood Pressure (Admit) 108/66    Blood Pressure (Exercise) 122/60    Blood Pressure (Exit) 118/56    Heart Rate (Admit) 67 bpm    Heart Rate (Exercise) 81 bpm    Heart Rate (Exit) 66 bpm    Rating of Perceived Exertion (Exercise) 12    Symptoms None    Comments Reviewed METs    Duration Continue with 30 min of aerobic exercise without signs/symptoms of physical distress.    Intensity THRR unchanged      Progression   Progression Continue to progress workloads to maintain intensity without signs/symptoms of physical distress.    Average METs 2.6      Resistance Training   Training Prescription No    Weight No wts on Wednesddays      Interval Training   Interval Training No      NuStep   Level 2    SPM 84    Minutes 30    METs 2.6      Arm Ergometer   Level 1.5             Nutrition:  Target Goals: Understanding of nutrition guidelines, daily intake of sodium 1500mg , cholesterol 200mg , calories 30% from fat and 7% or less from saturated fats, daily to have 5 or more servings of fruits and vegetables.  Biometrics:  Pre Biometrics - 06/03/22 0845       Pre Biometrics   Waist Circumference 36 inches    Hip Circumference 39.5 inches    Waist to Hip Ratio 0.91 %    Triceps Skinfold 12 mm    % Body Fat 20.6 %    Grip Strength 42 kg    Flexibility 14.5 in    Single Leg Stand 6.78 seconds              Nutrition Therapy Plan and Nutrition Goals:  Nutrition Therapy & Goals - 06/09/22 1016       Nutrition Therapy   Diet Heart Healthy Diet      Personal Nutrition Goals   Nutrition Goal Patient to identify strategies for reducing  cardiovascular risk by attending the [employer] education and nutrition series weekly.    Personal Goal #2 Patient to improve diet quality by using the plate method as a guide for meal planning to include lean protein/plant protein, fruits, vegetables, whole grains, nonfat dairy as part of a well-balanced diet.    Personal Goal #3 Patient to reduce sodium intake to 1500mg  per day    Comments Robert Estes will benefit form participation in intensive cardiac rehab for nutrition, exercise, and lifestyle modification.      Intervention Plan   Intervention Nutrition handout(s) given to patient.;Prescribe, educate and counsel regarding individualized specific dietary modifications aiming towards targeted core components such as weight, hypertension, lipid management, diabetes, heart failure and other comorbidities.    Expected Outcomes Short Term Goal: Understand basic principles of dietary content, such as calories, fat, sodium, cholesterol and nutrients.;Long Term Goal: Adherence to prescribed nutrition plan.  Nutrition Assessments:  Nutrition Assessments - 06/09/22 1014       Rate Your Plate Scores   Pre Score 69            MEDIFICTS Score Key: ?70 Need to make dietary changes  40-70 Heart Healthy Diet ? 40 Therapeutic Level Cholesterol Diet   Flowsheet Row INTENSIVE CARDIAC REHAB from 06/09/2022 in [facility]  Picture Your Plate Total Score on Admission 69      Picture Your Plate Scores: <56 Unhealthy dietary pattern with much room for improvement. 41-50 Dietary pattern unlikely to meet recommendations for good health and room for improvement. 51-60 More healthful dietary pattern, with some room for improvement.  >60 Healthy dietary pattern, although there may be some specific behaviors that could be improved.    Nutrition Goals Re-Evaluation:  Nutrition Goals Re-Evaluation     Row Name 06/09/22 1016              Goals   Current Weight 164 lb 14.5 oz (74.8 kg)       Comment GFR 39, Cr 1.8, lipids WNL, A1c WNL       Expected Outcome Robert Estes will benefit form participation in intensive cardiac rehab for nutrition, exercise, and lifestyle modification.                Nutrition Goals Re-Evaluation:  Nutrition Goals Re-Evaluation     Row Name 06/09/22 1016             Goals   Current Weight 164 lb 14.5 oz (74.8 kg)       Comment GFR 39, Cr 1.8, lipids WNL, A1c WNL       Expected Outcome Robert Estes will benefit form participation in intensive cardiac rehab for nutrition, exercise, and lifestyle modification.                Nutrition Goals Discharge (Final Nutrition Goals Re-Evaluation):  Nutrition Goals Re-Evaluation - 06/09/22 1016       Goals   Current Weight 164 lb 14.5 oz (74.8 kg)    Comment GFR 39, Cr 1.8, lipids WNL, A1c WNL    Expected Outcome Robert Estes will benefit form participation in intensive cardiac rehab for nutrition, exercise, and lifestyle modification.             Psychosocial: Target Goals: Acknowledge presence or absence of significant depression and/or stress, maximize coping skills, provide positive support system. Participant is able to verbalize types and ability to use techniques and skills needed for reducing stress and depression.  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 Has not returned to work yet. Pt declines counseling at this time, but voices he knows he can if needed.      Family Dynamics   Good Support System? Yes    Comments Wife and son are pt's support      Barriers   Psychosocial barriers to participate in program The patient should benefit from training in stress management and relaxation.      Screening  Interventions   Interventions Encouraged to exercise    Expected Outcomes Short Term goal: Identification and review with participant of any Quality of Life or Depression concerns found by scoring the  questionnaire.;Long Term goal: The participant improves quality of Life and PHQ9 Scores as seen by post scores and/or verbalization of changes;Long Term Goal: Stressors or current issues are controlled or eliminated.             Quality of Life Scores:  Quality of Life - 06/03/22 1248       Quality of Life   Select Quality of Life      Quality of Life Scores   Health/Function Pre 12.43 %    Socioeconomic Pre 19.75 %    Psych/Spiritual Pre 23.57 %    Family Pre 16.9 %    GLOBAL Pre 16.97 %            Scores of 19 and below usually indicate a poorer quality of life in these areas.  A difference of  2-3 points is a clinically meaningful difference.  A difference of 2-3 points in the total score of the Quality of Life Index has been associated with significant improvement in overall quality of life, self-image, physical symptoms, and general health in studies assessing change in quality of life.  PHQ-9: Review Flowsheet  More data exists      06/03/2022 02/16/2020 08/22/2019 04/04/2019 03/08/2019  Depression screen PHQ 2/9  Decreased Interest 1 0 0 0 0  Down, Depressed, Hopeless 2 0 0 0 0  PHQ - 2 Score 3 0 0 0 0  Altered sleeping 1 - - - -  Tired, decreased energy 1 - - - -  Change in appetite 0 - - - -  Feeling bad or failure about yourself  2 - - - -  Trouble concentrating 1 - - - -  Moving slowly or fidgety/restless 2 - - - -  Suicidal thoughts 0 - - - -  PHQ-9 Score 10 - - - -  Difficult doing work/chores Not difficult at all - - - -   Interpretation of Total Score  Total Score Depression Severity:  1-4 = Minimal depression, 5-9 = Mild depression, 10-14 = Moderate depression, 15-19 = Moderately severe depression, 20-27 = Severe depression   Psychosocial Evaluation and  Intervention:   Psychosocial Re-Evaluation:  Psychosocial Re-Evaluation     Row Name 06/09/22 1327 07/01/22 1032           Psychosocial Re-Evaluation   Current issues with Current Stress Concerns;Current Depression Current Stress Concerns;Current Depression      Comments Reviewed quality of life questionnaire. Robert Estes admits to being depressed he feels his heart attack happened becuase he did not follow the doctor's reccomendation and went back to work too soon. Robert Estes is returning back to work tomorrow with reduced hours. Patient's PCP notified. Robert Estes syas he is interesed in counselling Robert Estes has not voiced nay concerns or stressors during exercise with ICR.      Expected Outcomes Robert Estes will have decreased depression, stress concerns upon completion of intensive cardiac rehab. Robert Estes will have decreased depression, stress concerns upon completion of intensive cardiac rehab.      Interventions Physician referral;Encouraged to attend Cardiac Rehabilitation for the exercise;Stress management education Physician referral;Encouraged to attend Cardiac Rehabilitation for the exercise;Stress management education      Continue Psychosocial Services  Follow up required by staff No Follow up required        Initial Review   Source of Stress Concerns Chronic Illness;Occupation Chronic Illness;Occupation      Comments Will continue to monitor and offer support as needed Will continue to monitor and offer support as needed  Psychosocial Discharge (Final Psychosocial Re-Evaluation):  Psychosocial Re-Evaluation - 07/01/22 1032       Psychosocial Re-Evaluation   Current issues with Current Stress Concerns;Current Depression    Comments Robert Estes has not voiced nay concerns or stressors during exercise with ICR.    Expected Outcomes Robert Estes will have decreased depression, stress concerns upon completion of intensive cardiac rehab.    Interventions Physician referral;Encouraged to attend  Cardiac Rehabilitation for the exercise;Stress management education    Continue Psychosocial Services  No Follow up required      Initial Review   Source of Stress Concerns Chronic Illness;Occupation    Comments Will continue to monitor and offer support as needed             Vocational Rehabilitation: Provide vocational rehab assistance to qualifying candidates.   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            Education: Education Goals: Education classes will be provided on a weekly basis, covering required topics. Participant will state understanding/return demonstration of topics presented.    Education     Row Name 06/09/22 0900     Education   Cardiac Education Topics [employer]   Glass blower/designer Nutrition   Instruction Review Code 1- Verbalizes Understanding   Class Start Time 0815   Class Stop Time 0858   Class Time Calculation (min) 43 min    Row Name 06/11/22 0900     Education   Cardiac Education Topics [employer]   Secondary school teacher School   Educator Dietitian   Weekly Topic Simple Sides and Sauces   Instruction Review Code 1- Verbalizes Understanding   Class Start Time 0815   Class Stop Time 0850   Class Time Calculation (min) 35 min    Row Name 06/13/22 0900     Education   Cardiac Education Topics [employer]in   Select Core Videos     Core Videos   Educator Exercise Physiologist   Select Psychosocial   Psychosocial How Our Thoughts Can Heal Our Hearts   Instruction Review Code 1- Verbalizes Understanding   Class Start Time 0815   Class Stop Time 0855   Class Time Calculation (min) 40 min    Row Name 06/16/22 1000     Education   Cardiac Education Topics  [employer]in   Geographical information systems officer Psychosocial   Psychosocial Workshop From Head to Heart: The Power of a Healthy Outlook   Instruction Review Code 1- Verbalizes Understanding   Class Start Time 0815   Class Stop Time 0903   Class Time Calculation (min) 48 min    Row Name 06/18/22 0900     Education   Cardiac Education Topics Pritikin   Secondary school teacher School   Educator Dietitian   Weekly Topic Powerhouse Plant-Based Proteins   Instruction Review Code 1- Verbalizes Understanding   Class Start Time [phone]   Class Stop Time 0845   Class Time Calculation (min) 35 min    Row Name 06/20/22 0800     Education   Cardiac Education Topics [employer]ikin  Select Core Videos     Core Videos   Educator Exercise Physiologist   Select General Education   General Education Hypertension and Heart Disease   Instruction Review Code 1- Verbalizes Understanding   Class Start Time [phone]   Class Stop Time 0843   Class Time Calculation (min) 31 min    Row Name 06/25/22 1100     Education   Cardiac Education Topics [employer]ritikin   Secondary school teacher School   Educator Dietitian   Weekly Topic Adding Flavor - Sodium-Free   Instruction Review Code 1- Verbalizes Understanding   Class Start Time [phone]   Class Stop Time 0845   Class Time Calculation (min) 33 min    Row Name 06/27/22 [address]     Education   Cardiac Education Topics Pritikin   Psychologist, forensic General Education   General Education Heart Disease Risk Reduction   Instruction Review Code 1- Verbalizes Understanding   Class Start Time 0810   Class Stop Time 0849   Class Time Calculation (min) 39 min    Row Name 06/30/22 0900     Education   Cardiac Education Topics Pritikin   Select Workshops     Workshops   Educator Exercise Physiologist   Select Exercise   Exercise Workshop Environmental consultant and Fall Prevention   Instruction Review Code 1- Verbalizes Understanding   Class Start Time [phone]   Class Stop Time 0855   Class Time Calculation (min) 36 min            Core Videos: Exercise    Move It!  Clinical staff conducted group or individual video education with verbal and written material and guidebook.  Patient learns the recommended Pritikin exercise program. Exercise with the goal of living a long, healthy life. Some of the health benefits of exercise include controlled diabetes, healthier blood pressure levels, improved cholesterol levels, improved heart and lung capacity, improved sleep, and better body composition. Everyone should speak with their doctor before starting or changing an exercise routine.  Biomechanical Limitations Clinical staff conducted group or individual video education with verbal and written material and guidebook.  Patient learns how biomechanical limitations can impact exercise and how we can mitigate and possibly overcome limitations to have an impactful and balanced exercise routine.  Body Composition Clinical staff conducted group or individual video education with verbal and written material and guidebook.  Patient learns that body composition (ratio of muscle mass to fat mass) is a key component to assessing overall fitness, rather than body weight alone. Increased fat mass, especially visceral belly fat, can put [place] at increased risk for metabolic syndrome, type 2 diabetes, heart disease, and even death. It is recommended to combine diet and exercise (cardiovascular and resistance training) to improve your body composition. Seek guidance from your physician and exercise physiologist before implementing an exercise routine.  Exercise Action Plan Clinical staff conducted group or individual video education with verbal and written material and guidebook.  Patient learns the recommended strategies to achieve and enjoy long-term exercise  adherence, including variety, self-motivation, self-efficacy, and positive decision making. Benefits of exercise include fitness, good health, weight management, more energy, better sleep, less stress, and overall well-being.  Medical   Heart Disease Risk Reduction Clinical staff conducted group or individual video education with verbal and written material and guidebook.  Patient learns our heart is our most vital  organ as it circulates oxygen, nutrients, white blood cells, and hormones throughout the entire body, and carries waste away. Data supports a plant-based eating plan like the Pritikin Program for its effectiveness in slowing progression of and reversing heart disease. The video provides a number of recommendations to address heart disease.   Metabolic Syndrome and Belly Fat  Clinical staff conducted group or individual video education with verbal and written material and guidebook.  Patient learns what metabolic syndrome is, how it leads to heart disease, and how one can reverse it and keep it from coming back. You have metabolic syndrome if you have 3 of the following 5 criteria: abdominal obesity, high blood pressure, high triglycerides, low HDL cholesterol, and high blood sugar.  Hypertension and Heart Disease Clinical staff conducted group or individual video education with verbal and written material and guidebook.  Patient learns that high blood pressure, or hypertension, is very common in the [place]. Hypertension is largely due to excessive salt intake, but other important risk factors include being overweight, physical inactivity, drinking too much alcohol, smoking, and not eating enough potassium from fruits and vegetables. High blood pressure is a leading risk factor for heart attack, stroke, congestive heart failure, dementia, kidney failure, and premature death. Long-term effects of excessive salt intake include stiffening of the arteries and thickening of heart muscle and  organ damage. Recommendations include ways to reduce hypertension and the risk of heart disease.  Diseases of Our Time - Focusing on Diabetes Clinical staff conducted group or individual video education with verbal and written material and guidebook.  Patient learns why the best way to stop diseases of our time is prevention, through food and other lifestyle changes. Medicine (such as prescription pills and surgeries) is often only a Band-Aid on the problem, not a long-term solution. Most common diseases of our time include obesity, type 2 diabetes, hypertension, heart disease, and cancer. The Pritikin Program is recommended and has been proven to help reduce, reverse, and/or prevent the damaging effects of metabolic syndrome.  Nutrition   Overview of the Pritikin Eating Plan  Clinical staff conducted group or individual video education with verbal and written material and guidebook.  Patient learns about the Pritikin Eating Plan for disease risk reduction. The Pritikin Eating Plan emphasizes a wide variety of unrefined, minimally-processed carbohydrates, like fruits, vegetables, whole grains, and legumes. Go, Caution, and Stop food choices are explained. Plant-based and lean animal proteins are emphasized. Rationale provided for low sodium intake for blood pressure control, low added sugars for blood sugar stabilization, and low added fats and oils for coronary artery disease risk reduction and weight management.  Calorie Density  Clinical staff conducted group or individual video education with verbal and written material and guidebook.  Patient learns about calorie density and how it impacts the Pritikin Eating Plan. Knowing the characteristics of the food you choose will help you decide whether those foods will lead to weight gain or weight loss, and whether you want to consume more or less of them. Weight loss is usually a side effect of the Pritikin Eating Plan because of its focus on low  calorie-dense foods.  Label Reading  Clinical staff conducted group or individual video education with verbal and written material and guidebook.  Patient learns about the Pritikin recommended label reading guidelines and corresponding recommendations regarding calorie density, added sugars, sodium content, and whole grains.  Dining Out - Part 1  Clinical staff conducted group or individual video education with verbal and  written material and guidebook.  Patient learns that restaurant meals can be sabotaging because they can be so high in calories, fat, sodium, and/or sugar. Patient learns recommended strategies on how to positively address this and avoid unhealthy pitfalls.  Facts on Fats  Clinical staff conducted group or individual video education with verbal and written material and guidebook.  Patient learns that lifestyle modifications can be just as effective, if not more so, as many medications for lowering your risk of heart disease. A Pritikin lifestyle can help to reduce your risk of inflammation and atherosclerosis (cholesterol build-up, or plaque, in the artery walls). Lifestyle interventions such as dietary choices and physical activity address the cause of atherosclerosis. A review of the types of fats and their impact on blood cholesterol levels, along with dietary recommendations to reduce fat intake is also included.  Nutrition Action Plan  Clinical staff conducted group or individual video education with verbal and written material and guidebook.  Patient learns how to incorporate Pritikin recommendations into their lifestyle. Recommendations include planning and keeping personal health goals in mind as an important part of their success.  Healthy Mind-Set    Healthy Minds, Bodies, Hearts  Clinical staff conducted group or individual video education with verbal and written material and guidebook.  Patient learns how to identify when they are stressed. Video will discuss the  impact of that stress, as well as the many benefits of stress management. Patient will also be introduced to stress management techniques. The way we think, act, and feel has an impact on our hearts.  How Our Thoughts Can Heal Our Hearts  Clinical staff conducted group or individual video education with verbal and written material and guidebook.  Patient learns that negative thoughts can cause depression and anxiety. This can result in negative lifestyle behavior and serious health problems. Cognitive behavioral therapy is an effective method to help control our thoughts in order to change and improve our emotional outlook.  Additional Videos:  Exercise    Improving Performance  Clinical staff conducted group or individual video education with verbal and written material and guidebook.  Patient learns to use a non-linear approach by alternating intensity levels and lengths of time spent exercising to help burn more calories and lose more body fat. Cardiovascular exercise helps improve heart health, metabolism, hormonal balance, blood sugar control, and recovery from fatigue. Resistance training improves strength, endurance, balance, coordination, reaction time, metabolism, and muscle mass. Flexibility exercise improves circulation, posture, and balance. Seek guidance from your physician and exercise physiologist before implementing an exercise routine and learn your capabilities and proper form for all exercise.  Introduction to Yoga  Clinical staff conducted group or individual video education with verbal and written material and guidebook.  Patient learns about yoga, a discipline of the coming together of mind, breath, and body. The benefits of yoga include improved flexibility, improved range of motion, better posture and core strength, increased lung function, weight loss, and positive self-image. Yoga's heart health benefits include lowered blood pressure, healthier heart rate, decreased  cholesterol and triglyceride levels, improved immune function, and reduced stress. Seek guidance from your physician and exercise physiologist before implementing an exercise routine and learn your capabilities and proper form for all exercise.  Medical   Aging: Enhancing Your Quality of Life  Clinical staff conducted group or individual video education with verbal and written material and guidebook.  Patient learns key strategies and recommendations to stay in good physical health and enhance quality of life, such as  prevention strategies, having an advocate, securing a Health Care Proxy and Power of Attorney, and keeping a list of medications and system for tracking them. It also discusses how to avoid risk for bone loss.  Biology of Weight Control  Clinical staff conducted group or individual video education with verbal and written material and guidebook.  Patient learns that weight gain occurs because we consume more calories than we burn (eating more, moving less). Even if your body weight is normal, you may have higher ratios of fat compared to muscle mass. Too much body fat puts you at increased risk for cardiovascular disease, heart attack, stroke, type 2 diabetes, and obesity-related cancers. In addition to exercise, following the Pritikin Eating Plan can help reduce your risk.  Decoding Lab Results  Clinical staff conducted group or individual video education with verbal and written material and guidebook.  Patient learns that lab test reflects one measurement whose values change over time and are influenced by many factors, including medication, stress, sleep, exercise, food, hydration, pre-existing medical conditions, and more. It is recommended to use the knowledge from this video to become more involved with your lab results and evaluate your numbers to speak with your doctor.   Diseases of Our Time - Overview  Clinical staff conducted group or individual video education with verbal  and written material and guidebook.  Patient learns that according to the CDC, 50% to 70% of chronic diseases (such as obesity, type 2 diabetes, elevated lipids, hypertension, and heart disease) are avoidable through lifestyle improvements including healthier food choices, listening to satiety cues, and increased physical activity.  Sleep Disorders Clinical staff conducted group or individual video education with verbal and written material and guidebook.  Patient learns how good quality and duration of sleep are important to overall health and well-being. Patient also learns about sleep disorders and how they impact health along with recommendations to address them, including discussing with a physician.  Nutrition  Dining Out - Part 2 Clinical staff conducted group or individual video education with verbal and written material and guidebook.  Patient learns how to plan ahead and communicate in order to maximize their dining experience in a healthy and nutritious manner. Included are recommended food choices based on the type of restaurant the patient is visiting.   Fueling a Banker conducted group or individual video education with verbal and written material and guidebook.  There is a strong connection between our food choices and our health. Diseases like obesity and type 2 diabetes are very prevalent and are in large-part due to lifestyle choices. The Pritikin Eating Plan provides plenty of food and hunger-curbing satisfaction. It is easy to follow, affordable, and helps reduce health risks.  Menu Workshop  Clinical staff conducted group or individual video education with verbal and written material and guidebook.  Patient learns that restaurant meals can sabotage health goals because they are often packed with calories, fat, sodium, and sugar. Recommendations include strategies to plan ahead and to communicate with the manager, chef, or server to help order a healthier  meal.  Planning Your Eating Strategy  Clinical staff conducted group or individual video education with verbal and written material and guidebook.  Patient learns about the Pritikin Eating Plan and its benefit of reducing the risk of disease. The Pritikin Eating Plan does not focus on calories. Instead, it emphasizes high-quality, nutrient-rich foods. By knowing the characteristics of the foods, we choose, we can determine their calorie density and make informed  decisions.  Targeting Your Nutrition Priorities  Clinical staff conducted group or individual video education with verbal and written material and guidebook.  Patient learns that lifestyle habits have a tremendous impact on disease risk and progression. This video provides eating and physical activity recommendations based on your personal health goals, such as reducing LDL cholesterol, losing weight, preventing or controlling type 2 diabetes, and reducing high blood pressure.  Vitamins and Minerals  Clinical staff conducted group or individual video education with verbal and written material and guidebook.  Patient learns different ways to obtain key vitamins and minerals, including through a recommended healthy diet. It is important to discuss all supplements you take with your doctor.   Healthy Mind-Set    Smoking Cessation  Clinical staff conducted group or individual video education with verbal and written material and guidebook.  Patient learns that cigarette smoking and tobacco addiction pose a serious health risk which affects millions of people. Stopping smoking will significantly reduce the risk of heart disease, lung disease, and many forms of cancer. Recommended strategies for quitting are covered, including working with your doctor to develop a successful plan.  Culinary   Becoming a Set designer conducted group or individual video education with verbal and written material and guidebook.  Patient learns  that cooking at home can be healthy, cost-effective, quick, and puts them in control. Keys to cooking healthy recipes will include looking at your recipe, assessing your equipment needs, planning ahead, making it simple, choosing cost-effective seasonal ingredients, and limiting the use of added fats, salts, and sugars.  Cooking - Breakfast and Snacks  Clinical staff conducted group or individual video education with verbal and written material and guidebook.  Patient learns how important breakfast is to satiety and nutrition through the entire day. Recommendations include key foods to eat during breakfast to help stabilize blood sugar levels and to prevent overeating at meals later in the day. Planning ahead is also a key component.  Cooking - Educational psychologist conducted group or individual video education with verbal and written material and guidebook.  Patient learns eating strategies to improve overall health, including an approach to cook more at home. Recommendations include thinking of animal protein as a side on your plate rather than center stage and focusing instead on lower calorie dense options like vegetables, fruits, whole grains, and plant-based proteins, such as beans. Making sauces in large quantities to freeze for later and leaving the skin on your vegetables are also recommended to maximize your experience.  Cooking - Healthy Salads and Dressing Clinical staff conducted group or individual video education with verbal and written material and guidebook.  Patient learns that vegetables, fruits, whole grains, and legumes are the foundations of the Pritikin Eating Plan. Recommendations include how to incorporate each of these in flavorful and healthy salads, and how to create homemade salad dressings. Proper handling of ingredients is also covered. Cooking - Soups and [employer] - Soups and Desserts Clinical staff conducted group or individual video education with  verbal and written material and guidebook.  Patient learns that Pritikin soups and desserts make for easy, nutritious, and delicious snacks and meal components that are low in sodium, fat, sugar, and calorie density, while high in vitamins, minerals, and filling fiber. Recommendations include simple and healthy ideas for soups and desserts.   Overview     The Pritikin Solution Program Overview Clinical staff conducted group or individual video education with verbal and written  material and guidebook.  Patient learns that the results of the Pritikin Program have been documented in more than 100 articles published in peer-reviewed journals, and the benefits include reducing risk factors for (and, in some cases, even reversing) high cholesterol, high blood pressure, type 2 diabetes, obesity, and more! An overview of the three key pillars of the Pritikin Program will be covered: eating well, doing regular exercise, and having a healthy mind-set.  WORKSHOPS  Exercise: Exercise Basics: Building Your Action Plan Clinical staff led group instruction and group discussion with PowerPoint presentation and patient guidebook. To enhance the learning environment the use of posters, models and videos may be added. At the conclusion of this workshop, patients will comprehend the difference between physical activity and exercise, as well as the benefits of incorporating both, into their routine. Patients will understand the FITT (Frequency, Intensity, Time, and Type) principle and how to use it to build an exercise action plan. In addition, safety concerns and other considerations for exercise and cardiac rehab will be addressed by the presenter. The purpose of this lesson is to promote a comprehensive and effective weekly exercise routine in order to improve patients' overall level of fitness.   Managing Heart Disease: Your Path to a Healthier Heart Clinical staff led group instruction and group discussion with  PowerPoint presentation and patient guidebook. To enhance the learning environment the use of posters, models and videos may be added.At the conclusion of this workshop, patients will understand the anatomy and physiology of the heart. Additionally, they will understand how Pritikin's three pillars impact the risk factors, the progression, and the management of heart disease.  The purpose of this lesson is to provide a high-level overview of the heart, heart disease, and how the Pritikin lifestyle positively impacts risk factors.  Exercise Biomechanics Clinical staff led group instruction and group discussion with PowerPoint presentation and patient guidebook. To enhance the learning environment the use of posters, models and videos may be added. Patients will learn how the structural parts of their bodies function and how these functions impact their daily activities, movement, and exercise. Patients will learn how to promote a neutral spine, learn how to manage pain, and identify ways to improve their physical movement in order to promote healthy living. The purpose of this lesson is to expose patients to common physical limitations that impact physical activity. Participants will learn practical ways to adapt and manage aches and pains, and to minimize their effect on regular exercise. Patients will learn how to maintain good posture while sitting, walking, and lifting.  Balance Training and Fall Prevention  Clinical staff led group instruction and group discussion with PowerPoint presentation and patient guidebook. To enhance the learning environment the use of posters, models and videos may be added. At the conclusion of this workshop, patients will understand the importance of their sensorimotor skills (vision, proprioception, and the vestibular system) in maintaining their ability to balance as they age. Patients will apply a variety of balancing exercises that are appropriate for their  current level of function. Patients will understand the common causes for poor balance, possible solutions to these problems, and ways to modify their physical environment in order to minimize their fall risk. The purpose of this lesson is to teach patients about the importance of maintaining balance as they age and ways to minimize their risk of falling.  WORKSHOPS   Nutrition:  Fueling a Ship broker led group instruction and group discussion with PowerPoint presentation and patient guidebook.  To enhance the learning environment the use of posters, models and videos may be added. Patients will review the foundational principles of the Pritikin Eating Plan and understand what constitutes a serving size in each of the food groups. Patients will also learn Pritikin-friendly foods that are better choices when away from home and review make-ahead meal and snack options. Calorie density will be reviewed and applied to three nutrition priorities: weight maintenance, weight loss, and weight gain. The purpose of this lesson is to reinforce (in a group setting) the key concepts around what patients are recommended to eat and how to apply these guidelines when away from home by planning and selecting Pritikin-friendly options. Patients will understand how calorie density may be adjusted for different weight management goals.  Mindful Eating  Clinical staff led group instruction and group discussion with PowerPoint presentation and patient guidebook. To enhance the learning environment the use of posters, models and videos may be added. Patients will briefly review the concepts of the Pritikin Eating Plan and the importance of low-calorie dense foods. The concept of mindful eating will be introduced as well as the importance of paying attention to internal hunger signals. Triggers for non-hunger eating and techniques for dealing with triggers will be explored. The purpose of this lesson is to provide  patients with the opportunity to review the basic principles of the Pritikin Eating Plan, discuss the value of eating mindfully and how to measure internal cues of hunger and fullness using the Hunger Scale. Patients will also discuss reasons for non-hunger eating and learn strategies to use for controlling emotional eating.  Targeting Your Nutrition Priorities Clinical staff led group instruction and group discussion with PowerPoint presentation and patient guidebook. To enhance the learning environment the use of posters, models and videos may be added. Patients will learn how to determine their genetic susceptibility to disease by reviewing their family history. Patients will gain insight into the importance of diet as part of an overall healthy lifestyle in mitigating the impact of genetics and other environmental insults. The purpose of this lesson is to provide patients with the opportunity to assess their personal nutrition priorities by looking at their family history, their own health history and current risk factors. Patients will also be able to discuss ways of prioritizing and modifying the [facility] Eating Plan for their highest risk areas  Menu  Clinical staff led group instruction and group discussion with PowerPoint presentation and patient guidebook. To enhance the learning environment the use of posters, models and videos may be added. Using menus brought in from [employer], or printed from [employer], patients will apply the [facility] dining out guidelines that were presented in the [employer] video. Patients will also be able to practice these guidelines in a variety of provided scenarios. The purpose of this lesson is to provide patients with the opportunity to practice hands-on learning of the [facility] Dining Out guidelines with actual menus and practice scenarios.  Label Reading Clinical staff led group instruction and group discussion with PowerPoint  presentation and patient guidebook. To enhance the learning environment the use of posters, models and videos may be added. Patients will review and discuss the [facility] label reading guidelines presented in [employer]'s Label Reading Educational series video. Using fool labels brought in from local grocery stores and markets, patients will apply the label reading guidelines and determine if the packaged food meet the [facility] guidelines. The purpose of this lesson is to provide patients with the opportunity to  review, discuss, and practice hands-on learning of the [facility] Label Reading guidelines with actual packaged food labels. Cooking School  [facility]'s [employer] are designed to teach patients ways to prepare quick, simple, and affordable recipes at home. The importance of nutrition's role in chronic disease risk reduction is reflected in its emphasis in the overall [facility] program. By learning how to prepare essential core [facility] Eating Plan recipes, patients will increase control over what they eat; be able to customize the flavor of foods without the use of added salt, sugar, or fat; and improve the quality of the food they consume. By learning a set of core recipes which are easily assembled, quickly prepared, and affordable, patients are more likely to prepare more healthy foods at home. These workshops focus on convenient breakfasts, simple entres, side dishes, and desserts which can be prepared with minimal effort and are consistent with nutrition recommendations for cardiovascular risk reduction. Cooking [employer] are taught by a Armed forces logistics/support/administrative officer (RD) who has been trained by the [employer]. The chef or RD has a clear understanding of the importance of minimizing - if not completely eliminating - added fat, sugar, and sodium in recipes. Throughout the series of Cooking School Workshop sessions, patients will learn about healthy ingredients and  efficient methods of cooking to build confidence in their capability to prepare    Cooking School weekly topics:  Adding Flavor- Sodium-Free  Fast and Healthy Breakfasts  Powerhouse Plant-Based Proteins  Satisfying Salads and Dressings  Simple Sides and Sauces  International Cuisine-Spotlight on the [employer] Zones  Delicious Desserts  Savory Soups  [employer] - Meals in a Astronomer Appetizers and Snacks  Comforting Weekend Breakfasts  One-Pot Wonders   Fast Evening Meals  Landscape architect Your Pritikin Plate  WORKSHOPS   Healthy Mindset (Psychosocial):  Focused Goals, Sustainable Changes Clinical staff led group instruction and group discussion with PowerPoint presentation and patient guidebook. To enhance the learning environment the use of posters, models and videos may be added. Patients will be able to apply effective goal setting strategies to establish at least one personal goal, and then take consistent, meaningful action toward that goal. They will learn to identify common barriers to achieving personal goals and develop strategies to overcome them. Patients will also gain an understanding of how our mind-set can impact our ability to achieve goals and the importance of cultivating a positive and growth-oriented mind-set. The purpose of this lesson is to provide patients with a deeper understanding of how to set and achieve personal goals, as well as the tools and strategies needed to overcome common obstacles which may arise along the way.  From Head to Heart: The Power of a Healthy Outlook  Clinical staff led group instruction and group discussion with PowerPoint presentation and patient guidebook. To enhance the learning environment the use of posters, models and videos may be added. Patients will be able to recognize and describe the impact of emotions and mood on physical health. They will discover the importance of self-care and explore self-care  practices which may work for them. Patients will also learn how to utilize the 4 C's to cultivate a healthier outlook and better manage stress and challenges. The purpose of this lesson is to demonstrate to patients how a healthy outlook is an essential part of maintaining good health, especially as they continue their cardiac rehab journey.  Healthy Sleep for a Healthy Heart Clinical staff led group instruction and  group discussion with PowerPoint presentation and patient guidebook. To enhance the learning environment the use of posters, models and videos may be added. At the conclusion of this workshop, patients will be able to demonstrate knowledge of the importance of sleep to overall health, well-being, and quality of life. They will understand the symptoms of, and treatments for, common sleep disorders. Patients will also be able to identify daytime and nighttime behaviors which impact sleep, and they will be able to apply these tools to help manage sleep-related challenges. The purpose of this lesson is to provide patients with a general overview of sleep and outline the importance of quality sleep. Patients will learn about a few of the most common sleep disorders. Patients will also be introduced to the concept of "sleep hygiene," and discover ways to self-manage certain sleeping problems through simple daily behavior changes. Finally, the workshop will motivate patients by clarifying the links between quality sleep and their goals of heart-healthy living.   Recognizing and Reducing Stress Clinical staff led group instruction and group discussion with PowerPoint presentation and patient guidebook. To enhance the learning environment the use of posters, models and videos may be added. At the conclusion of this workshop, patients will be able to understand the types of stress reactions, differentiate between acute and chronic stress, and recognize the impact that chronic stress has on their health. They  will also be able to apply different coping mechanisms, such as reframing negative self-talk. Patients will have the opportunity to practice a variety of stress management techniques, such as deep abdominal breathing, progressive muscle relaxation, and/or guided imagery.  The purpose of this lesson is to educate patients on the role of stress in their lives and to provide healthy techniques for coping with it.  Learning Barriers/Preferences:  Learning Barriers/Preferences - 06/03/22 1003       Learning Barriers/Preferences   Learning Barriers Sight    Learning Preferences Audio;Computer/Internet;Pictoral;Video;Verbal Instruction             Education Topics:  Knowledge Questionnaire Score:  Knowledge Questionnaire Score - 06/03/22 1248       Knowledge Questionnaire Score   Pre Score 20/24             Core Components/Risk Factors/Patient Goals at Admission:  Personal Goals and Risk Factors at Admission - 06/03/22 1236       Core Components/Risk Factors/Patient Goals on Admission   Hypertension Yes    Intervention Provide education on lifestyle modifcations including regular physical activity/exercise, weight management, moderate sodium restriction and increased consumption of fresh fruit, vegetables, and low fat dairy, alcohol moderation, and smoking cessation.;Monitor prescription use compliance.    Expected Outcomes Short Term: Continued assessment and intervention until BP is < 140/36mm HG in hypertensive participants. < 130/39mm HG in hypertensive participants with diabetes, heart failure or chronic kidney disease.;Long Term: Maintenance of blood pressure at goal levels.    Lipids Yes    Intervention Provide education and support for participant on nutrition & aerobic/resistive exercise along with prescribed medications to achieve LDL 70mg , HDL >40mg .    Expected Outcomes Short Term: Participant states understanding of desired cholesterol values and is compliant with  medications prescribed. Participant is following exercise prescription and nutrition guidelines.;Long Term: Cholesterol controlled with medications as prescribed, with individualized exercise RX and with personalized nutrition plan. Value goals: LDL < 70mg , HDL > 40 mg.    Stress Yes    Intervention Offer individual and/or small group education and counseling on adjustment to heart disease,  stress management and health-related lifestyle change. Teach and support self-help strategies.;Refer participants experiencing significant psychosocial distress to appropriate mental health specialists for further evaluation and treatment. When possible, include family members and significant others in education/counseling sessions.    Expected Outcomes Short Term: Participant demonstrates changes in health-related behavior, relaxation and other stress management skills, ability to obtain effective social support, and compliance with psychotropic medications if prescribed.;Long Term: Emotional wellbeing is indicated by absence of clinically significant psychosocial distress or social isolation.             Core Components/Risk Factors/Patient Goals Review:   Goals and Risk Factor Review     Row Name 06/09/22 1334 07/01/22 1034           Core Components/Risk Factors/Patient Goals Review   Personal Goals Review Weight Management/Obesity;Stress;Hypertension;Lipids Weight Management/Obesity;Stress;Hypertension;Lipids      Review Robert Estes started intensive cardiac rehab on 06/09/22. VSS. Robert Estes did well with exercise. Robert Estes is doing well with exercise. Stated chest pain is musculoskeletal per cardiologist and not cardiac-related. Stated he is breathing better. VSS.      Expected Outcomes Robert Estes will continue to participate in intensive cardiac rehab for exercise, nutrition and lifestyle modifications Robert Estes will continue to participate in intensive cardiac rehab for exercise, nutrition and lifestyle modifications                Core Components/Risk Factors/Patient Goals at Discharge (Final Review):   Goals and Risk Factor Review - 07/01/22 1034       Core Components/Risk Factors/Patient Goals Review   Personal Goals Review Weight Management/Obesity;Stress;Hypertension;Lipids    Review Robert Estes is doing well with exercise. Stated chest pain is musculoskeletal per cardiologist and not cardiac-related. Stated he is breathing better. VSS.    Expected Outcomes Robert Estes will continue to participate in intensive cardiac rehab for exercise, nutrition and lifestyle modifications             ITP Comments:  ITP Comments     Row Name 06/03/22 0810 06/09/22 1326 07/01/22 1031       ITP Comments Robert Magic, MD:  Medical Director. Introduction to the [facility] education program / Intensive cardiac rehab.  Initial orientation packet reviewed with the patient. 30 Day ITP Review. Robert Estes started intensive cardiac rehab on 06/09/22 and did well with exercise 30 Day ITP Review. Robert Estes started intensive cardiac rehab on 06/09/22 and did well with exercise              Comments: See ITP comments.

## 2022-07-02 ENCOUNTER — Encounter (HOSPITAL_COMMUNITY)
Admission: RE | Admit: 2022-07-02 | Discharge: 2022-07-02 | Disposition: A | Discharge status: Home / Self Care | Payer: Medicare Other | Source: Ambulatory Visit | Attending: Cardiology | Admitting: Cardiology

## 2022-07-02 DIAGNOSIS — Z952 Presence of prosthetic heart valve: Secondary | ICD-10-CM

## 2022-07-02 DIAGNOSIS — Z955 Presence of coronary angioplasty implant and graft: Secondary | ICD-10-CM

## 2022-07-02 DIAGNOSIS — I214 Non-ST elevation (NSTEMI) myocardial infarction: Secondary | ICD-10-CM

## 2022-07-04 ENCOUNTER — Ambulatory Visit (HOSPITAL_COMMUNITY)
Admission: EM | Admit: 2022-07-04 | Discharge: 2022-07-04 | Disposition: A | Discharge status: Home / Self Care | Payer: Medicare Other | Attending: Internal Medicine | Admitting: Internal Medicine

## 2022-07-04 ENCOUNTER — Encounter (HOSPITAL_COMMUNITY): Payer: Medicare Other

## 2022-07-04 ENCOUNTER — Encounter (HOSPITAL_COMMUNITY): Payer: Self-pay | Admitting: Emergency Medicine

## 2022-07-04 DIAGNOSIS — R55 Syncope and collapse: Secondary | ICD-10-CM | POA: Diagnosis not present

## 2022-07-04 DIAGNOSIS — E860 Dehydration: Secondary | ICD-10-CM

## 2022-07-04 NOTE — Discharge Instructions (Addendum)
Your symptoms today are likely due to dehydration.  Please continue to drink plenty of water while you are at work and eat healthy meals.  Follow-up with your primary care provider/neurologist as needed.

## 2022-07-04 NOTE — ED Triage Notes (Signed)
Pt reports that while at work about hour and half ago when walked balance felt off. Reports lasted only a few seconds.Reports that he had a late lunch today.  Reports had helper at work today so didn't work to strenuously. Reports hasn't drank much water today.  Pt did have blood work done today at neurologist office.

## 2022-07-04 NOTE — ED Provider Notes (Signed)
[facility]    CSN: [id number] Arrival date & time: 07/04/22  1757      History   Chief Complaint Chief Complaint  Patient presents with   Gait Problem    HPI BREION NOVACEK is a 77 y.o. male.   Patient with history of MI, PVD, GERD, and CAD (on Plavix blood thinner) presents to urgent care for evaluation after near syncopal episode that happened at work today.  Patient works as an Journalist, newspaper in a temperature controlled area.  He states that he had an Geophysicist/field seismologist with him at work today and did not perform any strenuous activity or heavy lifting. At the end of the day 1-2 hours ago, he started to feel "faint" and had a 2-3 second episode of lightheadedness where he felt as though he was going to pass out while he was walking. He did not fall and was able to sit down. Symptoms resolved almost as quickly as they started. Denies preceding chest pain, heart palpitations, dizziness, unilateral weakness, paresthesias, shortness of breath, nausea, vomiting, or abdominal pain. No changes in speech or facial movements. He mentions he did not drink as much water today as he normally does and believes this could have contributed to symptoms. He has not experienced similar symptoms since episode 1-2 hours ago. Of note, he had blood work drawn at his neurologist today at [facility].      Past Medical History:  Diagnosis Date   Arthritis    "hands, knees, elbows, fingers" (01/25/2013)   Coronary artery disease    HX OF HEART STENTS X 2   2010--DR. H. SMITH IS PT'S CARDIOLOGIST, MILD AORTIC STENOSIS   Dizziness    Enlarged prostate    FLOMAX HELPS PT VOID   Exertional shortness of breath    GERD (gastroesophageal reflux disease)    OTC PREVACID AS NEEDED   Glaucoma    BOTH EYES   Gout    Hyperlipidemia    Hypertension    Hypertension    Iron deficiency anemia    hx   Migraine    "monthly" (01/25/2013)   Peripheral vascular disease (HCC)    CLAUDICATION    Patient Active  Problem List   Diagnosis Date Noted   Stage 3a chronic kidney disease (HCC) 02/16/2020   Neurogenic claudication 02/16/2020   Statin myopathy 09/01/2018   Pituitary tumor 02/19/2018   Deviated septum 08/17/2017   Nasal turbinate hypertrophy 08/17/2017   Pituitary adenoma (HCC) 08/17/2017   Contracture of joint of right hand 04/20/2015   Aortic stenosis 03/10/2014   Left carotid bruit 03/10/2014   Glaucoma    Hypertension    Enlarged prostate    Coronary artery disease    Peripheral vascular disease (HCC)    Dyslipidemia    GERD (gastroesophageal reflux disease)    Gout    Exertional shortness of breath    Iron deficiency anemia    Migraine    Arthritis    S/P laparoscopic cholecystectomy 01/25/2013    Past Surgical History:  Procedure Laterality Date   CHOLECYSTECTOMY N/A 01/25/2013   Procedure: LAPAROSCOPIC CHOLECYSTECTOMY WITH INTRAOPERATIVE CHOLANGIOGRAM;  Surgeon: Axel Filler, MD;  Location: [facility] OR;  Service: General;  Laterality: N/A;   CORONARY ANGIOPLASTY WITH STENT PLACEMENT  11/27/2008   "2" (01/25/2013)   EUS  12/24/2011   Procedure: FULL UPPER ENDOSCOPIC ULTRASOUND (EUS) RADIAL;  Surgeon: Willis Modena, MD;  Location: [facility];  Service: Endoscopy;  Laterality: N/A;   INCISION AND DRAINAGE  thrombosed hemorrhoid   LAPAROSCOPIC CHOLECYSTECTOMY  01/25/2013   RIGHT/LEFT HEART CATH AND CORONARY ANGIOGRAPHY N/A 11/13/2021   Procedure: RIGHT/LEFT HEART CATH AND CORONARY ANGIOGRAPHY;  Surgeon: Lyn Records, MD;  Location: [facility];  Service: Cardiovascular;  Laterality: N/A;       Home Medications    Prior to Admission medications   Medication Sig Start Date End Date Taking? Authorizing Provider  acetaminophen (TYLENOL) 500 MG tablet Take 500 mg by mouth every 6 (six) hours as needed for moderate pain.    [provider]  amoxicillin (AMOXIL) 500 MG capsule Take 500 mg by mouth as directed. Take 4 capsules by mouth as needed for  dental procedures 05/13/22 05/07/23  [provider]  aspirin 81 MG tablet Take 81 mg by mouth daily.    [provider]  clopidogrel (PLAVIX) 75 MG tablet Take 75 mg by mouth daily. 05/14/22 05/14/23  [provider]  eplerenone (INSPRA) 25 MG tablet Take 25 mg by mouth daily. 05/13/22 11/09/22  [provider]  Evolocumab (REPATHA SURECLICK) 140 MG/ML SOAJ INJECT 1 PEN INTO THE SKIN EVERY 14 DAYS 10/01/21   Lyn Records, MD  latanoprost (XALATAN) 0.005 % ophthalmic solution Place 1 drop into both eyes at bedtime. 11/06/21   [provider]  levocetirizine (XYZAL) 5 MG tablet Take 5 mg by mouth daily. 09/12/21   [provider]  lisinopril (ZESTRIL) 10 MG tablet Take 1 tablet by mouth daily. 05/14/22 05/14/23  [provider]  metoprolol succinate (TOPROL-XL) 50 MG 24 hr tablet Take 1 tablet (50 mg total) by mouth daily. 06/16/22   Meriam Sprague, MD  montelukast (SINGULAIR) 10 MG tablet Take 10 mg by mouth at bedtime. 01/07/22   [provider]  Multiple Vitamin (MULTIVITAMIN) tablet Take 1 tablet by mouth daily. Patient not taking: Reported on 06/03/2022    [provider]  nitroGLYCERIN (NITROSTAT) 0.4 MG SL tablet Place 1 tablet (0.4 mg total) under the tongue every 5 (five) minutes as needed for chest pain. 11/29/21   Lyn Records, MD  polyethylene glycol (MIRALAX / GLYCOLAX) 17 g packet Take 17 g by mouth daily as needed for mild constipation. 04/17/22   [provider]  rosuvastatin (CRESTOR) 40 MG tablet Take 1 tablet by mouth at bedtime. 04/17/22 04/17/23  [provider]  sodium chloride (OCEAN) 0.65 % SOLN nasal spray Place 1 spray into both nostrils as needed for congestion.    [provider]  tamsulosin (FLOMAX) 0.4 MG CAPS capsule Take 0.4 mg by mouth daily.    [provider]  testosterone cypionate (DEPOTESTOSTERONE CYPIONATE) 200 MG/ML injection Inject 200 mg into the  muscle every 14 (fourteen) days. 09/12/21   [provider]  TURMERIC PO Take 2 capsules by mouth daily.    [provider]  zolpidem (AMBIEN) 10 MG tablet TAKE 1 TABLET(10 MG) BY MOUTH AT BEDTIME AS NEEDED FOR SLEEP 07/21/20   Sagardia, Eilleen Kempf, MD    Family History Family History  Problem Relation Age of Onset   Breast cancer Mother    Cancer Mother    Heart failure Mother    Heart disease Father    Heart attack Father     Social History Social History   Tobacco Use   Smoking status: Never   Smokeless tobacco: Never  Vaping Use   Vaping Use: Never used  Substance Use Topics   Alcohol use: Yes    Comment: Drinks a  beer or glass of wine 1-2 times per month    Drug use: No     Allergies   Codeine   Review of Systems Review of Systems Per HPI  Physical Exam Triage Vital Signs ED Triage Vitals  Enc Vitals Group     BP 07/04/22 1835 107/67     Pulse Rate 07/04/22 1835 68     Resp 07/04/22 1835 16     Temp 07/04/22 1835 97.7 F (36.5 C)     Temp Source 07/04/22 1835 Oral     SpO2 07/04/22 1835 97 %     Weight --      Height --      Head Circumference --      Peak Flow --      Pain Score 07/04/22 1834 0     Pain Loc --      Pain Edu? --      Excl. in GC? --    No data found.  Updated Vital Signs BP 107/67 (BP Location: Left Arm)   Pulse 68   Temp 97.7 F (36.5 C) (Oral)   Resp 16   SpO2 97%   Visual Acuity Right Eye Distance:   Left Eye Distance:   Bilateral Distance:    Right Eye Near:   Left Eye Near:    Bilateral Near:     Physical Exam Vitals and nursing note reviewed.  Constitutional:      Appearance: He is not ill-appearing or toxic-appearing.  HENT:     Head: Normocephalic and atraumatic.     Right Ear: Hearing, tympanic membrane, ear canal and external ear normal.     Left Ear: Hearing, tympanic membrane, ear canal and external ear normal.     Nose: Nose normal.     Mouth/Throat:     Lips: Pink.     Mouth:  Mucous membranes are moist. No injury.     Tongue: No lesions. Tongue does not deviate from midline.     Palate: No mass and lesions.     Pharynx: Oropharynx is clear. Uvula midline. No pharyngeal swelling, oropharyngeal exudate, posterior oropharyngeal erythema or uvula swelling.     Tonsils: No tonsillar exudate or tonsillar abscesses.  Eyes:     General: Lids are normal. Vision grossly intact. Gaze aligned appropriately.     Extraocular Movements: Extraocular movements intact.     Conjunctiva/sclera: Conjunctivae normal.  Cardiovascular:     Rate and Rhythm: Normal rate and regular rhythm.     Heart sounds: Normal heart sounds, S1 normal and S2 normal.  Pulmonary:     Effort: Pulmonary effort is normal. No respiratory distress.     Breath sounds: Normal breath sounds and air entry.  Musculoskeletal:     Cervical back: Neck supple.     Right lower leg: No edema.     Left lower leg: No edema.  Lymphadenopathy:     Cervical: No cervical adenopathy.  Skin:    General: Skin is warm and dry.     Capillary Refill: Capillary refill takes less than 2 seconds.     Findings: No rash.  Neurological:     General: No focal deficit present.     Mental Status: He is alert and oriented to person, place, and time. Mental status is at baseline.     Cranial Nerves: Cranial nerves 2-12 are intact. No dysarthria or facial asymmetry.     Sensory: Sensation is intact.     Motor: Motor function is intact.  Coordination: Coordination is intact.     Gait: Gait is intact.     Comments: Strength and sensation intact to bilateral upper and lower extremities (5/5). Moves all 4 extremities with normal coordination voluntarily. Non-focal neuro exam.   Psychiatric:        Mood and Affect: Mood normal.        Speech: Speech normal.        Behavior: Behavior normal.        Thought Content: Thought content normal.        Judgment: Judgment normal.      UC Treatments / Results  Labs (all labs ordered  are listed, but only abnormal results are displayed) Labs Reviewed - No data to display  EKG   Radiology No results found.  Procedures Procedures (including critical care time)  Medications Ordered in UC Medications - No data to display  Initial Impression / Assessment and Plan / UC Course  I have reviewed the triage vital signs and the nursing notes.  Pertinent labs & imaging results that were available during my care of the patient were reviewed by me and considered in my medical decision making (see chart for details).   1. Near syncope, dehydration Unclear etiology of patient's symptoms, however suspect he likely became dehydrated and this affected balance. He does not appear to be clinically dehydrated. Vital signs are hemodynamically stable and he is overall well appearing. Low suspicion for MI, CVA, and benign positional vertigo etiology. Labs reviewed from most recent visit to provider office at cardiologist at [facility] on 06/25/22 and are to his baseline. Neurologic exam is without focal deficit. EKG shows normal sinus rhythm without concerning ST/T wave changes and shows similar findings to most recent EKG without changes. He is tolerating fluids in the office currently. Advised to eat regular meals and drink plenty of water to prevent dehydration. He is agreeable with plan.   Discussed physical exam and available lab work findings in clinic with patient.  Counseled patient regarding appropriate use of medications and potential side effects for all medications recommended or prescribed today. Discussed red flag signs and symptoms of worsening condition,when to call the PCP office, return to urgent care, and when to seek higher level of care in the emergency department. Patient verbalizes understanding and agreement with plan. All questions answered. Patient discharged in stable condition.   Final Clinical Impressions(s) / UC Diagnoses   Final diagnoses:  Near syncope  Dehydration      Discharge Instructions      Your symptoms today are likely due to dehydration.  Please continue to drink plenty of water while you are at work and eat healthy meals.  Follow-up with your primary care provider/neurologist as needed.     ED Prescriptions   None    PDMP not reviewed this encounter.   Carlisle Beers, [place] 07/05/22 2136

## 2022-07-07 ENCOUNTER — Encounter (HOSPITAL_COMMUNITY)
Admission: RE | Admit: 2022-07-07 | Discharge: 2022-07-07 | Disposition: A | Discharge status: Home / Self Care | Payer: Medicare Other | Source: Ambulatory Visit | Attending: Cardiology | Admitting: Cardiology

## 2022-07-07 DIAGNOSIS — I214 Non-ST elevation (NSTEMI) myocardial infarction: Secondary | ICD-10-CM

## 2022-07-07 DIAGNOSIS — Z955 Presence of coronary angioplasty implant and graft: Secondary | ICD-10-CM

## 2022-07-07 DIAGNOSIS — Z952 Presence of prosthetic heart valve: Secondary | ICD-10-CM | POA: Diagnosis not present

## 2022-07-09 ENCOUNTER — Encounter (HOSPITAL_COMMUNITY)
Admission: RE | Admit: 2022-07-09 | Discharge: 2022-07-09 | Disposition: A | Discharge status: Home / Self Care | Payer: Medicare Other | Source: Ambulatory Visit | Attending: Cardiology | Admitting: Cardiology

## 2022-07-09 DIAGNOSIS — I214 Non-ST elevation (NSTEMI) myocardial infarction: Secondary | ICD-10-CM

## 2022-07-09 DIAGNOSIS — Z952 Presence of prosthetic heart valve: Secondary | ICD-10-CM | POA: Diagnosis not present

## 2022-07-09 DIAGNOSIS — Z955 Presence of coronary angioplasty implant and graft: Secondary | ICD-10-CM

## 2022-07-09 NOTE — Progress Notes (Signed)
Reviewed home exercise Rx with patient today.  Encouraged warm-up, cool-down, and stretching. Reviewed THRR of  58 - 117 and keeping RPE between 11-13. Encouraged to hydrate with activity.  Reviewed weather parameters for temperature and humidity for safe exercise outdoors. Reviewed S/S to terminate exercise and when to call 911 vs MD. Reviewed the use of NTG and pt was encouraged to carry at all times. Pt encouraged to always carry a cell phone for safety when exercising outdoors. Pt verbalized understanding of the home exercise Rx and was provided a copy.    Ralph Brouwer MS, ACSM-CEP, CCRP  

## 2022-07-11 ENCOUNTER — Encounter (HOSPITAL_COMMUNITY)
Admission: RE | Admit: 2022-07-11 | Discharge: 2022-07-11 | Disposition: A | Discharge status: Home / Self Care | Payer: Medicare Other | Source: Ambulatory Visit | Attending: Cardiology | Admitting: Cardiology

## 2022-07-11 DIAGNOSIS — Z952 Presence of prosthetic heart valve: Secondary | ICD-10-CM

## 2022-07-11 DIAGNOSIS — I214 Non-ST elevation (NSTEMI) myocardial infarction: Secondary | ICD-10-CM

## 2022-07-11 DIAGNOSIS — Z955 Presence of coronary angioplasty implant and graft: Secondary | ICD-10-CM

## 2022-07-14 ENCOUNTER — Encounter (HOSPITAL_COMMUNITY)
Admission: RE | Admit: 2022-07-14 | Discharge: 2022-07-14 | Disposition: A | Discharge status: Home / Self Care | Payer: Medicare Other | Source: Ambulatory Visit | Attending: Cardiology | Admitting: Cardiology

## 2022-07-14 DIAGNOSIS — Z952 Presence of prosthetic heart valve: Secondary | ICD-10-CM | POA: Diagnosis not present

## 2022-07-16 ENCOUNTER — Encounter (HOSPITAL_COMMUNITY)
Admission: RE | Admit: 2022-07-16 | Discharge: 2022-07-16 | Disposition: A | Discharge status: Home / Self Care | Payer: Medicare Other | Source: Ambulatory Visit | Attending: Cardiology | Admitting: Cardiology

## 2022-07-16 DIAGNOSIS — Z955 Presence of coronary angioplasty implant and graft: Secondary | ICD-10-CM

## 2022-07-16 DIAGNOSIS — Z952 Presence of prosthetic heart valve: Secondary | ICD-10-CM

## 2022-07-16 DIAGNOSIS — I214 Non-ST elevation (NSTEMI) myocardial infarction: Secondary | ICD-10-CM

## 2022-07-18 ENCOUNTER — Encounter (HOSPITAL_COMMUNITY)
Admission: RE | Admit: 2022-07-18 | Discharge: 2022-07-18 | Disposition: A | Discharge status: Home / Self Care | Payer: Medicare Other | Source: Ambulatory Visit | Attending: Cardiology | Admitting: Cardiology

## 2022-07-18 DIAGNOSIS — Z955 Presence of coronary angioplasty implant and graft: Secondary | ICD-10-CM

## 2022-07-18 DIAGNOSIS — I214 Non-ST elevation (NSTEMI) myocardial infarction: Secondary | ICD-10-CM

## 2022-07-18 DIAGNOSIS — Z952 Presence of prosthetic heart valve: Secondary | ICD-10-CM | POA: Diagnosis not present

## 2022-07-21 ENCOUNTER — Encounter (HOSPITAL_COMMUNITY)
Admission: RE | Admit: 2022-07-21 | Discharge: 2022-07-21 | Disposition: A | Discharge status: Home / Self Care | Payer: Medicare Other | Source: Ambulatory Visit | Attending: Cardiology | Admitting: Cardiology

## 2022-07-21 DIAGNOSIS — Z952 Presence of prosthetic heart valve: Secondary | ICD-10-CM

## 2022-07-21 DIAGNOSIS — I214 Non-ST elevation (NSTEMI) myocardial infarction: Secondary | ICD-10-CM

## 2022-07-21 DIAGNOSIS — Z955 Presence of coronary angioplasty implant and graft: Secondary | ICD-10-CM

## 2022-07-23 ENCOUNTER — Encounter (HOSPITAL_COMMUNITY)
Admission: RE | Admit: 2022-07-23 | Discharge: 2022-07-23 | Disposition: A | Discharge status: Home / Self Care | Payer: Medicare Other | Source: Ambulatory Visit | Attending: Cardiology | Admitting: Cardiology

## 2022-07-23 DIAGNOSIS — I214 Non-ST elevation (NSTEMI) myocardial infarction: Secondary | ICD-10-CM

## 2022-07-23 DIAGNOSIS — Z955 Presence of coronary angioplasty implant and graft: Secondary | ICD-10-CM

## 2022-07-23 DIAGNOSIS — Z952 Presence of prosthetic heart valve: Secondary | ICD-10-CM

## 2022-07-25 ENCOUNTER — Encounter (HOSPITAL_COMMUNITY)
Admission: RE | Admit: 2022-07-25 | Discharge: 2022-07-25 | Disposition: A | Discharge status: Home / Self Care | Payer: Medicare Other | Source: Ambulatory Visit | Attending: Cardiology | Admitting: Cardiology

## 2022-07-25 DIAGNOSIS — I214 Non-ST elevation (NSTEMI) myocardial infarction: Secondary | ICD-10-CM

## 2022-07-25 DIAGNOSIS — Z952 Presence of prosthetic heart valve: Secondary | ICD-10-CM

## 2022-07-25 DIAGNOSIS — Z955 Presence of coronary angioplasty implant and graft: Secondary | ICD-10-CM

## 2022-07-25 LAB — GLUCOSE, CAPILLARY: Glucose-Capillary: 100 mg/dL — ABNORMAL HIGH (ref 70–99)

## 2022-07-25 NOTE — Progress Notes (Addendum)
At 8:10 pt reported that after his shower this morning, he felt "nervous and was shaking all over". He had breakfast (waffles, cereal, coffee, orange juice). He did take a benadryl last night that he has not taken before because he has not been sleeping well. BP 120/70, P 65 NSR, O2 sat 99%. CBG 100. Pt stated he feels "normal" now, but his right hand had tremors. He denies dizziness. He stated he will see Dr. Nedra Hai at [facility]. He will attend education class, but not exercise class today.

## 2022-07-28 ENCOUNTER — Encounter (HOSPITAL_COMMUNITY)
Admission: RE | Admit: 2022-07-28 | Discharge: 2022-07-28 | Disposition: A | Discharge status: Home / Self Care | Payer: Medicare Other | Source: Ambulatory Visit | Attending: Cardiology | Admitting: Cardiology

## 2022-07-28 DIAGNOSIS — I214 Non-ST elevation (NSTEMI) myocardial infarction: Secondary | ICD-10-CM

## 2022-07-28 DIAGNOSIS — Z955 Presence of coronary angioplasty implant and graft: Secondary | ICD-10-CM | POA: Diagnosis present

## 2022-07-28 DIAGNOSIS — I252 Old myocardial infarction: Secondary | ICD-10-CM | POA: Insufficient documentation

## 2022-07-28 DIAGNOSIS — Z48812 Encounter for surgical aftercare following surgery on the circulatory system: Secondary | ICD-10-CM | POA: Diagnosis not present

## 2022-07-28 DIAGNOSIS — Z952 Presence of prosthetic heart valve: Secondary | ICD-10-CM

## 2022-07-29 NOTE — Progress Notes (Signed)
Cardiac Individual Treatment Plan  Patient Details  Name: TYRON NAKAO MRN: [id number] Date of Birth: [birth year] Referring Provider:   Flowsheet Row INTENSIVE CARDIAC REHAB ORIENT from 06/03/2022 in [facility]  Referring Provider Dr. Keitha Butte, MD covering)       Initial Encounter Date:  Flowsheet Row INTENSIVE CARDIAC REHAB ORIENT from 06/03/2022 in [facility]  Date 06/03/22       Visit Diagnosis: 05/10/22 NSTEMI at [facility]  05/10/22 S/P DES x 3 at [facility]  04/16/22 S/P TAVR at  [facility]  Patient's Home Medications on Admission:  Current Outpatient Medications:    acetaminophen (TYLENOL) 500 MG tablet, Take 500 mg by mouth every 6 (six) hours as needed for moderate pain., Disp: , Rfl:    amoxicillin (AMOXIL) 500 MG capsule, Take 500 mg by mouth as directed. Take 4 capsules by mouth as needed for dental procedures, Disp: , Rfl:    aspirin 81 MG tablet, Take 81 mg by mouth daily., Disp: , Rfl:    clopidogrel (PLAVIX) 75 MG tablet, Take 75 mg by mouth daily., Disp: , Rfl:    eplerenone (INSPRA) 25 MG tablet, Take 25 mg by mouth daily., Disp: , Rfl:    Evolocumab (REPATHA SURECLICK) 140 MG/ML SOAJ, INJECT 1 PEN INTO THE SKIN EVERY 14 DAYS, Disp: 2 mL, Rfl: 11   latanoprost (XALATAN) 0.005 % ophthalmic solution, Place 1 drop into both eyes at bedtime., Disp: , Rfl:    levocetirizine (XYZAL) 5 MG tablet, Take 5 mg by mouth daily., Disp: , Rfl:    lisinopril (ZESTRIL) 10 MG tablet, Take 1 tablet by mouth daily., Disp: , Rfl:    metoprolol succinate (TOPROL-XL) 50 MG 24 hr tablet, Take 1 tablet (50 mg total) by mouth daily., Disp: 30 tablet, Rfl: 0   montelukast (SINGULAIR) 10 MG tablet, Take 10 mg by mouth at bedtime., Disp: , Rfl:    Multiple Vitamin (MULTIVITAMIN) tablet, Take 1 tablet by mouth daily. (Patient not taking: Reported on  06/03/2022), Disp: , Rfl:    nitroGLYCERIN (NITROSTAT) 0.4 MG SL tablet, Place 1 tablet (0.4 mg total) under the tongue every 5 (five) minutes as needed for chest pain., Disp: 25 tablet, Rfl: 11   polyethylene glycol (MIRALAX / GLYCOLAX) 17 g packet, Take 17 g by mouth daily as needed for mild constipation., Disp: , Rfl:    rosuvastatin (CRESTOR) 40 MG tablet, Take 1 tablet by mouth at bedtime., Disp: , Rfl:    sodium chloride (OCEAN) 0.65 % SOLN nasal spray, Place 1 spray into both nostrils as needed for congestion., Disp: , Rfl:    tamsulosin (FLOMAX) 0.4 MG CAPS capsule, Take 0.4 mg by mouth daily., Disp: , Rfl:    testosterone cypionate (DEPOTESTOSTERONE CYPIONATE) 200 MG/ML injection, Inject 200 mg into the muscle every 14 (fourteen) days., Disp: , Rfl:    TURMERIC PO, Take 2 capsules by mouth daily., Disp: , Rfl:    zolpidem (AMBIEN) 10 MG tablet, TAKE 1 TABLET(10 MG) BY MOUTH AT BEDTIME AS NEEDED FOR SLEEP, Disp: 30 tablet, Rfl: 0  Past Medical History: Past Medical History:  Diagnosis Date   Arthritis    "hands, knees, elbows, fingers" (01/25/2013)   Coronary artery disease    HX OF HEART STENTS X 2   2010--DR. H. SMITH IS PT'S CARDIOLOGIST, MILD AORTIC STENOSIS   Dizziness    Enlarged prostate  FLOMAX HELPS PT VOID   Exertional shortness of breath    GERD (gastroesophageal reflux disease)    OTC PREVACID AS NEEDED   Glaucoma    BOTH EYES   Gout    Hyperlipidemia    Hypertension    Hypertension    Iron deficiency anemia    hx   Migraine    "monthly" (01/25/2013)   Peripheral vascular disease (HCC)    CLAUDICATION    Tobacco Use: Social History   Tobacco Use  Smoking Status Never  Smokeless Tobacco Never    Labs: Review Flowsheet  More data exists      Latest Ref Rng & Units 09/22/2018 11/22/2018 08/22/2019 02/20/2020 11/13/2021  Labs for ITP Cardiac and Pulmonary Rehab  Cholestrol 100 - 199 mg/dL 161  096  045  409  -  LDL (calc) 0 - 99 mg/dL 71  79  45  88  -   HDL-C >39 mg/dL 33  36  38  34  -  Trlycerides 0 - 149 mg/dL 811  914  782  956  -  PH, Arterial 7.35 - 7.45 - - - - 7.376   PCO2 arterial 32 - 48 mmHg - - - - 37.8   Bicarbonate 20.0 - 28.0 mmol/L - - - - 22.2  24.6  22.9   TCO2 22 - 32 mmol/L - - - - 23  26  24    Acid-base deficit 0.0 - 2.0 mmol/L - - - - 3.0  1.0  2.0   O2 Saturation % - - - - 95  77  79     Capillary Blood Glucose: Lab Results  Component Value Date   GLUCAP 100 (H) 07/25/2022   GLUCAP 84 05/10/2022   GLUCAP 137 (H) 09/03/2017     Exercise Target Goals: Exercise Program Goal: Individual exercise prescription set using results from initial 6 min walk test and THRR while considering  patient's activity barriers and safety.   Exercise Prescription Goal: Initial exercise prescription builds to 30-45 minutes a day of aerobic activity, 2-3 days per week.  Home exercise guidelines will be given to patient during program as part of exercise prescription that the participant will acknowledge.  Activity Barriers & Risk Stratification:  Activity Barriers & Cardiac Risk Stratification - 06/03/22 0813       Activity Barriers & Cardiac Risk Stratification   Activity Barriers Back Problems;Arthritis;Neck/Spine Problems;Joint Problems;Deconditioning;Muscular Weakness;Chest Pain/Angina;Balance Concerns    Cardiac Risk Stratification High             6 Minute Walk:  6 Minute Walk     Row Name 06/03/22 0919         6 Minute Walk   Phase Initial     Distance 1583 feet     Walk Time 6 minutes     # of Rest Breaks 0     MPH 3     METS 3.5     RPE 7     Perceived Dyspnea  0     VO2 Peak 12.1     Symptoms Yes (comment)     Comments Left great toe pain 2/10 (gout)     Resting HR 73 bpm     Resting BP 110/60     Resting Oxygen Saturation  98 %     Exercise Oxygen Saturation  during 6 min walk 100 %     Max Ex. HR 112 bpm     Max Ex. BP 130/72  2 Minute Post BP 122/70              Oxygen Initial  Assessment:   Oxygen Re-Evaluation:   Oxygen Discharge (Final Oxygen Re-Evaluation):   Initial Exercise Prescription:  Initial Exercise Prescription - 06/03/22 0900       Date of Initial Exercise RX and Referring Provider   Date 06/03/22    Referring Provider Dr. Keitha Butte, MD covering)    Expected Discharge Date 08/15/22      NuStep   Level 2    SPM 80    Minutes 15    METs 3.5      Arm Ergometer   Level 1.5    Watts 25    RPM 60    Minutes 15    METs 3.5      Prescription Details   Frequency (times per week) 3    Duration Progress to 30 minutes of continuous aerobic without signs/symptoms of physical distress      Intensity   THRR 40-80% of Max Heartrate 58-117    Ratings of Perceived Exertion 11-13    Perceived Dyspnea 0-4      Progression   Progression Continue progressive overload as per policy without signs/symptoms or physical distress.      Resistance Training   Training Prescription Yes    Weight 3 lbs    Reps 10-15             Perform Capillary Blood Glucose checks as needed.  Exercise Prescription Changes:   Exercise Prescription Changes     Row Name 06/09/22 1000 06/25/22 1400 07/09/22 1100 07/16/22 1100       Response to Exercise   Blood Pressure (Admit) 110/72 108/66 120/58 120/60    Blood Pressure (Exercise) 140/72 122/60 136/64 140/62    Blood Pressure (Exit) 108/60 118/56 108/68 114/70    Heart Rate (Admit) 61 bpm 67 bpm 63 bpm 74 bpm    Heart Rate (Exercise) 97 bpm 81 bpm 89 bpm 97 bpm    Heart Rate (Exit) 70 bpm 66 bpm 68 bpm 58 bpm    Rating of Perceived Exertion (Exercise) 11 12 12 13     Symptoms None None None None    Comments Pt's first day in the CRP2 program Reviewed METs Reviewed Goals/Home Ex Rx Reviewed METs    Duration Continue with 30 min of aerobic exercise without signs/symptoms of physical distress. Continue with 30 min of aerobic exercise without signs/symptoms of physical distress. Continue with 30  min of aerobic exercise without signs/symptoms of physical distress. Continue with 30 min of aerobic exercise without signs/symptoms of physical distress.    Intensity THRR unchanged THRR unchanged THRR unchanged THRR unchanged      Progression   Progression Continue to progress workloads to maintain intensity without signs/symptoms of physical distress. Continue to progress workloads to maintain intensity without signs/symptoms of physical distress. Continue to progress workloads to maintain intensity without signs/symptoms of physical distress. Continue to progress workloads to maintain intensity without signs/symptoms of physical distress.    Average METs 2.25 2.6 2.7 3.5      Resistance Training   Training Prescription Yes No No No    Weight 3 lbs No wts on Wednesddays No wts on Wednesddays No wts on Wednesddays    Reps 10-15 -- -- --    Time 10 Minutes -- -- --      Interval Training   Interval Training No No No No      NuStep  Level 2 2 3 3     SPM 78 84 93 100    Minutes 15 30 15 15     METs 2.3 2.6 3 3.5      Arm Ergometer   Level 1.5 1.5 1.5 1.5    Watts 13 -- 19 --    RPM 49 -- 56 --    Minutes 15 -- 15 15    METs 2.2 -- 2.4 --      Home Exercise Plan   Plans to continue exercise at -- -- Home (comment) Home (comment)    Frequency -- -- Add 2 additional days to program exercise sessions. Add 2 additional days to program exercise sessions.    Initial Home Exercises Provided -- -- 07/09/22 07/09/22             Exercise Comments:   Exercise Comments     Row Name 06/09/22 1053 06/25/22 1449 07/09/22 1118 07/16/22 1124     Exercise Comments Pt's first day in the CRP2 program. Pt exercised with no complaints and is off to a good start. Reviewed METs. Pt is making progressin the program. 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 Reviewed goals and home Exercise Rx. Pt is progressing well. Pt walks some at home. Discussed home exercise Rx and pt  will walk at least 2x/week for 30 minutes in addtion to the 3x/week in the CRP2 program. Goal is 150+ minutes of exercise per week. Pt verbalized understanding of the home exericse Rx and was provided a copy. Reviewed METs. Pt is progressing. Will increase workload on nustep next session             Exercise Goals and Review:   Exercise Goals     Row Name 06/03/22 4098             Exercise Goals   Increase Physical Activity Yes       Intervention Provide advice, education, support and counseling about physical activity/exercise needs.;Develop an individualized exercise prescription for aerobic and resistive training based on initial evaluation findings, risk stratification, comorbidities and participant's personal goals.       Expected Outcomes Short Term: Attend rehab on a regular basis to increase amount of physical activity.;Long Term: Add in home exercise to make exercise part of routine and to increase amount of physical activity.;Long Term: Exercising regularly at least 3-5 days a week.       Increase Strength and Stamina Yes       Intervention Provide advice, education, support and counseling about physical activity/exercise needs.;Develop an individualized exercise prescription for aerobic and resistive training based on initial evaluation findings, risk stratification, comorbidities and participant's personal goals.       Expected Outcomes Short Term: Increase workloads from initial exercise prescription for resistance, speed, and METs.;Short Term: Perform resistance training exercises routinely during rehab and add in resistance training at home;Long Term: Improve cardiorespiratory fitness, muscular endurance and strength as measured by increased METs and functional capacity ( )       Able to understand and use rate of perceived exertion (RPE) scale Yes       Intervention Provide education and explanation on how to use RPE scale       Expected Outcomes Short Term: Able to use  RPE daily in rehab to express subjective intensity level;Long Term:  Able to use RPE to guide intensity level when exercising independently       Knowledge and understanding of Target  Heart Rate Range (THRR) Yes       Intervention Provide education and explanation of THRR including how the numbers were predicted and where they are located for reference       Expected Outcomes Short Term: Able to state/look up THRR;Short Term: Able to use daily as guideline for intensity in rehab;Long Term: Able to use THRR to govern intensity when exercising independently       Understanding of Exercise Prescription Yes       Intervention Provide education, explanation, and written materials on patient's individual exercise prescription       Expected Outcomes Short Term: Able to explain program exercise prescription;Long Term: Able to explain home exercise prescription to exercise independently                Exercise Goals Re-Evaluation :  Exercise Goals Re-Evaluation     Row Name 06/09/22 1051 07/09/22 1115           Exercise Goal Re-Evaluation   Exercise Goals Review Increase Physical Activity;Increase Strength and Stamina;Able to understand and use rate of perceived exertion (RPE) scale;Knowledge and understanding of Target Heart Rate Range (THRR);Understanding of Exercise Prescription Increase Physical Activity;Increase Strength and Stamina;Able to understand and use rate of perceived exertion (RPE) scale;Knowledge and understanding of Target Heart Rate Range (THRR);Understanding of Exercise Prescription      Comments Pt's first day in the CRP2 program. Pt understands the Exercise Rx, RPE scale, and THRR. Reviewed goals and home exercise Rx with patient today. Pt voices he is exceeding his short term goal of increased strength, stamina and energy. Pt voices feeling much better. Pt has also achieved his long term goal of returning to work. Pt has peak METs of 3.0.      Expected Outcomes Will continue to  monitor the patient and progress exercise workloads as tolerated. Will continue to monitor the patient and progress exercise workloads as tolerated.               Discharge Exercise Prescription (Final Exercise Prescription Changes):  Exercise Prescription Changes - 07/16/22 1100       Response to Exercise   Blood Pressure (Admit) 120/60    Blood Pressure (Exercise) 140/62    Blood Pressure (Exit) 114/70    Heart Rate (Admit) 74 bpm    Heart Rate (Exercise) 97 bpm    Heart Rate (Exit) 58 bpm    Rating of Perceived Exertion (Exercise) 13    Symptoms None    Comments Reviewed METs    Duration Continue with 30 min of aerobic exercise without signs/symptoms of physical distress.    Intensity THRR unchanged      Progression   Progression Continue to progress workloads to maintain intensity without signs/symptoms of physical distress.    Average METs 3.5      Resistance Training   Training Prescription No    Weight No wts on Wednesddays      Interval Training   Interval Training No      NuStep   Level 3    SPM 100    Minutes 15    METs 3.5      Arm Ergometer   Level 1.5    Minutes 15      Home Exercise Plan   Plans to continue exercise at Home (comment)    Frequency Add 2 additional days to program exercise sessions.    Initial Home Exercises Provided 07/09/22  Nutrition:  Target Goals: Understanding of nutrition guidelines, daily intake of sodium 1500mg , cholesterol 200mg , calories 30% from fat and 7% or less from saturated fats, daily to have 5 or more servings of fruits and vegetables.  Biometrics:  Pre Biometrics - 06/03/22 0845       Pre Biometrics   Waist Circumference 36 inches    Hip Circumference 39.5 inches    Waist to Hip Ratio 0.91 %    Triceps Skinfold 12 mm    % Body Fat 20.6 %    Grip Strength 42 kg    Flexibility 14.5 in    Single Leg Stand 6.78 seconds              Nutrition Therapy Plan and Nutrition Goals:   Nutrition Therapy & Goals - 07/11/22 0953       Nutrition Therapy   Diet Heart Healthy Diet      Personal Nutrition Goals   Nutrition Goal Patient to identify strategies for reducing cardiovascular risk by attending the [employer] education and nutrition series weekly.    Personal Goal #2 Patient to improve diet quality by using the plate method as a guide for meal planning to include lean protein/plant protein, fruits, vegetables, whole grains, nonfat dairy as part of a well-balanced diet.    Personal Goal #3 Patient to reduce sodium intake to 1500mg  per day    Comments Goals in action. Gariel continues to attend the [employer] and nutrition series regularly. He has started making many dietary changes including reduced saturated fat and increased dietary fiber. He is down 5.5# since starting with our program; he is motivated to maintain weight. We have disucssed multiple strategies for increasing calories including increasing eating frequency, increasing carbohdyrate/protein/fat intake, etc. Alanzo will benefit form participation in intensive cardiac rehab for nutrition, exercise, and lifestyle modification.      Intervention Plan   Intervention Nutrition handout(s) given to patient.;Prescribe, educate and counsel regarding individualized specific dietary modifications aiming towards targeted core components such as weight, hypertension, lipid management, diabetes, heart failure and other comorbidities.    Expected Outcomes Short Term Goal: Understand basic principles of dietary content, such as calories, fat, sodium, cholesterol and nutrients.;Long Term Goal: Adherence to prescribed nutrition plan.             Nutrition Assessments:  Nutrition Assessments - 06/09/22 1014       Rate Your Plate Scores   Pre Score 69            MEDIFICTS Score Key: ?70 Need to make dietary changes  40-70 Heart Healthy Diet ? 40 Therapeutic Level Cholesterol Diet   Flowsheet Row INTENSIVE  CARDIAC REHAB from 06/09/2022 in [facility]  Picture Your Plate Total Score on Admission 69      Picture Your Plate Scores: <27 Unhealthy dietary pattern with much room for improvement. 41-50 Dietary pattern unlikely to meet recommendations for good health and room for improvement. 51-60 More healthful dietary pattern, with some room for improvement.  >60 Healthy dietary pattern, although there may be some specific behaviors that could be improved.    Nutrition Goals Re-Evaluation:  Nutrition Goals Re-Evaluation     Row Name 06/09/22 1016 07/11/22 0953           Goals   Current Weight 164 lb 14.5 oz (74.8 kg) 159 lb 6.3 oz (72.3 kg)      Comment GFR 39, Cr 1.8, lipids WNL, A1c WNL  no new labs; most recent labs  GFR 39, Cr 1.8, lipids WNL, A1c WNL      Expected Outcome Quinzell will benefit form participation in intensive cardiac rehab for nutrition, exercise, and lifestyle modification. Goals in action. Slaten continues to attend the [employer] and nutrition series regularly. He has started making many dietary changes including reduced saturated fat and increased dietary fiber. He is down 5.5# since starting with our program; he is motivated to maintain weight. We have disucssed multiple strategies for increasing calories including increasing eating frequency, increasing carbohdyrate/protein/fat intake, etc. Xen will benefit form participation in intensive cardiac rehab for nutrition, exercise, and lifestyle modification.               Nutrition Goals Re-Evaluation:  Nutrition Goals Re-Evaluation     Row Name 06/09/22 1016 07/11/22 0953           Goals   Current Weight 164 lb 14.5 oz (74.8 kg) 159 lb 6.3 oz (72.3 kg)      Comment GFR 39, Cr 1.8, lipids WNL, A1c WNL no new labs; most recent labs  GFR 39, Cr 1.8, lipids WNL, A1c WNL      Expected Outcome Miracle will benefit form participation in intensive  cardiac rehab for nutrition, exercise, and lifestyle modification. Goals in action. Keo continues to attend the [employer] and nutrition series regularly. He has started making many dietary changes including reduced saturated fat and increased dietary fiber. He is down 5.5# since starting with our program; he is motivated to maintain weight. We have disucssed multiple strategies for increasing calories including increasing eating frequency, increasing carbohdyrate/protein/fat intake, etc. Courtenay will benefit form participation in intensive cardiac rehab for nutrition, exercise, and lifestyle modification.               Nutrition Goals Discharge (Final Nutrition Goals Re-Evaluation):  Nutrition Goals Re-Evaluation - 07/11/22 0953       Goals   Current Weight 159 lb 6.3 oz (72.3 kg)    Comment no new labs; most recent labs  GFR 39, Cr 1.8, lipids WNL, A1c WNL    Expected Outcome Goals in action. Mattson continues to attend the [employer] and nutrition series regularly. He has started making many dietary changes including reduced saturated fat and increased dietary fiber. He is down 5.5# since starting with our program; he is motivated to maintain weight. We have disucssed multiple strategies for increasing calories including increasing eating frequency, increasing carbohdyrate/protein/fat intake, etc. Hoyt will benefit form participation in intensive cardiac rehab for nutrition, exercise, and lifestyle modification.             Psychosocial: Target Goals: Acknowledge presence or absence of significant depression and/or stress, maximize coping skills, provide positive support system. Participant is able to verbalize types and ability to use techniques and skills needed for reducing stress and depression.  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 Has not returned to work yet. Pt declines counseling at this time, but voices he knows he can if needed.      Family Dynamics   Good Support System? Yes    Comments Wife and son are pt's support      Barriers   Psychosocial barriers to participate in  program The patient should benefit from training in stress management and relaxation.      Screening Interventions   Interventions Encouraged to exercise    Expected Outcomes Short Term goal: Identification and review with participant of any Quality of Life or Depression concerns found by scoring the questionnaire.;Long Term goal: The participant improves quality of Life and PHQ9 Scores as seen by post scores and/or verbalization of changes;Long Term Goal: Stressors or current issues are controlled or eliminated.             Quality of Life Scores:  Quality of Life - 06/03/22 1248       Quality of Life   Select Quality of Life      Quality of Life Scores   Health/Function Pre 12.43 %    Socioeconomic Pre 19.75 %    Psych/Spiritual Pre 23.57 %    Family Pre 16.9 %    GLOBAL Pre 16.97 %            Scores of 19 and below usually indicate a poorer quality of life in these areas.  A difference of  2-3 points is a clinically meaningful difference.  A difference of 2-3 points in the total score of the Quality of Life Index has been associated with significant improvement in overall quality of life, self-image, physical symptoms, and general health in studies assessing change in quality of life.  PHQ-9: Review Flowsheet  More data exists      06/03/2022 02/16/2020 08/22/2019 04/04/2019 03/08/2019  Depression screen PHQ 2/9  Decreased Interest 1 0 0 0 0  Down, Depressed, Hopeless 2 0 0 0 0  PHQ - 2 Score 3 0 0 0 0  Altered sleeping 1 - - - -  Tired, decreased energy 1 - - - -  Change in appetite 0 - - - -  Feeling bad or failure about  yourself  2 - - - -  Trouble concentrating 1 - - - -  Moving slowly or fidgety/restless 2 - - - -  Suicidal thoughts 0 - - - -  PHQ-9 Score 10 - - - -  Difficult doing work/chores Not difficult at all - - - -   Interpretation of Total Score  Total Score Depression Severity:  1-4 = Minimal depression, 5-9 = Mild depression, 10-14 = Moderate depression, 15-19 = Moderately severe depression, 20-27 = Severe depression   Psychosocial Evaluation and Intervention:   Psychosocial Re-Evaluation:  Psychosocial Re-Evaluation     Row Name 06/09/22 1327 07/01/22 1032 07/29/22 1705         Psychosocial Re-Evaluation   Current issues with Current Stress Concerns;Current Depression Current Stress Concerns;Current Depression Current Stress Concerns;Current Depression     Comments Reviewed quality of life questionnaire. Drayke admits to being depressed he feels his heart attack happened becuase he did not follow the doctor's reccomendation and went back to work too soon. Sevyn is returning back to work tomorrow with reduced hours. Patient's PCP notified. Karloz syas he is interesed in counselling Bernhard has not voiced nay concerns or stressors during exercise with [facility]. Andy told me that his wife was recently in the hospital and is recovering at home now     Expected Outcomes Elvie will have decreased depression, stress concerns upon completion of intensive cardiac rehab. Bohan will have decreased depression, stress concerns upon completion of intensive cardiac rehab. Robbie will have decreased depression, stress concerns upon completion of intensive cardiac rehab.     Interventions Physician referral;Encouraged to  attend Cardiac Rehabilitation for the exercise;Stress management education Physician referral;Encouraged to attend Cardiac Rehabilitation for the exercise;Stress management education Physician referral;Encouraged to attend Cardiac Rehabilitation for the exercise;Stress management education      Continue Psychosocial Services  Follow up required by staff No Follow up required No Follow up required       Initial Review   Source of Stress Concerns Chronic Illness;Occupation Chronic Illness;Occupation Chronic Illness;Occupation     Comments Will continue to monitor and offer support as needed Will continue to monitor and offer support as needed Will continue to monitor and offer support as needed              Psychosocial Discharge (Final Psychosocial Re-Evaluation):  Psychosocial Re-Evaluation - 07/29/22 1705       Psychosocial Re-Evaluation   Current issues with Current Stress Concerns;Current Depression    Comments Samvel told me that his wife was recently in the hospital and is recovering at home now    Expected Outcomes Aitan will have decreased depression, stress concerns upon completion of intensive cardiac rehab.    Interventions Physician referral;Encouraged to attend Cardiac Rehabilitation for the exercise;Stress management education    Continue Psychosocial Services  No Follow up required      Initial Review   Source of Stress Concerns Chronic Illness;Occupation    Comments Will continue to monitor and offer support as needed             Vocational Rehabilitation: Provide vocational rehab assistance to qualifying candidates.   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            Education: Education Goals: Education classes will be provided on a weekly basis, covering required topics. Participant will state understanding/return demonstration of topics presented.    Education     Row Name 06/09/22 0900     Education   Cardiac Education Topics Pritikin   Glass blower/designer Nutrition    Instruction Review Code 1- Verbalizes Understanding   Class Start Time 0815   Class Stop Time 0858   Class Time Calculation (min) 43 min    Row Name 06/11/22 0900     Education   Cardiac Education Topics Pritikin   Secondary school teacher School   Educator Dietitian   Weekly Topic Simple Sides and Sauces   Instruction Review Code 1- Verbalizes Understanding   Class Start Time 0815   Class Stop Time 0850   Class Time Calculation (min) 35 min    Row Name 06/13/22 0900     Education   Cardiac Education Topics Pritikin   Select Core Videos     Core Videos   Educator Exercise Physiologist   Select Psychosocial   Psychosocial How Our Thoughts Can Heal Our Hearts   Instruction Review Code 1- Verbalizes Understanding   Class Start Time 0815   Class Stop Time 0855   Class Time Calculation (min) 40 min    Row Name 06/16/22 1000     Education   Cardiac Education Topics Pritikin   Geographical information systems officer Psychosocial   Psychosocial Workshop From Head to Heart:  The Power of a Healthy Outlook   Instruction Review Code 1- Verbalizes Understanding   Class Start Time 0815   Class Stop Time 0903   Class Time Calculation (min) 48 min    Row Name 06/18/22 0900     Education   Cardiac Education Topics Pritikin   Secondary school teacher School   Educator Dietitian   Weekly Topic Powerhouse Plant-Based Proteins   Instruction Review Code 1- Verbalizes Understanding   Class Start Time [phone]   Class Stop Time 0845   Class Time Calculation (min) 35 min    Row Name 06/20/22 0800     Education   Cardiac Education Topics Pritikin   Select Core Videos     Core Videos   Educator Exercise Physiologist   Select General Education   General Education Hypertension and Heart Disease   Instruction Review Code 1- Verbalizes Understanding   Class Start Time [phone]   Class Stop Time 0843   Class Time Calculation (min)  31 min    Row Name 06/25/22 1100     Education   Cardiac Education Topics Pritikin   Secondary school teacher School   Educator Dietitian   Weekly Topic Adding Flavor - Sodium-Free   Instruction Review Code 1- Verbalizes Understanding   Class Start Time [phone]   Class Stop Time 0845   Class Time Calculation (min) 33 min    Row Name 06/27/22 1400     Education   Cardiac Education Topics Pritikin   Psychologist, forensic General Education   General Education Heart Disease Risk Reduction   Instruction Review Code 1- Verbalizes Understanding   Class Start Time 0810   Class Stop Time 0849   Class Time Calculation (min) 39 min    Row Name 06/30/22 0900     Education   Cardiac Education Topics Pritikin   Select Workshops     Workshops   Educator Exercise Physiologist   Select Exercise   Exercise Workshop Location manager and Fall Prevention   Instruction Review Code 1- Verbalizes Understanding   Class Start Time [phone]   Class Stop Time 0855   Class Time Calculation (min) 36 min    Row Name 07/02/22 0900     Education   Cardiac Education Topics Pritikin   Secondary school teacher School   Educator Dietitian   Weekly Topic Fast and Healthy Breakfasts   Instruction Review Code 1- Verbalizes Understanding   Class Start Time 0815   Class Stop Time 0848   Class Time Calculation (min) 33 min    Row Name 07/07/22 1100     Education   Cardiac Education Topics Pritikin   Select Workshops     Workshops   Educator Exercise Physiologist   Select Psychosocial   Psychosocial Workshop Recognizing and Reducing Stress   Instruction Review Code 1- Verbalizes Understanding   Class Start Time [phone]   Class Stop Time 0847   Class Time Calculation (min) 35 min    Row Name 07/09/22 1000     Education   Cardiac Education Topics Pritikin   Orthoptist   Educator Dietitian    Weekly Topic Personalizing Your Pritikin Plate   Instruction Review Code 1- Verbalizes Understanding   Class Start Time 0815   Class Stop Time [phone]  Class Time Calculation (min) 37 min    Row Name 07/11/22 0900     Education   Cardiac Education Topics Pritikin   Select Core Videos     Core Videos   Educator Exercise Physiologist   Select Psychosocial   Psychosocial Healthy Minds, Bodies, Hearts   Instruction Review Code 1- Verbalizes Understanding   Class Start Time 0815   Class Stop Time 0850   Class Time Calculation (min) 35 min    Row Name 07/14/22 0900     Education   Cardiac Education Topics Pritikin   Glass blower/designer Nutrition   Nutrition Workshop Label Reading   Instruction Review Code 1- Verbalizes Understanding   Class Start Time 0815   Class Stop Time 0858   Class Time Calculation (min) 43 min    Row Name 07/18/22 0900     Education   Cardiac Education Topics Pritikin   Select Core Videos     Core Videos   Educator Dietitian   Select Nutrition   Nutrition Other  label reading   Instruction Review Code 1- Verbalizes Understanding   Class Start Time 0810   Class Stop Time 0852   Class Time Calculation (min) 42 min    Row Name 07/21/22 0900     Education   Cardiac Education Topics Pritikin   Select Workshops     Workshops   Educator Exercise Physiologist   Select Exercise   Exercise Workshop Exercise Basics: Building Your Action Plan   Instruction Review Code 1- Verbalizes Understanding   Class Start Time 0808   Class Stop Time 0854   Class Time Calculation (min) 46 min    Row Name 07/23/22 0900     Education   Cardiac Education Topics Pritikin   Secondary school teacher School   Educator Dietitian   Weekly Topic Tasty Appetizers and Snacks   Instruction Review Code 1- Verbalizes Understanding   Class Start Time [phone]   Class Stop Time 0845   Class Time Calculation (min) 31  min    Row Name 07/25/22 0800     Education   Cardiac Education Topics Pritikin   Select Core Videos     Core Videos   Educator Exercise Physiologist   Select Nutrition   Nutrition Nutrition Action Plan   Instruction Review Code 1- Verbalizes Understanding   Class Start Time 0813   Class Stop Time 0843   Class Time Calculation (min) 30 min    Row Name 07/28/22 0900     Education   Cardiac Education Topics Pritikin   Select Core Videos     Core Videos   Educator Dietitian   Select Nutrition   Nutrition Calorie Density   Instruction Review Code 1- Verbalizes Understanding   Class Start Time 0815   Class Stop Time 0857   Class Time Calculation (min) 42 min            Core Videos: Exercise    Move It!  Clinical staff conducted group or individual video education with verbal and written material and guidebook.  Patient learns the recommended Pritikin exercise program. Exercise with the goal of living a long, healthy life. Some of the health benefits of exercise include controlled diabetes, healthier blood pressure levels, improved cholesterol levels, improved heart and lung capacity, improved sleep, and better body composition. Everyone should speak with their doctor before starting or changing an exercise routine.  Biomechanical Limitations Clinical staff conducted group or individual video education with verbal and written material and guidebook.  Patient learns how biomechanical limitations can impact exercise and how we can mitigate and possibly overcome limitations to have an impactful and balanced exercise routine.  Body Composition Clinical staff conducted group or individual video education with verbal and written material and guidebook.  Patient learns that body composition (ratio of muscle mass to fat mass) is a key component to assessing overall fitness, rather than body weight alone. Increased fat mass, especially visceral belly fat, can put [place] at increased  risk for metabolic syndrome, type 2 diabetes, heart disease, and even death. It is recommended to combine diet and exercise (cardiovascular and resistance training) to improve your body composition. Seek guidance from your physician and exercise physiologist before implementing an exercise routine.  Exercise Action Plan Clinical staff conducted group or individual video education with verbal and written material and guidebook.  Patient learns the recommended strategies to achieve and enjoy long-term exercise adherence, including variety, self-motivation, self-efficacy, and positive decision making. Benefits of exercise include fitness, good health, weight management, more energy, better sleep, less stress, and overall well-being.  Medical   Heart Disease Risk Reduction Clinical staff conducted group or individual video education with verbal and written material and guidebook.  Patient learns our heart is our most vital organ as it circulates oxygen, nutrients, white blood cells, and hormones throughout the entire body, and carries waste away. Data supports a plant-based eating plan like the Pritikin Program for its effectiveness in slowing progression of and reversing heart disease. The video provides a number of recommendations to address heart disease.   Metabolic Syndrome and Belly Fat  Clinical staff conducted group or individual video education with verbal and written material and guidebook.  Patient learns what metabolic syndrome is, how it leads to heart disease, and how one can reverse it and keep it from coming back. You have metabolic syndrome if you have 3 of the following 5 criteria: abdominal obesity, high blood pressure, high triglycerides, low HDL cholesterol, and high blood sugar.  Hypertension and Heart Disease Clinical staff conducted group or individual video education with verbal and written material and guidebook.  Patient learns that high blood pressure, or hypertension, is  very common in the [place]. Hypertension is largely due to excessive salt intake, but other important risk factors include being overweight, physical inactivity, drinking too much alcohol, smoking, and not eating enough potassium from fruits and vegetables. High blood pressure is a leading risk factor for heart attack, stroke, congestive heart failure, dementia, kidney failure, and premature death. Long-term effects of excessive salt intake include stiffening of the arteries and thickening of heart muscle and organ damage. Recommendations include ways to reduce hypertension and the risk of heart disease.  Diseases of Our Time - Focusing on Diabetes Clinical staff conducted group or individual video education with verbal and written material and guidebook.  Patient learns why the best way to stop diseases of our time is prevention, through food and other lifestyle changes. Medicine (such as prescription pills and surgeries) is often only a Band-Aid on the problem, not a long-term solution. Most common diseases of our time include obesity, type 2 diabetes, hypertension, heart disease, and cancer. The Pritikin Program is recommended and has been proven to help reduce, reverse, and/or prevent the damaging effects of metabolic syndrome.  Nutrition   Overview of the Pritikin Eating Plan  Clinical staff conducted group or individual video education with  verbal and written material and guidebook.  Patient learns about the Pritikin Eating Plan for disease risk reduction. The Pritikin Eating Plan emphasizes a wide variety of unrefined, minimally-processed carbohydrates, like fruits, vegetables, whole grains, and legumes. Go, Caution, and Stop food choices are explained. Plant-based and lean animal proteins are emphasized. Rationale provided for low sodium intake for blood pressure control, low added sugars for blood sugar stabilization, and low added fats and oils for coronary artery disease risk reduction and  weight management.  Calorie Density  Clinical staff conducted group or individual video education with verbal and written material and guidebook.  Patient learns about calorie density and how it impacts the Pritikin Eating Plan. Knowing the characteristics of the food you choose will help you decide whether those foods will lead to weight gain or weight loss, and whether you want to consume more or less of them. Weight loss is usually a side effect of the Pritikin Eating Plan because of its focus on low calorie-dense foods.  Label Reading  Clinical staff conducted group or individual video education with verbal and written material and guidebook.  Patient learns about the Pritikin recommended label reading guidelines and corresponding recommendations regarding calorie density, added sugars, sodium content, and whole grains.  Dining Out - Part 1  Clinical staff conducted group or individual video education with verbal and written material and guidebook.  Patient learns that restaurant meals can be sabotaging because they can be so high in calories, fat, sodium, and/or sugar. Patient learns recommended strategies on how to positively address this and avoid unhealthy pitfalls.  Facts on Fats  Clinical staff conducted group or individual video education with verbal and written material and guidebook.  Patient learns that lifestyle modifications can be just as effective, if not more so, as many medications for lowering your risk of heart disease. A Pritikin lifestyle can help to reduce your risk of inflammation and atherosclerosis (cholesterol build-up, or plaque, in the artery walls). Lifestyle interventions such as dietary choices and physical activity address the cause of atherosclerosis. A review of the types of fats and their impact on blood cholesterol levels, along with dietary recommendations to reduce fat intake is also included.  Nutrition Action Plan  Clinical staff conducted group or  individual video education with verbal and written material and guidebook.  Patient learns how to incorporate Pritikin recommendations into their lifestyle. Recommendations include planning and keeping personal health goals in mind as an important part of their success.  Healthy Mind-Set    Healthy Minds, Bodies, Hearts  Clinical staff conducted group or individual video education with verbal and written material and guidebook.  Patient learns how to identify when they are stressed. Video will discuss the impact of that stress, as well as the many benefits of stress management. Patient will also be introduced to stress management techniques. The way we think, act, and feel has an impact on our hearts.  How Our Thoughts Can Heal Our Hearts  Clinical staff conducted group or individual video education with verbal and written material and guidebook.  Patient learns that negative thoughts can cause depression and anxiety. This can result in negative lifestyle behavior and serious health problems. Cognitive behavioral therapy is an effective method to help control our thoughts in order to change and improve our emotional outlook.  Additional Videos:  Exercise    Improving Performance  Clinical staff conducted group or individual video education with verbal and written material and guidebook.  Patient learns to use a non-linear  approach by alternating intensity levels and lengths of time spent exercising to help burn more calories and lose more body fat. Cardiovascular exercise helps improve heart health, metabolism, hormonal balance, blood sugar control, and recovery from fatigue. Resistance training improves strength, endurance, balance, coordination, reaction time, metabolism, and muscle mass. Flexibility exercise improves circulation, posture, and balance. Seek guidance from your physician and exercise physiologist before implementing an exercise routine and learn your capabilities and proper form for  all exercise.  Introduction to Yoga  Clinical staff conducted group or individual video education with verbal and written material and guidebook.  Patient learns about yoga, a discipline of the coming together of mind, breath, and body. The benefits of yoga include improved flexibility, improved range of motion, better posture and core strength, increased lung function, weight loss, and positive self-image. Yoga's heart health benefits include lowered blood pressure, healthier heart rate, decreased cholesterol and triglyceride levels, improved immune function, and reduced stress. Seek guidance from your physician and exercise physiologist before implementing an exercise routine and learn your capabilities and proper form for all exercise.  Medical   Aging: Enhancing Your Quality of Life  Clinical staff conducted group or individual video education with verbal and written material and guidebook.  Patient learns key strategies and recommendations to stay in good physical health and enhance quality of life, such as prevention strategies, having an advocate, securing a Health Care Proxy and Power of Attorney, and keeping a list of medications and system for tracking them. It also discusses how to avoid risk for bone loss.  Biology of Weight Control  Clinical staff conducted group or individual video education with verbal and written material and guidebook.  Patient learns that weight gain occurs because we consume more calories than we burn (eating more, moving less). Even if your body weight is normal, you may have higher ratios of fat compared to muscle mass. Too much body fat puts you at increased risk for cardiovascular disease, heart attack, stroke, type 2 diabetes, and obesity-related cancers. In addition to exercise, following the Pritikin Eating Plan can help reduce your risk.  Decoding Lab Results  Clinical staff conducted group or individual video education with verbal and written material and  guidebook.  Patient learns that lab test reflects one measurement whose values change over time and are influenced by many factors, including medication, stress, sleep, exercise, food, hydration, pre-existing medical conditions, and more. It is recommended to use the knowledge from this video to become more involved with your lab results and evaluate your numbers to speak with your doctor.   Diseases of Our Time - Overview  Clinical staff conducted group or individual video education with verbal and written material and guidebook.  Patient learns that according to the CDC, 50% to 70% of chronic diseases (such as obesity, type 2 diabetes, elevated lipids, hypertension, and heart disease) are avoidable through lifestyle improvements including healthier food choices, listening to satiety cues, and increased physical activity.  Sleep Disorders Clinical staff conducted group or individual video education with verbal and written material and guidebook.  Patient learns how good quality and duration of sleep are important to overall health and well-being. Patient also learns about sleep disorders and how they impact health along with recommendations to address them, including discussing with a physician.  Nutrition  Dining Out - Part 2 Clinical staff conducted group or individual video education with verbal and written material and guidebook.  Patient learns how to plan ahead and communicate in order to maximize  their dining experience in a healthy and nutritious manner. Included are recommended food choices based on the type of restaurant the patient is visiting.   Fueling a Banker conducted group or individual video education with verbal and written material and guidebook.  There is a strong connection between our food choices and our health. Diseases like obesity and type 2 diabetes are very prevalent and are in large-part due to lifestyle choices. The Pritikin Eating Plan  provides plenty of food and hunger-curbing satisfaction. It is easy to follow, affordable, and helps reduce health risks.  Menu Workshop  Clinical staff conducted group or individual video education with verbal and written material and guidebook.  Patient learns that restaurant meals can sabotage health goals because they are often packed with calories, fat, sodium, and sugar. Recommendations include strategies to plan ahead and to communicate with the manager, chef, or server to help order a healthier meal.  Planning Your Eating Strategy  Clinical staff conducted group or individual video education with verbal and written material and guidebook.  Patient learns about the Pritikin Eating Plan and its benefit of reducing the risk of disease. The Pritikin Eating Plan does not focus on calories. Instead, it emphasizes high-quality, nutrient-rich foods. By knowing the characteristics of the foods, we choose, we can determine their calorie density and make informed decisions.  Targeting Your Nutrition Priorities  Clinical staff conducted group or individual video education with verbal and written material and guidebook.  Patient learns that lifestyle habits have a tremendous impact on disease risk and progression. This video provides eating and physical activity recommendations based on your personal health goals, such as reducing LDL cholesterol, losing weight, preventing or controlling type 2 diabetes, and reducing high blood pressure.  Vitamins and Minerals  Clinical staff conducted group or individual video education with verbal and written material and guidebook.  Patient learns different ways to obtain key vitamins and minerals, including through a recommended healthy diet. It is important to discuss all supplements you take with your doctor.   Healthy Mind-Set    Smoking Cessation  Clinical staff conducted group or individual video education with verbal and written material and guidebook.   Patient learns that cigarette smoking and tobacco addiction pose a serious health risk which affects millions of people. Stopping smoking will significantly reduce the risk of heart disease, lung disease, and many forms of cancer. Recommended strategies for quitting are covered, including working with your doctor to develop a successful plan.  Culinary   Becoming a Set designer conducted group or individual video education with verbal and written material and guidebook.  Patient learns that cooking at home can be healthy, cost-effective, quick, and puts them in control. Keys to cooking healthy recipes will include looking at your recipe, assessing your equipment needs, planning ahead, making it simple, choosing cost-effective seasonal ingredients, and limiting the use of added fats, salts, and sugars.  Cooking - Breakfast and Snacks  Clinical staff conducted group or individual video education with verbal and written material and guidebook.  Patient learns how important breakfast is to satiety and nutrition through the entire day. Recommendations include key foods to eat during breakfast to help stabilize blood sugar levels and to prevent overeating at meals later in the day. Planning ahead is also a key component.  Cooking - Educational psychologist conducted group or individual video education with verbal and written material and guidebook.  Patient learns eating strategies to improve  overall health, including an approach to cook more at home. Recommendations include thinking of animal protein as a side on your plate rather than center stage and focusing instead on lower calorie dense options like vegetables, fruits, whole grains, and plant-based proteins, such as beans. Making sauces in large quantities to freeze for later and leaving the skin on your vegetables are also recommended to maximize your experience.  Cooking - Healthy Salads and Dressing Clinical staff  conducted group or individual video education with verbal and written material and guidebook.  Patient learns that vegetables, fruits, whole grains, and legumes are the foundations of the Pritikin Eating Plan. Recommendations include how to incorporate each of these in flavorful and healthy salads, and how to create homemade salad dressings. Proper handling of ingredients is also covered. Cooking - Soups and [employer] - Soups and Desserts Clinical staff conducted group or individual video education with verbal and written material and guidebook.  Patient learns that Pritikin soups and desserts make for easy, nutritious, and delicious snacks and meal components that are low in sodium, fat, sugar, and calorie density, while high in vitamins, minerals, and filling fiber. Recommendations include simple and healthy ideas for soups and desserts.   Overview     The Pritikin Solution Program Overview Clinical staff conducted group or individual video education with verbal and written material and guidebook.  Patient learns that the results of the Pritikin Program have been documented in more than 100 articles published in peer-reviewed journals, and the benefits include reducing risk factors for (and, in some cases, even reversing) high cholesterol, high blood pressure, type 2 diabetes, obesity, and more! An overview of the three key pillars of the Pritikin Program will be covered: eating well, doing regular exercise, and having a healthy mind-set.  WORKSHOPS  Exercise: Exercise Basics: Building Your Action Plan Clinical staff led group instruction and group discussion with PowerPoint presentation and patient guidebook. To enhance the learning environment the use of posters, models and videos may be added. At the conclusion of this workshop, patients will comprehend the difference between physical activity and exercise, as well as the benefits of incorporating both, into their routine. Patients will  understand the FITT (Frequency, Intensity, Time, and Type) principle and how to use it to build an exercise action plan. In addition, safety concerns and other considerations for exercise and cardiac rehab will be addressed by the presenter. The purpose of this lesson is to promote a comprehensive and effective weekly exercise routine in order to improve patients' overall level of fitness.   Managing Heart Disease: Your Path to a Healthier Heart Clinical staff led group instruction and group discussion with PowerPoint presentation and patient guidebook. To enhance the learning environment the use of posters, models and videos may be added.At the conclusion of this workshop, patients will understand the anatomy and physiology of the heart. Additionally, they will understand how Pritikin's three pillars impact the risk factors, the progression, and the management of heart disease.  The purpose of this lesson is to provide a high-level overview of the heart, heart disease, and how the Pritikin lifestyle positively impacts risk factors.  Exercise Biomechanics Clinical staff led group instruction and group discussion with PowerPoint presentation and patient guidebook. To enhance the learning environment the use of posters, models and videos may be added. Patients will learn how the structural parts of their bodies function and how these functions impact their daily activities, movement, and exercise. Patients will learn how to promote a neutral  spine, learn how to manage pain, and identify ways to improve their physical movement in order to promote healthy living. The purpose of this lesson is to expose patients to common physical limitations that impact physical activity. Participants will learn practical ways to adapt and manage aches and pains, and to minimize their effect on regular exercise. Patients will learn how to maintain good posture while sitting, walking, and lifting.  Balance Training  and Fall Prevention  Clinical staff led group instruction and group discussion with PowerPoint presentation and patient guidebook. To enhance the learning environment the use of posters, models and videos may be added. At the conclusion of this workshop, patients will understand the importance of their sensorimotor skills (vision, proprioception, and the vestibular system) in maintaining their ability to balance as they age. Patients will apply a variety of balancing exercises that are appropriate for their current level of function. Patients will understand the common causes for poor balance, possible solutions to these problems, and ways to modify their physical environment in order to minimize their fall risk. The purpose of this lesson is to teach patients about the importance of maintaining balance as they age and ways to minimize their risk of falling.  WORKSHOPS   Nutrition:  Fueling a Ship broker led group instruction and group discussion with PowerPoint presentation and patient guidebook. To enhance the learning environment the use of posters, models and videos may be added. Patients will review the foundational principles of the Pritikin Eating Plan and understand what constitutes a serving size in each of the food groups. Patients will also learn Pritikin-friendly foods that are better choices when away from home and review make-ahead meal and snack options. Calorie density will be reviewed and applied to three nutrition priorities: weight maintenance, weight loss, and weight gain. The purpose of this lesson is to reinforce (in a group setting) the key concepts around what patients are recommended to eat and how to apply these guidelines when away from home by planning and selecting Pritikin-friendly options. Patients will understand how calorie density may be adjusted for different weight management goals.  Mindful Eating  Clinical staff led group instruction and group  discussion with PowerPoint presentation and patient guidebook. To enhance the learning environment the use of posters, models and videos may be added. Patients will briefly review the concepts of the Pritikin Eating Plan and the importance of low-calorie dense foods. The concept of mindful eating will be introduced as well as the importance of paying attention to internal hunger signals. Triggers for non-hunger eating and techniques for dealing with triggers will be explored. The purpose of this lesson is to provide patients with the opportunity to review the basic principles of the Pritikin Eating Plan, discuss the value of eating mindfully and how to measure internal cues of hunger and fullness using the Hunger Scale. Patients will also discuss reasons for non-hunger eating and learn strategies to use for controlling emotional eating.  Targeting Your Nutrition Priorities Clinical staff led group instruction and group discussion with PowerPoint presentation and patient guidebook. To enhance the learning environment the use of posters, models and videos may be added. Patients will learn how to determine their genetic susceptibility to disease by reviewing their family history. Patients will gain insight into the importance of diet as part of an overall healthy lifestyle in mitigating the impact of genetics and other environmental insults. The purpose of this lesson is to provide patients with the opportunity to assess their personal nutrition priorities  by looking at their family history, their own health history and current risk factors. Patients will also be able to discuss ways of prioritizing and modifying the Pritikin Eating Plan for their highest risk areas  Menu  Clinical staff led group instruction and group discussion with PowerPoint presentation and patient guidebook. To enhance the learning environment the use of posters, models and videos may be added. Using menus brought in from [employer],  or printed from [employer], patients will apply the Pritikin dining out guidelines that were presented in the [employer] video. Patients will also be able to practice these guidelines in a variety of provided scenarios. The purpose of this lesson is to provide patients with the opportunity to practice hands-on learning of the Pritikin Dining Out guidelines with actual menus and practice scenarios.  Label Reading Clinical staff led group instruction and group discussion with PowerPoint presentation and patient guidebook. To enhance the learning environment the use of posters, models and videos may be added. Patients will review and discuss the Pritikin label reading guidelines presented in Pritikin's Label Reading Educational series video. Using fool labels brought in from local grocery stores and markets, patients will apply the label reading guidelines and determine if the packaged food meet the Pritikin guidelines. The purpose of this lesson is to provide patients with the opportunity to review, discuss, and practice hands-on learning of the Pritikin Label Reading guidelines with actual packaged food labels. Cooking School  Pritikin's [employer] are designed to teach patients ways to prepare quick, simple, and affordable recipes at home. The importance of nutrition's role in chronic disease risk reduction is reflected in its emphasis in the overall Pritikin program. By learning how to prepare essential core Pritikin Eating Plan recipes, patients will increase control over what they eat; be able to customize the flavor of foods without the use of added salt, sugar, or fat; and improve the quality of the food they consume. By learning a set of core recipes which are easily assembled, quickly prepared, and affordable, patients are more likely to prepare more healthy foods at home. These workshops focus on convenient breakfasts, simple entres, side dishes, and desserts which  can be prepared with minimal effort and are consistent with nutrition recommendations for cardiovascular risk reduction. Cooking [employer] are taught by a Armed forces logistics/support/administrative officer (RD) who has been trained by the [employer]. The chef or RD has a clear understanding of the importance of minimizing - if not completely eliminating - added fat, sugar, and sodium in recipes. Throughout the series of Cooking School Workshop sessions, patients will learn about healthy ingredients and efficient methods of cooking to build confidence in their capability to prepare    Cooking School weekly topics:  Adding Flavor- Sodium-Free  Fast and Healthy Breakfasts  Powerhouse Plant-Based Proteins  Satisfying Salads and Dressings  Simple Sides and Sauces  International Cuisine-Spotlight on the [employer] Zones  Delicious Desserts  Savory Soups  [employer] - Meals in a Astronomer Appetizers and Snacks  Comforting Weekend Breakfasts  One-Pot Wonders   Fast Evening Meals  Landscape architect Your Pritikin Plate  WORKSHOPS   Healthy Mindset (Psychosocial):  Focused Goals, Sustainable Changes Clinical staff led group instruction and group discussion with PowerPoint presentation and patient guidebook. To enhance the learning environment the use of posters, models and videos may be added. Patients will be able to apply effective goal setting strategies to establish at least one personal  goal, and then take consistent, meaningful action toward that goal. They will learn to identify common barriers to achieving personal goals and develop strategies to overcome them. Patients will also gain an understanding of how our mind-set can impact our ability to achieve goals and the importance of cultivating a positive and growth-oriented mind-set. The purpose of this lesson is to provide patients with a deeper understanding of how to set and achieve personal goals, as well as the tools  and strategies needed to overcome common obstacles which may arise along the way.  From Head to Heart: The Power of a Healthy Outlook  Clinical staff led group instruction and group discussion with PowerPoint presentation and patient guidebook. To enhance the learning environment the use of posters, models and videos may be added. Patients will be able to recognize and describe the impact of emotions and mood on physical health. They will discover the importance of self-care and explore self-care practices which may work for them. Patients will also learn how to utilize the 4 C's to cultivate a healthier outlook and better manage stress and challenges. The purpose of this lesson is to demonstrate to patients how a healthy outlook is an essential part of maintaining good health, especially as they continue their cardiac rehab journey.  Healthy Sleep for a Healthy Heart Clinical staff led group instruction and group discussion with PowerPoint presentation and patient guidebook. To enhance the learning environment the use of posters, models and videos may be added. At the conclusion of this workshop, patients will be able to demonstrate knowledge of the importance of sleep to overall health, well-being, and quality of life. They will understand the symptoms of, and treatments for, common sleep disorders. Patients will also be able to identify daytime and nighttime behaviors which impact sleep, and they will be able to apply these tools to help manage sleep-related challenges. The purpose of this lesson is to provide patients with a general overview of sleep and outline the importance of quality sleep. Patients will learn about a few of the most common sleep disorders. Patients will also be introduced to the concept of "sleep hygiene," and discover ways to self-manage certain sleeping problems through simple daily behavior changes. Finally, the workshop will motivate patients by clarifying the links between  quality sleep and their goals of heart-healthy living.   Recognizing and Reducing Stress Clinical staff led group instruction and group discussion with PowerPoint presentation and patient guidebook. To enhance the learning environment the use of posters, models and videos may be added. At the conclusion of this workshop, patients will be able to understand the types of stress reactions, differentiate between acute and chronic stress, and recognize the impact that chronic stress has on their health. They will also be able to apply different coping mechanisms, such as reframing negative self-talk. Patients will have the opportunity to practice a variety of stress management techniques, such as deep abdominal breathing, progressive muscle relaxation, and/or guided imagery.  The purpose of this lesson is to educate patients on the role of stress in their lives and to provide healthy techniques for coping with it.  Learning Barriers/Preferences:  Learning Barriers/Preferences - 06/03/22 1003       Learning Barriers/Preferences   Learning Barriers Sight    Learning Preferences Audio;Computer/Internet;Pictoral;Video;Verbal Instruction             Education Topics:  Knowledge Questionnaire Score:  Knowledge Questionnaire Score - 06/03/22 1248       Knowledge Questionnaire Score   Pre  Score 20/24             Core Components/Risk Factors/Patient Goals at Admission:  Personal Goals and Risk Factors at Admission - 06/03/22 1236       Core Components/Risk Factors/Patient Goals on Admission   Hypertension Yes    Intervention Provide education on lifestyle modifcations including regular physical activity/exercise, weight management, moderate sodium restriction and increased consumption of fresh fruit, vegetables, and low fat dairy, alcohol moderation, and smoking cessation.;Monitor prescription use compliance.    Expected Outcomes Short Term: Continued assessment and intervention until BP  is < 140/25mm HG in hypertensive participants. < 130/18mm HG in hypertensive participants with diabetes, heart failure or chronic kidney disease.;Long Term: Maintenance of blood pressure at goal levels.    Lipids Yes    Intervention Provide education and support for participant on nutrition & aerobic/resistive exercise along with prescribed medications to achieve LDL 70mg , HDL >40mg .    Expected Outcomes Short Term: Participant states understanding of desired cholesterol values and is compliant with medications prescribed. Participant is following exercise prescription and nutrition guidelines.;Long Term: Cholesterol controlled with medications as prescribed, with individualized exercise RX and with personalized nutrition plan. Value goals: LDL < 70mg , HDL > 40 mg.    Stress Yes    Intervention Offer individual and/or small group education and counseling on adjustment to heart disease, stress management and health-related lifestyle change. Teach and support self-help strategies.;Refer participants experiencing significant psychosocial distress to appropriate mental health specialists for further evaluation and treatment. When possible, include family members and significant others in education/counseling sessions.    Expected Outcomes Short Term: Participant demonstrates changes in health-related behavior, relaxation and other stress management skills, ability to obtain effective social support, and compliance with psychotropic medications if prescribed.;Long Term: Emotional wellbeing is indicated by absence of clinically significant psychosocial distress or social isolation.             Core Components/Risk Factors/Patient Goals Review:   Goals and Risk Factor Review     Row Name 06/09/22 1334 07/01/22 1034 07/29/22 1708         Core Components/Risk Factors/Patient Goals Review   Personal Goals Review Weight Management/Obesity;Stress;Hypertension;Lipids Weight  Management/Obesity;Stress;Hypertension;Lipids Weight Management/Obesity;Stress;Hypertension;Lipids     Review Russell started intensive cardiac rehab on 06/09/22. VSS. Mascud did well with exercise. Audwin is doing well with exercise. Stated chest pain is musculoskeletal per cardiologist and not cardiac-related. Stated he is breathing better. VSS. Dasan is doing well with exercise. VSS. Caymon has lost 2.7 kg since starting cardiac rehab. Jed will complete intensive cardiac rehab on 08/15/22     Expected Outcomes Makenzie will continue to participate in intensive cardiac rehab for exercise, nutrition and lifestyle modifications [place] will continue to participate in intensive cardiac rehab for exercise, nutrition and lifestyle modifications Jacobanthony will continue to participate in intensive cardiac rehab for exercise, nutrition and lifestyle modifications              Core Components/Risk Factors/Patient Goals at Discharge (Final Review):   Goals and Risk Factor Review - 07/29/22 1708       Core Components/Risk Factors/Patient Goals Review   Personal Goals Review Weight Management/Obesity;Stress;Hypertension;Lipids    Review Dahlton is doing well with exercise. VSS. Torien has lost 2.7 kg since starting cardiac rehab. Amon will complete intensive cardiac rehab on 08/15/22    Expected Outcomes Vedant will continue to participate in intensive cardiac rehab for exercise, nutrition and lifestyle modifications  ITP Comments:  ITP Comments     Row Name 06/03/22 0810 06/09/22 1326 07/01/22 1031 07/29/22 1704     ITP Comments Armanda Magic, MD:  Medical Director. Introduction to the Pritikin education program / Intensive cardiac rehab.  Initial orientation packet reviewed with the patient. 30 Day ITP Review. Geramiah started intensive cardiac rehab on 06/09/22 and did well with exercise 30 Day ITP Review. Olon started intensive cardiac rehab on 06/09/22 and did well with exercise 30  Day ITP Review. Kaden has good attendance and participation at intensive cardiac rehab.Harshith will complete intensive cardiac rehab on 08/15/22             Comments: See ITP comments:

## 2022-07-30 ENCOUNTER — Encounter (HOSPITAL_COMMUNITY)
Admission: RE | Admit: 2022-07-30 | Discharge: 2022-07-30 | Disposition: A | Discharge status: Home / Self Care | Payer: Medicare Other | Source: Ambulatory Visit | Attending: Cardiology | Admitting: Cardiology

## 2022-07-30 DIAGNOSIS — I214 Non-ST elevation (NSTEMI) myocardial infarction: Secondary | ICD-10-CM

## 2022-07-30 DIAGNOSIS — Z48812 Encounter for surgical aftercare following surgery on the circulatory system: Secondary | ICD-10-CM | POA: Diagnosis not present

## 2022-07-30 DIAGNOSIS — Z955 Presence of coronary angioplasty implant and graft: Secondary | ICD-10-CM

## 2022-07-30 DIAGNOSIS — Z952 Presence of prosthetic heart valve: Secondary | ICD-10-CM

## 2022-08-01 ENCOUNTER — Encounter (HOSPITAL_COMMUNITY): Payer: Medicare Other

## 2022-08-04 ENCOUNTER — Encounter (HOSPITAL_COMMUNITY)
Admission: RE | Admit: 2022-08-04 | Discharge: 2022-08-04 | Disposition: A | Discharge status: Home / Self Care | Payer: Medicare Other | Source: Ambulatory Visit | Attending: Cardiology | Admitting: Cardiology

## 2022-08-04 DIAGNOSIS — I214 Non-ST elevation (NSTEMI) myocardial infarction: Secondary | ICD-10-CM

## 2022-08-04 DIAGNOSIS — Z955 Presence of coronary angioplasty implant and graft: Secondary | ICD-10-CM

## 2022-08-04 DIAGNOSIS — Z952 Presence of prosthetic heart valve: Secondary | ICD-10-CM

## 2022-08-04 DIAGNOSIS — Z48812 Encounter for surgical aftercare following surgery on the circulatory system: Secondary | ICD-10-CM | POA: Diagnosis not present

## 2022-08-06 ENCOUNTER — Encounter (HOSPITAL_COMMUNITY)
Admission: RE | Admit: 2022-08-06 | Discharge: 2022-08-06 | Disposition: A | Discharge status: Home / Self Care | Payer: Medicare Other | Source: Ambulatory Visit | Attending: Cardiology | Admitting: Cardiology

## 2022-08-06 DIAGNOSIS — I214 Non-ST elevation (NSTEMI) myocardial infarction: Secondary | ICD-10-CM

## 2022-08-06 DIAGNOSIS — Z952 Presence of prosthetic heart valve: Secondary | ICD-10-CM

## 2022-08-06 DIAGNOSIS — Z955 Presence of coronary angioplasty implant and graft: Secondary | ICD-10-CM

## 2022-08-06 DIAGNOSIS — Z48812 Encounter for surgical aftercare following surgery on the circulatory system: Secondary | ICD-10-CM | POA: Diagnosis not present

## 2022-08-08 ENCOUNTER — Encounter (HOSPITAL_COMMUNITY)
Admission: RE | Admit: 2022-08-08 | Discharge: 2022-08-08 | Disposition: A | Discharge status: Home / Self Care | Payer: Medicare Other | Source: Ambulatory Visit | Attending: Cardiology | Admitting: Cardiology

## 2022-08-08 DIAGNOSIS — I214 Non-ST elevation (NSTEMI) myocardial infarction: Secondary | ICD-10-CM

## 2022-08-08 DIAGNOSIS — Z955 Presence of coronary angioplasty implant and graft: Secondary | ICD-10-CM

## 2022-08-08 DIAGNOSIS — Z952 Presence of prosthetic heart valve: Secondary | ICD-10-CM

## 2022-08-08 DIAGNOSIS — Z48812 Encounter for surgical aftercare following surgery on the circulatory system: Secondary | ICD-10-CM | POA: Diagnosis not present

## 2022-08-11 ENCOUNTER — Encounter (HOSPITAL_COMMUNITY)
Admission: RE | Admit: 2022-08-11 | Discharge: 2022-08-11 | Disposition: A | Discharge status: Home / Self Care | Payer: Medicare Other | Source: Ambulatory Visit | Attending: Cardiology | Admitting: Cardiology

## 2022-08-11 DIAGNOSIS — Z48812 Encounter for surgical aftercare following surgery on the circulatory system: Secondary | ICD-10-CM | POA: Diagnosis not present

## 2022-08-11 DIAGNOSIS — Z952 Presence of prosthetic heart valve: Secondary | ICD-10-CM

## 2022-08-11 DIAGNOSIS — I214 Non-ST elevation (NSTEMI) myocardial infarction: Secondary | ICD-10-CM

## 2022-08-11 DIAGNOSIS — Z955 Presence of coronary angioplasty implant and graft: Secondary | ICD-10-CM

## 2022-08-13 ENCOUNTER — Encounter (HOSPITAL_COMMUNITY)
Admission: RE | Admit: 2022-08-13 | Discharge: 2022-08-13 | Disposition: A | Discharge status: Home / Self Care | Payer: Medicare Other | Source: Ambulatory Visit | Attending: Cardiology | Admitting: Cardiology

## 2022-08-13 DIAGNOSIS — Z48812 Encounter for surgical aftercare following surgery on the circulatory system: Secondary | ICD-10-CM | POA: Diagnosis not present

## 2022-08-13 DIAGNOSIS — Z955 Presence of coronary angioplasty implant and graft: Secondary | ICD-10-CM

## 2022-08-13 DIAGNOSIS — I214 Non-ST elevation (NSTEMI) myocardial infarction: Secondary | ICD-10-CM

## 2022-08-13 DIAGNOSIS — Z952 Presence of prosthetic heart valve: Secondary | ICD-10-CM

## 2022-08-15 ENCOUNTER — Encounter (HOSPITAL_COMMUNITY): Payer: Medicare Other

## 2022-08-18 ENCOUNTER — Encounter (HOSPITAL_COMMUNITY): Payer: Medicare Other

## 2022-08-20 ENCOUNTER — Encounter (HOSPITAL_COMMUNITY)
Admission: RE | Admit: 2022-08-20 | Discharge: 2022-08-20 | Disposition: A | Discharge status: Home / Self Care | Payer: Medicare Other | Source: Ambulatory Visit | Attending: Cardiology | Admitting: Cardiology

## 2022-08-20 DIAGNOSIS — Z48812 Encounter for surgical aftercare following surgery on the circulatory system: Secondary | ICD-10-CM | POA: Diagnosis not present

## 2022-08-20 DIAGNOSIS — I214 Non-ST elevation (NSTEMI) myocardial infarction: Secondary | ICD-10-CM

## 2022-08-20 DIAGNOSIS — Z955 Presence of coronary angioplasty implant and graft: Secondary | ICD-10-CM

## 2022-08-20 DIAGNOSIS — Z952 Presence of prosthetic heart valve: Secondary | ICD-10-CM

## 2022-08-22 ENCOUNTER — Encounter (HOSPITAL_COMMUNITY)
Admission: RE | Admit: 2022-08-22 | Discharge: 2022-08-22 | Disposition: A | Discharge status: Home / Self Care | Payer: Medicare Other | Source: Ambulatory Visit | Attending: Cardiology | Admitting: Cardiology

## 2022-08-22 VITALS — Ht 68.0 in | Wt 163.1 lb

## 2022-08-22 DIAGNOSIS — Z48812 Encounter for surgical aftercare following surgery on the circulatory system: Secondary | ICD-10-CM | POA: Diagnosis not present

## 2022-08-22 DIAGNOSIS — I214 Non-ST elevation (NSTEMI) myocardial infarction: Secondary | ICD-10-CM

## 2022-08-22 DIAGNOSIS — Z952 Presence of prosthetic heart valve: Secondary | ICD-10-CM

## 2022-08-22 DIAGNOSIS — Z955 Presence of coronary angioplasty implant and graft: Secondary | ICD-10-CM

## 2022-08-25 ENCOUNTER — Encounter (HOSPITAL_COMMUNITY): Payer: Medicare Other

## 2022-08-27 ENCOUNTER — Encounter (HOSPITAL_COMMUNITY)
Admission: RE | Admit: 2022-08-27 | Discharge: 2022-08-27 | Disposition: A | Discharge status: Home / Self Care | Payer: Medicare Other | Source: Ambulatory Visit | Attending: Cardiology | Admitting: Cardiology

## 2022-08-27 DIAGNOSIS — Z48812 Encounter for surgical aftercare following surgery on the circulatory system: Secondary | ICD-10-CM | POA: Diagnosis not present

## 2022-08-27 DIAGNOSIS — Z955 Presence of coronary angioplasty implant and graft: Secondary | ICD-10-CM

## 2022-08-27 DIAGNOSIS — I214 Non-ST elevation (NSTEMI) myocardial infarction: Secondary | ICD-10-CM

## 2022-08-27 DIAGNOSIS — Z952 Presence of prosthetic heart valve: Secondary | ICD-10-CM

## 2022-08-27 NOTE — Progress Notes (Signed)
Discharge Progress Report  Patient Details  Name: Robert Estes MRN: [id number] Date of Birth: 1945-05-09 Referring Provider:   Flowsheet Row INTENSIVE CARDIAC REHAB ORIENT from 06/03/2022 in [facility] Referring Provider Dr. Keitha Butte, MD covering)     Number of Visits: 53  Reason for Discharge:  Patient reached a stable level of exercise. Patient independent in their exercise. Patient has met program and personal goals.  Smoking History:  Social History  Tobacco Use Smoking Status Never Smokeless Tobacco Never   Diagnosis:  05/10/22 NSTEMI at [facility]  05/10/22 S/P DES x 3 at [facility]  04/16/22 S/P TAVR at  [facility]  ADL [facility]:   Initial Exercise Prescription: Initial Exercise Prescription - 06/03/22 0900  Date of Initial Exercise RX and Referring Provider Date 06/03/22  Referring Provider Dr. Keitha Butte, MD covering)  Expected Discharge Date 08/15/22  NuStep Level 2  SPM 80  Minutes 15  METs 3.5  Arm Ergometer Level 1.5  Watts 25  RPM 60  Minutes 15  METs 3.5  Prescription Details Frequency (times per week) 3  Duration Progress to 30 minutes of continuous aerobic without signs/symptoms of physical distress  Intensity THRR 40-80% of Max Heartrate 58-117  Ratings of Perceived Exertion 11-13  Perceived Dyspnea 0-4  Progression Progression Continue progressive overload as per policy without signs/symptoms or physical distress.  Resistance Training Training Prescription Yes  Weight 3 lbs  Reps 10-15     Discharge Exercise Prescription (Final Exercise Prescription Changes): Exercise Prescription Changes - 08/08/22 1600  Response to Exercise Blood Pressure (Admit) 124/68  Blood Pressure (Exercise) 146/78  Blood Pressure (Exit) 106/56  Heart Rate (Admit) 59 bpm  Heart Rate (Exercise) 92 bpm  Heart Rate (Exit) 69 bpm  Rating  of Perceived Exertion (Exercise) 13  Symptoms None  Comments Reviewed METs and goals  Duration Continue with 30 min of aerobic exercise without signs/symptoms of physical distress.  Intensity THRR unchanged  Progression Progression Continue to progress workloads to maintain intensity without signs/symptoms of physical distress.  Average METs 3.4  Resistance Training Training Prescription Yes  Weight 4 lbs  Reps 10-15  Time 10 Minutes  Interval Training Interval Training No  NuStep Level 4  SPM 94  Minutes 15  METs 3.8  Arm Ergometer Level 1.5  Watts 18  RPM 56  Minutes 15  METs 2.3  Home Exercise Plan Plans to continue exercise at Home (comment)  Frequency Add 2 additional days to program exercise sessions.  Initial Home Exercises Provided 07/09/22     Functional Capacity: 6 Minute Walk  Row Name 06/03/22 0919 08/20/22 0910 6 Minute Walk Phase Initial Discharge Distance 1583 feet 1934 feet Distance % Change -- 22.17 % Distance Feet Change -- 351 ft Walk Time 6 minutes 6 minutes # of Rest Breaks 0 0 MPH 3 3.7 METS 3.5 3.92 RPE 7 12.5 Perceived Dyspnea  0 0 VO2 Peak 12.1 13.93 Symptoms Yes (comment) Yes (comment) Comments Left great toe pain 2/10 (gout) Left shoulder pain, 2.5/10 Resting HR 73 bpm 62 bpm Resting BP 110/60 128/72 Resting Oxygen Saturation  98 % -- Exercise Oxygen Saturation  during 6 min walk 100 % -- Max Ex. HR 112 bpm 92 bpm Max Ex. BP 130/72 134/62 2 Minute Post BP 122/70 --    Psychological, QOL, Others - Outcomes: PHQ 2/9:   08/27/2022    9:45 AM 06/03/2022   11:00 AM 02/16/2020  9:16 AM 08/22/2019    9:17 AM 04/04/2019    3:21 PM Depression screen PHQ 2/9 Decreased Interest 0 1 0 0 0 Down, Depressed, Hopeless 0 2 0 0 0 PHQ - 2 Score 0 3 0 0 0 Altered sleeping 1 1 Tired, decreased energy 0 1 Change in appetite 0 0 Feeling bad or failure  about yourself  0 2 Trouble concentrating 0 1 Moving slowly or fidgety/restless 0 2 Suicidal thoughts 0 0 PHQ-9 Score 1 10 Difficult doing work/chores Not difficult at all Not difficult at all   Quality of Life: Quality of Life - 06/03/22 1248  Quality of Life Select Quality of Life  Quality of Life Scores Health/Function Pre 12.43 %  Socioeconomic Pre 19.75 %  Psych/Spiritual Pre 23.57 %  Family Pre 16.9 %  GLOBAL Pre 16.97 %     Personal Goals: Goals established at orientation with interventions provided to work toward goal. Personal Goals and Risk Factors at Admission - 06/03/22 1236  Core Components/Risk Factors/Patient Goals on Admission Hypertension Yes  Intervention Provide education on lifestyle modifcations including regular physical activity/exercise, weight management, moderate sodium restriction and increased consumption of fresh fruit, vegetables, and low fat dairy, alcohol moderation, and smoking cessation.;Monitor prescription use compliance.  Expected Outcomes Short Term: Continued assessment and intervention until BP is < 140/38mm HG in hypertensive participants. < 130/69mm HG in hypertensive participants with diabetes, heart failure or chronic kidney disease.;Long Term: Maintenance of blood pressure at goal levels.  Lipids Yes  Intervention Provide education and support for participant on nutrition & aerobic/resistive exercise along with prescribed medications to achieve LDL 70mg , HDL >40mg .  Expected Outcomes Short Term: Participant states understanding of desired cholesterol values and is compliant with medications prescribed. Participant is following exercise prescription and nutrition guidelines.;Long Term: Cholesterol controlled with medications as prescribed, with individualized exercise RX and with personalized nutrition plan. Value goals: LDL < 70mg , HDL > 40 mg.  Stress Yes  Intervention Offer individual and/or small group  education and counseling on adjustment to heart disease, stress management and health-related lifestyle change. Teach and support self-help strategies.;Refer participants experiencing significant psychosocial distress to appropriate mental health specialists for further evaluation and treatment. When possible, include family members and significant others in education/counseling sessions.  Expected Outcomes Short Term: Participant demonstrates changes in health-related behavior, relaxation and other stress management skills, ability to obtain effective social support, and compliance with psychotropic medications if prescribed.;Long Term: Emotional wellbeing is indicated by absence of clinically significant psychosocial distress or social isolation.      Personal Goals Discharge: Goals and Risk Factor Review  Row Name 06/09/22 1334 07/01/22 1034 07/29/22 1708 08/27/22 0855 Core Components/Risk Factors/Patient Goals Review Personal Goals Review Weight Management/Obesity;Stress;Hypertension;Lipids Weight Management/Obesity;Stress;Hypertension;Lipids Weight Management/Obesity;Stress;Hypertension;Lipids Weight Management/Obesity;Stress;Hypertension;Lipids Review Jareese started intensive cardiac rehab on 06/09/22. VSS. Arie did well with exercise. Pasqual is doing well with exercise. Stated chest pain is musculoskeletal per cardiologist and not cardiac-related. Stated he is breathing better. VSS. Elisandro is doing well with exercise. VSS. Eryc has lost 2.7 kg since starting cardiac rehab. Lester will complete intensive cardiac rehab on 08/15/22 Naftula is doing well with exercise. VSS. Rogen has lost 2.4 kg since starting cardiac rehab. Jumal will complete intensive cardiac rehab on 08/29/22 Expected Outcomes Monroe will continue to participate in intensive cardiac rehab for exercise, nutrition and lifestyle modifications Elva will continue to participate in intensive cardiac rehab for  exercise, nutrition and lifestyle modifications Bralin will continue to participate in intensive cardiac rehab for exercise,  nutrition and lifestyle modifications Jaekob will continue to exercise, folow  nutrition and lifestyle modifications upon completion of cardiac rehab    Exercise Goals and Review: Exercise Goals  Row Name 06/03/22 0812 Exercise Goals Increase Physical Activity Yes Intervention Provide advice, education, support and counseling about physical activity/exercise needs.;Develop an individualized exercise prescription for aerobic and resistive training based on initial evaluation findings, risk stratification, comorbidities and participant's personal goals. Expected Outcomes Short Term: Attend rehab on a regular basis to increase amount of physical activity.;Long Term: Add in home exercise to make exercise part of routine and to increase amount of physical activity.;Long Term: Exercising regularly at least 3-5 days a week. Increase Strength and Stamina Yes Intervention Provide advice, education, support and counseling about physical activity/exercise needs.;Develop an individualized exercise prescription for aerobic and resistive training based on initial evaluation findings, risk stratification, comorbidities and participant's personal goals. Expected Outcomes Short Term: Increase workloads from initial exercise prescription for resistance, speed, and METs.;Short Term: Perform resistance training exercises routinely during rehab and add in resistance training at home;Long Term: Improve cardiorespiratory fitness, muscular endurance and strength as measured by increased METs and functional capacity ( ) Able to understand and use rate of perceived exertion (RPE) scale Yes Intervention Provide education and explanation on how to use RPE scale Expected Outcomes Short Term: Able to use RPE daily in rehab to express subjective intensity level;Long Term:  Able to use RPE to  guide intensity level when exercising independently Knowledge and understanding of Target Heart Rate Range (THRR) Yes Intervention Provide education and explanation of THRR including how the numbers were predicted and where they are located for reference Expected Outcomes Short Term: Able to state/look up THRR;Short Term: Able to use daily as guideline for intensity in rehab;Long Term: Able to use THRR to govern intensity when exercising independently Understanding of Exercise Prescription Yes Intervention Provide education, explanation, and written materials on patient's individual exercise prescription Expected Outcomes Short Term: Able to explain program exercise prescription;Long Term: Able to explain home exercise prescription to exercise independently    Exercise Goals Re-Evaluation: Exercise Goals Re-Evaluation  Row Name 06/09/22 1051 07/09/22 1115 08/08/22 1608 Exercise Goal Re-Evaluation Exercise Goals Review Increase Physical Activity;Increase Strength and Stamina;Able to understand and use rate of perceived exertion (RPE) scale;Knowledge and understanding of Target Heart Rate Range (THRR);Understanding of Exercise Prescription Increase Physical Activity;Increase Strength and Stamina;Able to understand and use rate of perceived exertion (RPE) scale;Knowledge and understanding of Target Heart Rate Range (THRR);Understanding of Exercise Prescription Increase Physical Activity;Increase Strength and Stamina;Able to understand and use rate of perceived exertion (RPE) scale;Knowledge and understanding of Target Heart Rate Range (THRR);Understanding of Exercise Prescription Comments Pt's first day in the [facility] program. Pt understands the Exercise Rx, RPE scale, and THRR. Reviewed goals and home exercise Rx with patient today. Pt voices he is exceeding his short term goal of increased strength, stamina and energy. Pt voices feeling much better. Pt has also achieved his long term goal of  returning to work. Pt has peak METs of 3.0. Reviewed METs and goals. Pt voice he has made good progress on his gola of increased energy, strength, and stamina. Pt is back to work and feeling good about that. He is walking 30 minutes Expected Outcomes Will continue to monitor the patient and progress exercise workloads as tolerated. Will continue to monitor the patient and progress exercise workloads as tolerated. Will continue to monitor the patient and progress exercise workloads as tolerated.    Nutrition & Weight -  Outcomes: Pre Biometrics - 06/03/22 0845  Pre Biometrics Waist Circumference 36 inches  Hip Circumference 39.5 inches  Waist to Hip Ratio 0.91 %  Triceps Skinfold 12 mm  % Body Fat 20.6 %  Grip Strength 42 kg  Flexibility 14.5 in  Single Leg Stand 6.78 seconds    Post Biometrics - 08/22/22 0800   Post  Biometrics Height 5\' 8"  (1.727 m)  Weight 74 kg  Waist Circumference 34.5 inches  Hip Circumference 38.5 inches  Waist to Hip Ratio 0.9 %  BMI (Calculated) 24.81  Triceps Skinfold 10 mm  Grip Strength 42 kg  Flexibility 13.5 in  Single Leg Stand 6.87 seconds     Nutrition: Nutrition Therapy & Goals - 08/08/22 1014  Nutrition Therapy Diet Heart Healthy Diet  Personal Nutrition Goals Nutrition Goal Patient to identify strategies for reducing cardiovascular risk by attending the [employer] education and nutrition series weekly.  Personal Goal #2 Patient to improve diet quality by using the plate method as a guide for meal planning to include lean protein/plant protein, fruits, vegetables, whole grains, nonfat dairy as part of a well-balanced diet.  Personal Goal #3 Patient to reduce sodium intake to 1500mg  per day  Comments Goals in action. Iyaad continues to attend the [employer] and nutrition series regularly. He has started making many dietary changes including reduced saturated fat and increased dietary fiber. He is down 6.2#  since starting with our program; he is motivated to maintain weight. We have disucssed multiple strategies for increasing calories including increasing eating frequency, increasing carbohdyrate/protein/fat intake, calorie density, etc. Othmar will benefit form participation in intensive cardiac rehab for nutrition, exercise, and lifestyle modification.  Intervention Plan Intervention Nutrition handout(s) given to patient.;Prescribe, educate and counsel regarding individualized specific dietary modifications aiming towards targeted core components such as weight, hypertension, lipid management, diabetes, heart failure and other comorbidities.  Expected Outcomes Short Term Goal: Understand basic principles of dietary content, such as calories, fat, sodium, cholesterol and nutrients.;Long Term Goal: Adherence to prescribed nutrition plan.     Nutrition Discharge: Nutrition Assessments - 06/09/22 1014  Rate Your Plate Scores Pre Score 69     Education Questionnaire Score: Knowledge Questionnaire Score - 06/03/22 1248  Knowledge Questionnaire Score Pre Score 20/24     Goals reviewed with patient; copy given to patient.Pt graduates from  Intensive/Traditional cardiac rehab program today with completion of  59 exercise and education sessions. Pt maintained good attendance and progressed nicely during their participation in rehab as evidenced by increased MET level.  Bastian increased his distance on his post exercise walk test by 351 feet  Medication list reconciled. Repeat  PHQ score- 1 .  Pt has made significant lifestyle changes and should be commended for their success. Aarush achieved their goals during cardiac rehab.   Pt plans to continue exercise at the [facility] 3 days a week. We are proud of Burrell's progress!Thayer Headings RN BSN

## 2022-08-27 NOTE — Progress Notes (Signed)
Cardiac Individual Treatment Plan  Patient Details  Name: KENTRAVION RIEDEL MRN: [id number] Date of Birth: Mar 15, 1945 Referring Provider:   Flowsheet Row INTENSIVE CARDIAC REHAB ORIENT from 06/03/2022 in [facility]  Referring Provider Dr. Keitha Butte, MD covering)       Initial Encounter Date:  Flowsheet Row INTENSIVE CARDIAC REHAB ORIENT from 06/03/2022 in [facility]  Date 06/03/22       Visit Diagnosis: 05/10/22 NSTEMI at [facility]  05/10/22 S/P DES x 3 at [facility]  04/16/22 S/P TAVR at  [facility]  Patient's Home Medications on Admission:  Current Outpatient Medications:    acetaminophen (TYLENOL) 500 MG tablet, Take 500 mg by mouth every 6 (six) hours as needed for moderate pain., Disp: , Rfl:    amoxicillin (AMOXIL) 500 MG capsule, Take 500 mg by mouth as directed. Take 4 capsules by mouth as needed for dental procedures, Disp: , Rfl:    aspirin 81 MG tablet, Take 81 mg by mouth daily., Disp: , Rfl:    clopidogrel (PLAVIX) 75 MG tablet, Take 75 mg by mouth daily., Disp: , Rfl:    eplerenone (INSPRA) 25 MG tablet, Take 25 mg by mouth daily., Disp: , Rfl:    Evolocumab (REPATHA SURECLICK) 140 MG/ML SOAJ, INJECT 1 PEN INTO THE SKIN EVERY 14 DAYS, Disp: 2 mL, Rfl: 11   latanoprost (XALATAN) 0.005 % ophthalmic solution, Place 1 drop into both eyes at bedtime., Disp: , Rfl:    levocetirizine (XYZAL) 5 MG tablet, Take 5 mg by mouth daily., Disp: , Rfl:    lisinopril (ZESTRIL) 10 MG tablet, Take 1 tablet by mouth daily., Disp: , Rfl:    metoprolol succinate (TOPROL-XL) 50 MG 24 hr tablet, Take 1 tablet (50 mg total) by mouth daily., Disp: 30 tablet, Rfl: 0   montelukast (SINGULAIR) 10 MG tablet, Take 10 mg by mouth at bedtime., Disp: , Rfl:    Multiple Vitamin (MULTIVITAMIN) tablet, Take 1 tablet by mouth daily. (Patient not taking: Reported on  06/03/2022), Disp: , Rfl:    nitroGLYCERIN (NITROSTAT) 0.4 MG SL tablet, Place 1 tablet (0.4 mg total) under the tongue every 5 (five) minutes as needed for chest pain., Disp: 25 tablet, Rfl: 11   polyethylene glycol (MIRALAX / GLYCOLAX) 17 g packet, Take 17 g by mouth daily as needed for mild constipation., Disp: , Rfl:    rosuvastatin (CRESTOR) 40 MG tablet, Take 1 tablet by mouth at bedtime., Disp: , Rfl:    sodium chloride (OCEAN) 0.65 % SOLN nasal spray, Place 1 spray into both nostrils as needed for congestion., Disp: , Rfl:    tamsulosin (FLOMAX) 0.4 MG CAPS capsule, Take 0.4 mg by mouth daily., Disp: , Rfl:    testosterone cypionate (DEPOTESTOSTERONE CYPIONATE) 200 MG/ML injection, Inject 200 mg into the muscle every 14 (fourteen) days., Disp: , Rfl:    TURMERIC PO, Take 2 capsules by mouth daily., Disp: , Rfl:    zolpidem (AMBIEN) 10 MG tablet, TAKE 1 TABLET(10 MG) BY MOUTH AT BEDTIME AS NEEDED FOR SLEEP, Disp: 30 tablet, Rfl: 0  Past Medical History: Past Medical History:  Diagnosis Date   Arthritis    "hands, knees, elbows, fingers" (01/25/2013)   Coronary artery disease    HX OF HEART STENTS X 2   2010--DR. H. SMITH IS PT'S CARDIOLOGIST, MILD AORTIC STENOSIS   Dizziness    Enlarged prostate  FLOMAX HELPS PT VOID   Exertional shortness of breath    GERD (gastroesophageal reflux disease)    OTC PREVACID AS NEEDED   Glaucoma    BOTH EYES   Gout    Hyperlipidemia    Hypertension    Hypertension    Iron deficiency anemia    hx   Migraine    "monthly" (01/25/2013)   Peripheral vascular disease (HCC)    CLAUDICATION    Tobacco Use: Social History   Tobacco Use  Smoking Status Never  Smokeless Tobacco Never    Labs: Review Flowsheet  More data exists      Latest Ref Rng & Units 09/22/2018 11/22/2018 08/22/2019 02/20/2020 11/13/2021  Labs for ITP Cardiac and Pulmonary Rehab  Cholestrol 100 - 199 mg/dL 161  096  045  409  -  LDL (calc) 0 - 99 mg/dL 71  79  45  88  -   HDL-C >39 mg/dL 33  36  38  34  -  Trlycerides 0 - 149 mg/dL 811  914  782  956  -  PH, Arterial 7.35 - 7.45 - - - - 7.376   PCO2 arterial 32 - 48 mmHg - - - - 37.8   Bicarbonate 20.0 - 28.0 mmol/L - - - - 22.2  24.6  22.9   TCO2 22 - 32 mmol/L - - - - 23  26  24    Acid-base deficit 0.0 - 2.0 mmol/L - - - - 3.0  1.0  2.0   O2 Saturation % - - - - 95  77  79     Details       Multiple values from one day are sorted in reverse-chronological order         Capillary Blood Glucose: Lab Results  Component Value Date   GLUCAP 100 (H) 07/25/2022   GLUCAP 84 05/10/2022   GLUCAP 137 (H) 09/03/2017     Exercise Target Goals: Exercise Program Goal: Individual exercise prescription set using results from initial 6 min walk test and THRR while considering  patient's activity barriers and safety.   Exercise Prescription Goal: Initial exercise prescription builds to 30-45 minutes a day of aerobic activity, 2-3 days per week.  Home exercise guidelines will be given to patient during program as part of exercise prescription that the participant will acknowledge.  Activity Barriers & Risk Stratification:  Activity Barriers & Cardiac Risk Stratification - 06/03/22 0813       Activity Barriers & Cardiac Risk Stratification   Activity Barriers Back Problems;Arthritis;Neck/Spine Problems;Joint Problems;Deconditioning;Muscular Weakness;Chest Pain/Angina;Balance Concerns    Cardiac Risk Stratification High             6 Minute Walk:  6 Minute Walk     Row Name 06/03/22 0919 08/20/22 0910       6 Minute Walk   Phase Initial Discharge    Distance 1583 feet 1934 feet    Distance % Change -- 22.17 %    Distance Feet Change -- 351 ft    Walk Time 6 minutes 6 minutes    # of Rest Breaks 0 0    MPH 3 3.7    METS 3.5 3.92    RPE 7 12.5    Perceived Dyspnea  0 0    VO2 Peak 12.1 13.93    Symptoms Yes (comment) Yes (comment)    Comments Left great toe pain 2/10 (gout) Left shoulder  pain, 2.5/10    Resting HR 73 bpm 62  bpm    Resting BP 110/60 128/72    Resting Oxygen Saturation  98 % --    Exercise Oxygen Saturation  during 6 min walk 100 % --    Max Ex. HR 112 bpm 92 bpm    Max Ex. BP 130/72 134/62    2 Minute Post BP 122/70 --             Oxygen Initial Assessment:   Oxygen Re-Evaluation:   Oxygen Discharge (Final Oxygen Re-Evaluation):   Initial Exercise Prescription:  Initial Exercise Prescription - 06/03/22 0900       Date of Initial Exercise RX and Referring Provider   Date 06/03/22    Referring Provider Dr. Keitha Butte, MD covering)    Expected Discharge Date 08/15/22      NuStep   Level 2    SPM 80    Minutes 15    METs 3.5      Arm Ergometer   Level 1.5    Watts 25    RPM 60    Minutes 15    METs 3.5      Prescription Details   Frequency (times per week) 3    Duration Progress to 30 minutes of continuous aerobic without signs/symptoms of physical distress      Intensity   THRR 40-80% of Max Heartrate 58-117    Ratings of Perceived Exertion 11-13    Perceived Dyspnea 0-4      Progression   Progression Continue progressive overload as per policy without signs/symptoms or physical distress.      Resistance Training   Training Prescription Yes    Weight 3 lbs    Reps 10-15             Perform Capillary Blood Glucose checks as needed.  Exercise Prescription Changes:   Exercise Prescription Changes     Row Name 06/09/22 1000 06/25/22 1400 07/09/22 1100 07/16/22 1100 08/08/22 1600     Response to Exercise   Blood Pressure (Admit) 110/72 108/66 120/58 120/60 124/68   Blood Pressure (Exercise) 140/72 122/60 136/64 140/62 146/78   Blood Pressure (Exit) 108/60 118/56 108/68 114/70 106/56   Heart Rate (Admit) 61 bpm 67 bpm 63 bpm 74 bpm 59 bpm   Heart Rate (Exercise) 97 bpm 81 bpm 89 bpm 97 bpm 92 bpm   Heart Rate (Exit) 70 bpm 66 bpm 68 bpm 58 bpm 69 bpm   Rating of Perceived Exertion (Exercise) 11 12 12  13 13    Symptoms None None None None None   Comments Pt's first day in the CRP2 program Reviewed METs Reviewed Goals/Home Ex Rx Reviewed METs Reviewed METs and goals   Duration Continue with 30 min of aerobic exercise without signs/symptoms of physical distress. Continue with 30 min of aerobic exercise without signs/symptoms of physical distress. Continue with 30 min of aerobic exercise without signs/symptoms of physical distress. Continue with 30 min of aerobic exercise without signs/symptoms of physical distress. Continue with 30 min of aerobic exercise without signs/symptoms of physical distress.   Intensity THRR unchanged THRR unchanged THRR unchanged THRR unchanged THRR unchanged     Progression   Progression Continue to progress workloads to maintain intensity without signs/symptoms of physical distress. Continue to progress workloads to maintain intensity without signs/symptoms of physical distress. Continue to progress workloads to maintain intensity without signs/symptoms of physical distress. Continue to progress workloads to maintain intensity without signs/symptoms of physical distress. Continue to progress workloads to maintain intensity without signs/symptoms of  physical distress.   Average METs 2.25 2.6 2.7 3.5 3.4     Resistance Training   Training Prescription Yes No No No Yes   Weight 3 lbs No wts on Wednesddays No wts on Wednesddays No wts on Wednesddays 4 lbs   Reps 10-15 -- -- -- 10-15   Time 10 Minutes -- -- -- 10 Minutes     Interval Training   Interval Training No No No No No     NuStep   Level 2 2 3 3 4    SPM 78 84 93 100 94   Minutes 15 30 15 15 15    METs 2.3 2.6 3 3.5 3.8     Arm Ergometer   Level 1.5 1.5 1.5 1.5 1.5   Watts 13 -- 19 -- 18   RPM 49 -- 56 -- 56   Minutes 15 -- 15 15 15    METs 2.2 -- 2.4 -- 2.3     Home Exercise Plan   Plans to continue exercise at -- -- Home (comment) Home (comment) Home (comment)   Frequency -- -- Add 2 additional days to  program exercise sessions. Add 2 additional days to program exercise sessions. Add 2 additional days to program exercise sessions.   Initial Home Exercises Provided -- -- 07/09/22 07/09/22 07/09/22            Exercise Comments:   Exercise Comments     Row Name 06/09/22 1053 06/25/22 1449 07/09/22 1118 07/16/22 1124 08/08/22 1620   Exercise Comments Pt's first day in the CRP2 program. Pt exercised with no complaints and is off to a good start. Reviewed METs. Pt is making progressin the program. 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 Reviewed goals and home Exercise Rx. Pt is progressing well. Pt walks some at home. Discussed home exercise Rx and pt will walk at least 2x/week for 30 minutes in addtion to the 3x/week in the CRP2 program. Goal is 150+ minutes of exercise per week. Pt verbalized understanding of the home exericse Rx and was provided a copy. Reviewed METs. Pt is progressing. Will increase workload on nustep next session Reviewed MEts and goals. Pt is open to increasing workload on Nsutep next session.            Exercise Goals and Review:   Exercise Goals     Row Name 06/03/22 4696             Exercise Goals   Increase Physical Activity Yes       Intervention Provide advice, education, support and counseling about physical activity/exercise needs.;Develop an individualized exercise prescription for aerobic and resistive training based on initial evaluation findings, risk stratification, comorbidities and participant's personal goals.       Expected Outcomes Short Term: Attend rehab on a regular basis to increase amount of physical activity.;Long Term: Add in home exercise to make exercise part of routine and to increase amount of physical activity.;Long Term: Exercising regularly at least 3-5 days a week.       Increase Strength and Stamina Yes       Intervention Provide advice, education, support and counseling about physical  activity/exercise needs.;Develop an individualized exercise prescription for aerobic and resistive training based on initial evaluation findings, risk stratification, comorbidities and participant's personal goals.       Expected Outcomes Short Term: Increase workloads from initial exercise prescription for resistance, speed, and METs.;Short Term: Perform resistance training exercises routinely during  rehab and add in resistance training at home;Long Term: Improve cardiorespiratory fitness, muscular endurance and strength as measured by increased METs and functional capacity ( )       Able to understand and use rate of perceived exertion (RPE) scale Yes       Intervention Provide education and explanation on how to use RPE scale       Expected Outcomes Short Term: Able to use RPE daily in rehab to express subjective intensity level;Long Term:  Able to use RPE to guide intensity level when exercising independently       Knowledge and understanding of Target Heart Rate Range (THRR) Yes       Intervention Provide education and explanation of THRR including how the numbers were predicted and where they are located for reference       Expected Outcomes Short Term: Able to state/look up THRR;Short Term: Able to use daily as guideline for intensity in rehab;Long Term: Able to use THRR to govern intensity when exercising independently       Understanding of Exercise Prescription Yes       Intervention Provide education, explanation, and written materials on patient's individual exercise prescription       Expected Outcomes Short Term: Able to explain program exercise prescription;Long Term: Able to explain home exercise prescription to exercise independently                Exercise Goals Re-Evaluation :  Exercise Goals Re-Evaluation     Row Name 06/09/22 1051 07/09/22 1115 08/08/22 1608         Exercise Goal Re-Evaluation   Exercise Goals Review Increase Physical Activity;Increase Strength and  Stamina;Able to understand and use rate of perceived exertion (RPE) scale;Knowledge and understanding of Target Heart Rate Range (THRR);Understanding of Exercise Prescription Increase Physical Activity;Increase Strength and Stamina;Able to understand and use rate of perceived exertion (RPE) scale;Knowledge and understanding of Target Heart Rate Range (THRR);Understanding of Exercise Prescription Increase Physical Activity;Increase Strength and Stamina;Able to understand and use rate of perceived exertion (RPE) scale;Knowledge and understanding of Target Heart Rate Range (THRR);Understanding of Exercise Prescription     Comments Pt's first day in the CRP2 program. Pt understands the Exercise Rx, RPE scale, and THRR. Reviewed goals and home exercise Rx with patient today. Pt voices he is exceeding his short term goal of increased strength, stamina and energy. Pt voices feeling much better. Pt has also achieved his long term goal of returning to work. Pt has peak METs of 3.0. Reviewed METs and goals. Pt voice he has made good progress on his gola of increased energy, strength, and stamina. Pt is back to work and feeling good about that. He is walking 30 minutes     Expected Outcomes Will continue to monitor the patient and progress exercise workloads as tolerated. Will continue to monitor the patient and progress exercise workloads as tolerated. Will continue to monitor the patient and progress exercise workloads as tolerated.              Discharge Exercise Prescription (Final Exercise Prescription Changes):  Exercise Prescription Changes - 08/08/22 1600       Response to Exercise   Blood Pressure (Admit) 124/68    Blood Pressure (Exercise) 146/78    Blood Pressure (Exit) 106/56    Heart Rate (Admit) 59 bpm    Heart Rate (Exercise) 92 bpm    Heart Rate (Exit) 69 bpm    Rating of Perceived Exertion (Exercise) 13    Symptoms  None    Comments Reviewed METs and goals    Duration Continue with 30  min of aerobic exercise without signs/symptoms of physical distress.    Intensity THRR unchanged      Progression   Progression Continue to progress workloads to maintain intensity without signs/symptoms of physical distress.    Average METs 3.4      Resistance Training   Training Prescription Yes    Weight 4 lbs    Reps 10-15    Time 10 Minutes      Interval Training   Interval Training No      NuStep   Level 4    SPM 94    Minutes 15    METs 3.8      Arm Ergometer   Level 1.5    Watts 18    RPM 56    Minutes 15    METs 2.3      Home Exercise Plan   Plans to continue exercise at Home (comment)    Frequency Add 2 additional days to program exercise sessions.    Initial Home Exercises Provided 07/09/22             Nutrition:  Target Goals: Understanding of nutrition guidelines, daily intake of sodium 1500mg , cholesterol 200mg , calories 30% from fat and 7% or less from saturated fats, daily to have 5 or more servings of fruits and vegetables.  Biometrics:  Pre Biometrics - 06/03/22 0845       Pre Biometrics   Waist Circumference 36 inches    Hip Circumference 39.5 inches    Waist to Hip Ratio 0.91 %    Triceps Skinfold 12 mm    % Body Fat 20.6 %    Grip Strength 42 kg    Flexibility 14.5 in    Single Leg Stand 6.78 seconds             Post Biometrics - 08/22/22 0800        Post  Biometrics   Height 5\' 8"  (1.727 m)    Weight 74 kg    Waist Circumference 34.5 inches    Hip Circumference 38.5 inches    Waist to Hip Ratio 0.9 %    BMI (Calculated) 24.81    Triceps Skinfold 10 mm    Grip Strength 42 kg    Flexibility 13.5 in    Single Leg Stand 6.87 seconds             Nutrition Therapy Plan and Nutrition Goals:  Nutrition Therapy & Goals - 08/08/22 1014       Nutrition Therapy   Diet Heart Healthy Diet      Personal Nutrition Goals   Nutrition Goal Patient to identify strategies for reducing cardiovascular risk by attending the  [employer] education and nutrition series weekly.    Personal Goal #2 Patient to improve diet quality by using the plate method as a guide for meal planning to include lean protein/plant protein, fruits, vegetables, whole grains, nonfat dairy as part of a well-balanced diet.    Personal Goal #3 Patient to reduce sodium intake to 1500mg  per day    Comments Goals in action. Mcarthur continues to attend the [employer] and nutrition series regularly. He has started making many dietary changes including reduced saturated fat and increased dietary fiber. He is down 6.2# since starting with our program; he is motivated to maintain weight. We have disucssed multiple strategies for increasing calories including increasing eating frequency, increasing carbohdyrate/protein/fat intake,  calorie density, etc. Tahaj will benefit form participation in intensive cardiac rehab for nutrition, exercise, and lifestyle modification.      Intervention Plan   Intervention Nutrition handout(s) given to patient.;Prescribe, educate and counsel regarding individualized specific dietary modifications aiming towards targeted core components such as weight, hypertension, lipid management, diabetes, heart failure and other comorbidities.    Expected Outcomes Short Term Goal: Understand basic principles of dietary content, such as calories, fat, sodium, cholesterol and nutrients.;Long Term Goal: Adherence to prescribed nutrition plan.             Nutrition Assessments:  Nutrition Assessments - 06/09/22 1014       Rate Your Plate Scores   Pre Score 69            MEDIFICTS Score Key: ?70 Need to make dietary changes  40-70 Heart Healthy Diet ? 40 Therapeutic Level Cholesterol Diet   Flowsheet Row INTENSIVE CARDIAC REHAB from 06/09/2022 in [facility]  Picture Your Plate Total Score on Admission 69      Picture Your Plate Scores: <84 Unhealthy dietary  pattern with much room for improvement. 41-50 Dietary pattern unlikely to meet recommendations for good health and room for improvement. 51-60 More healthful dietary pattern, with some room for improvement.  >60 Healthy dietary pattern, although there may be some specific behaviors that could be improved.    Nutrition Goals Re-Evaluation:  Nutrition Goals Re-Evaluation     Row Name 06/09/22 1016 07/11/22 0953 08/08/22 1014         Goals   Current Weight 164 lb 14.5 oz (74.8 kg) 159 lb 6.3 oz (72.3 kg) 158 lb 11.7 oz (72 kg)     Comment GFR 39, Cr 1.8, lipids WNL, A1c WNL no new labs; most recent labs  GFR 39, Cr 1.8, lipids WNL, A1c WNL no new labs; most recent labs GFR 39, Cr 1.8, lipids WNL, A1c WNL     Expected Outcome Virge will benefit form participation in intensive cardiac rehab for nutrition, exercise, and lifestyle modification. Goals in action. Paublo continues to attend the [employer] and nutrition series regularly. He has started making many dietary changes including reduced saturated fat and increased dietary fiber. He is down 5.5# since starting with our program; he is motivated to maintain weight. We have disucssed multiple strategies for increasing calories including increasing eating frequency, increasing carbohdyrate/protein/fat intake, etc. Kerrigan will benefit form participation in intensive cardiac rehab for nutrition, exercise, and lifestyle modification. Goals in action. Brekin continues to attend the [employer] and nutrition series regularly. He has started making many dietary changes including reduced saturated fat and increased dietary fiber. He is down 6.2# since starting with our program; he is motivated to maintain weight. BMI remains appropriate. We have disucssed multiple strategies for increasing calories including increasing eating frequency, increasing carbohdyrate/protein/fat intake, calorie density, etc. Kristie will benefit form participation in  intensive cardiac rehab for nutrition, exercise, and lifestyle modification.              Nutrition Goals Re-Evaluation:  Nutrition Goals Re-Evaluation     Row Name 06/09/22 1016 07/11/22 0953 08/08/22 1014         Goals   Current Weight 164 lb 14.5 oz (74.8 kg) 159 lb 6.3 oz (72.3 kg) 158 lb 11.7 oz (72 kg)     Comment GFR 39, Cr 1.8, lipids WNL, A1c WNL no new labs; most recent labs  GFR 39, Cr  1.8, lipids WNL, A1c WNL no new labs; most recent labs GFR 39, Cr 1.8, lipids WNL, A1c WNL     Expected Outcome Martie will benefit form participation in intensive cardiac rehab for nutrition, exercise, and lifestyle modification. Goals in action. Ponce continues to attend the [employer] and nutrition series regularly. He has started making many dietary changes including reduced saturated fat and increased dietary fiber. He is down 5.5# since starting with our program; he is motivated to maintain weight. We have disucssed multiple strategies for increasing calories including increasing eating frequency, increasing carbohdyrate/protein/fat intake, etc. Macaleb will benefit form participation in intensive cardiac rehab for nutrition, exercise, and lifestyle modification. Goals in action. Sig continues to attend the [employer] and nutrition series regularly. He has started making many dietary changes including reduced saturated fat and increased dietary fiber. He is down 6.2# since starting with our program; he is motivated to maintain weight. BMI remains appropriate. We have disucssed multiple strategies for increasing calories including increasing eating frequency, increasing carbohdyrate/protein/fat intake, calorie density, etc. Corleone will benefit form participation in intensive cardiac rehab for nutrition, exercise, and lifestyle modification.              Nutrition Goals Discharge (Final Nutrition Goals Re-Evaluation):  Nutrition Goals Re-Evaluation - 08/08/22 1014        Goals   Current Weight 158 lb 11.7 oz (72 kg)    Comment no new labs; most recent labs GFR 39, Cr 1.8, lipids WNL, A1c WNL    Expected Outcome Goals in action. Kriyansh continues to attend the [employer] and nutrition series regularly. He has started making many dietary changes including reduced saturated fat and increased dietary fiber. He is down 6.2# since starting with our program; he is motivated to maintain weight. BMI remains appropriate. We have disucssed multiple strategies for increasing calories including increasing eating frequency, increasing carbohdyrate/protein/fat intake, calorie density, etc. Xzaiver will benefit form participation in intensive cardiac rehab for nutrition, exercise, and lifestyle modification.             Psychosocial: Target Goals: Acknowledge presence or absence of significant depression and/or stress, maximize coping skills, provide positive support system. Participant is able to verbalize types and ability to use techniques and skills needed for reducing stress and depression.  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 Has not returned to work yet. Pt declines counseling at this time, but voices he knows he can if needed.      Family Dynamics   Good Support System? Yes    Comments Wife and son are pt's support      Barriers   Psychosocial barriers to participate in program The patient should benefit from training in stress management and relaxation.      Screening Interventions   Interventions Encouraged to exercise    Expected Outcomes Short Term goal: Identification and review with participant of any Quality of Life or Depression concerns found by scoring the questionnaire.;Long Term goal: The participant improves quality of  Life and PHQ9 Scores as seen by post scores and/or verbalization of changes;Long Term Goal: Stressors or current issues are controlled or eliminated.             Quality of Life Scores:  Quality of Life - 06/03/22 1248       Quality of Life   Select Quality of Life      Quality of Life Scores   Health/Function Pre 12.43 %    Socioeconomic Pre 19.75 %    Psych/Spiritual Pre 23.57 %    Family Pre 16.9 %    GLOBAL Pre 16.97 %            Scores of 19 and below usually indicate a poorer quality of life in these areas.  A difference of  2-3 points is a clinically meaningful difference.  A difference of 2-3 points in the total score of the Quality of Life Index has been associated with significant improvement in overall quality of life, self-image, physical symptoms, and general health in studies assessing change in quality of life.  PHQ-9: Review Flowsheet  More data exists      06/03/2022 02/16/2020 08/22/2019 04/04/2019 03/08/2019  Depression screen PHQ 2/9  Decreased Interest 1 0 0 0 0  Down, Depressed, Hopeless 2 0 0 0 0  PHQ - 2 Score 3 0 0 0 0  Altered sleeping 1 - - - -  Tired, decreased energy 1 - - - -  Change in appetite 0 - - - -  Feeling bad or failure about yourself  2 - - - -  Trouble concentrating 1 - - - -  Moving slowly or fidgety/restless 2 - - - -  Suicidal thoughts 0 - - - -  PHQ-9 Score 10 - - - -  Difficult doing work/chores Not difficult at all - - - -    Details           Interpretation of Total Score  Total Score Depression Severity:  1-4 = Minimal depression, 5-9 = Mild depression, 10-14 = Moderate depression, 15-19 = Moderately severe depression, 20-27 = Severe depression   Psychosocial Evaluation and Intervention:   Psychosocial Re-Evaluation:  Psychosocial Re-Evaluation     Row Name 06/09/22 1327 07/01/22 1032 07/29/22 1705 08/27/22 0851       Psychosocial Re-Evaluation   Current issues with Current Stress Concerns;Current Depression  Current Stress Concerns;Current Depression Current Stress Concerns;Current Depression None Identified    Comments Reviewed quality of life questionnaire. Heywood admits to being depressed he feels his heart attack happened becuase he did not follow the doctor's reccomendation and went back to work too soon. Layke is returning back to work tomorrow with reduced hours. Patient's PCP notified. Azaan syas he is interesed in counselling Earlis has not voiced nay concerns or stressors during exercise with [facility]. Jarvon told me that his wife was recently in the hospital and is recovering at home now Salome is feeling better, and back to work.  No psychosocial needs identified. No interventions needed    Expected Outcomes Jonnathon will have decreased depression, stress concerns upon completion of intensive cardiac rehab. Rowell will have decreased depression, stress concerns upon completion of intensive cardiac rehab. Nishad will have decreased depression, stress concerns upon completion of intensive cardiac rehab. Shayde will have decreased depression, stress concerns upon completion of intensive cardiac rehab.    Interventions Physician referral;Encouraged to attend Cardiac Rehabilitation for the exercise;Stress management education Physician referral;Encouraged to attend Cardiac Rehabilitation for the exercise;Stress management education Physician referral;Encouraged to attend Cardiac Rehabilitation for the exercise;Stress management education Encouraged to attend Cardiac Rehabilitation for the exercise    Continue Psychosocial Services  Follow up required by staff No Follow up required No Follow up required  No Follow up required      Initial Review   Source of Stress Concerns Chronic Illness;Occupation Chronic Illness;Occupation Chronic Illness;Occupation --    Comments Will continue to monitor and offer support as needed Will continue to monitor and offer support as needed Will continue to monitor and offer  support as needed --             Psychosocial Discharge (Final Psychosocial Re-Evaluation):  Psychosocial Re-Evaluation - 08/27/22 0851       Psychosocial Re-Evaluation   Current issues with None Identified    Comments Jorgeluis is feeling better, and back to work.  No psychosocial needs identified. No interventions needed    Expected Outcomes Cleston will have decreased depression, stress concerns upon completion of intensive cardiac rehab.    Interventions Encouraged to attend Cardiac Rehabilitation for the exercise    Continue Psychosocial Services  No Follow up required             Vocational Rehabilitation: Provide vocational rehab assistance to qualifying candidates.   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            Education: Education Goals: Education classes will be provided on a weekly basis, covering required topics. Participant will state understanding/return demonstration of topics presented.    Education     Row Name 06/09/22 0900     Education   Cardiac Education Topics Pritikin   Glass blower/designer Nutrition   Instruction Review Code 1- Verbalizes Understanding   Class Start Time 0815   Class Stop Time 0858   Class Time Calculation (min) 43 min    Row Name 06/11/22 0900     Education   Cardiac Education Topics Pritikin   Secondary school teacher School   Educator Dietitian   Weekly Topic Simple Sides and Sauces   Instruction Review Code 1- Verbalizes Understanding   Class Start Time 0815   Class Stop Time 0850   Class Time Calculation (min) 35 min    Row Name 06/13/22 0900     Education   Cardiac Education Topics Pritikin   Select Core Videos     Core Videos    Educator Exercise Physiologist   Select Psychosocial   Psychosocial How Our Thoughts Can Heal Our Hearts   Instruction Review Code 1- Verbalizes Understanding   Class Start Time 0815   Class Stop Time 0855   Class Time Calculation (min) 40 min    Row Name 06/16/22 1000     Education   Cardiac Education Topics Pritikin   Geographical information systems officer Psychosocial   Psychosocial Workshop From Head to Heart: The Power of a Healthy Outlook   Instruction Review Code 1- Verbalizes Understanding   Class Start Time 0815   Class Stop Time 0903   Class Time Calculation (min) 48 min    Row Name 06/18/22 0900     Education   Cardiac Education Topics Pritikin   Customer service manager   Weekly Topic Powerhouse Plant-Based Proteins   Instruction Review Code 1- Verbalizes Understanding  Class Start Time 0810   Class Stop Time 0845   Class Time Calculation (min) 35 min    Row Name 06/20/22 0800     Education   Cardiac Education Topics Pritikin   Select Core Videos     Core Videos   Educator Exercise Physiologist   Select General Education   General Education Hypertension and Heart Disease   Instruction Review Code 1- Verbalizes Understanding   Class Start Time [phone]   Class Stop Time 0843   Class Time Calculation (min) 31 min    Row Name 06/25/22 1100     Education   Cardiac Education Topics Pritikin   Secondary school teacher School   Educator Dietitian   Weekly Topic Adding Flavor - Sodium-Free   Instruction Review Code 1- Verbalizes Understanding   Class Start Time [phone]   Class Stop Time 0845   Class Time Calculation (min) 33 min    Row Name 06/27/22 1400     Education   Cardiac Education Topics Pritikin   Psychologist, forensic General Education   General Education Heart Disease Risk Reduction   Instruction  Review Code 1- Verbalizes Understanding   Class Start Time 0810   Class Stop Time 0849   Class Time Calculation (min) 39 min    Row Name 06/30/22 0900     Education   Cardiac Education Topics Pritikin   Select Workshops     Workshops   Educator Exercise Physiologist   Select Exercise   Exercise Workshop Location manager and Fall Prevention   Instruction Review Code 1- Verbalizes Understanding   Class Start Time [phone]   Class Stop Time 0855   Class Time Calculation (min) 36 min    Row Name 07/02/22 0900     Education   Cardiac Education Topics Pritikin   Secondary school teacher School   Educator Dietitian   Weekly Topic Fast and Healthy Breakfasts   Instruction Review Code 1- Verbalizes Understanding   Class Start Time 0815   Class Stop Time 0848   Class Time Calculation (min) 33 min    Row Name 07/07/22 1100     Education   Cardiac Education Topics Pritikin   Select Workshops     Workshops   Educator Exercise Physiologist   Select Psychosocial   Psychosocial Workshop Recognizing and Reducing Stress   Instruction Review Code 1- Verbalizes Understanding   Class Start Time [phone]   Class Stop Time 0847   Class Time Calculation (min) 35 min    Row Name 07/09/22 1000     Education   Cardiac Education Topics Pritikin   Secondary school teacher School   Educator Dietitian   Weekly Topic Personalizing Your Pritikin Plate   Instruction Review Code 1- Verbalizes Understanding   Class Start Time 0815   Class Stop Time 1478   Class Time Calculation (min) 37 min    Row Name 07/11/22 0900     Education   Cardiac Education Topics Pritikin   Select Core Videos     Core Videos   Educator Exercise Physiologist   Select Psychosocial   Psychosocial Healthy Minds, Bodies, Hearts   Instruction Review Code 1- Verbalizes Understanding   Class Start Time 0815   Class Stop Time 0850   Class Time Calculation (min) 35 min    Row Name 07/14/22  0900      Education   Cardiac Education Topics Pritikin   Glass blower/designer Nutrition   Nutrition Workshop Label Reading   Instruction Review Code 1- Verbalizes Understanding   Class Start Time 0815   Class Stop Time 0858   Class Time Calculation (min) 43 min    Row Name 07/18/22 0900     Education   Cardiac Education Topics Pritikin   Select Core Videos     Core Videos   Educator Dietitian   Select Nutrition   Nutrition Other  label reading   Instruction Review Code 1- Verbalizes Understanding   Class Start Time 0810   Class Stop Time 0852   Class Time Calculation (min) 42 min    Row Name 07/21/22 0900     Education   Cardiac Education Topics Pritikin   Select Workshops     Workshops   Educator Exercise Physiologist   Select Exercise   Exercise Workshop Exercise Basics: Building Your Action Plan   Instruction Review Code 1- Verbalizes Understanding   Class Start Time 0808   Class Stop Time 0854   Class Time Calculation (min) 46 min    Row Name 07/23/22 0900     Education   Cardiac Education Topics Pritikin   Secondary school teacher School   Educator Dietitian   Weekly Topic Tasty Appetizers and Snacks   Instruction Review Code 1- Verbalizes Understanding   Class Start Time [phone]   Class Stop Time 0845   Class Time Calculation (min) 31 min    Row Name 07/25/22 0800     Education   Cardiac Education Topics Pritikin   Select Core Videos     Core Videos   Educator Exercise Physiologist   Select Nutrition   Nutrition Nutrition Action Plan   Instruction Review Code 1- Verbalizes Understanding   Class Start Time 0813   Class Stop Time 0843   Class Time Calculation (min) 30 min    Row Name 07/28/22 0900     Education   Cardiac Education Topics Pritikin   Select Core Videos     Core Videos   Educator Dietitian   Select Nutrition   Nutrition Calorie Density   Instruction Review Code 1- Verbalizes  Understanding   Class Start Time 0815   Class Stop Time 0857   Class Time Calculation (min) 42 min    Row Name 07/30/22 0800     Education   Cardiac Education Topics Pritikin   Psychologist, sport and exercise;Environmental education officer - Meals in a Snap   Instruction Review Code 1- Verbalizes Understanding   Class Start Time 0815   Class Stop Time 0850   Class Time Calculation (min) 35 min    Row Name 08/04/22 0900     Education   Cardiac Education Topics Pritikin   Select Workshops     Workshops   Educator Exercise Physiologist   Select Psychosocial   Psychosocial Workshop Focused Goals, Sustainable Changes   Instruction Review Code 1- Verbalizes Understanding   Class Start Time [phone]   Class Stop Time 0845   Class Time Calculation (min) 35 min    Row Name 08/06/22 0800     Education   Cardiac Education Topics Pritikin   Customer service manager  Weekly Topic One-Pot Wonders   Instruction Review Code 1- Verbalizes Understanding   Class Start Time [phone]   Class Stop Time [phone]   Class Time Calculation (min) 32 min    Row Name 08/08/22 0800     Education   Cardiac Education Topics Pritikin   Select Core Videos     Core Videos   Educator Exercise Physiologist   Select General Education   General Education Hypertension and Heart Disease   Instruction Review Code 1- Verbalizes Understanding   Class Start Time 0815   Class Stop Time 0850   Class Time Calculation (min) 35 min    Row Name 08/11/22 0800     Education   Cardiac Education Topics Pritikin   Select Core Videos     Core Videos   Educator Exercise Physiologist   Select Exercise Education   Exercise Education Biomechanial Limitations   Instruction Review Code 1- Verbalizes Understanding   Class Start Time 0810   Class Stop Time 0846   Class Time Calculation (min) 36 min    Row Name 08/13/22 1000      Education   Cardiac Education Topics Pritikin   Orthoptist   Educator Dietitian   Weekly Topic Comforting Weekend Breakfasts   Instruction Review Code 1- Verbalizes Understanding   Class Start Time 0815   Class Stop Time [phone]   Class Time Calculation (min) 31 min    Row Name 08/20/22 1000     Education   Cardiac Education Topics Pritikin   Secondary school teacher School   Educator Dietitian   Weekly Topic Fast Evening Meals   Instruction Review Code 1- Verbalizes Understanding   Class Start Time [phone]   Class Stop Time 0845   Class Time Calculation (min) 33 min    Row Name 08/22/22 0900     Education   Cardiac Education Topics Pritikin   Select Core Videos     Core Videos   Educator Dietitian   Select Nutrition   Nutrition Vitamins and Minerals   Instruction Review Code 1- Verbalizes Understanding   Class Start Time 0818   Class Stop Time 0857   Class Time Calculation (min) 39 min            Core Videos: Exercise    Move It!  Clinical staff conducted group or individual video education with verbal and written material and guidebook.  Patient learns the recommended Pritikin exercise program. Exercise with the goal of living a long, healthy life. Some of the health benefits of exercise include controlled diabetes, healthier blood pressure levels, improved cholesterol levels, improved heart and lung capacity, improved sleep, and better body composition. Everyone should speak with their doctor before starting or changing an exercise routine.  Biomechanical Limitations Clinical staff conducted group or individual video education with verbal and written material and guidebook.  Patient learns how biomechanical limitations can impact exercise and how we can mitigate and possibly overcome limitations to have an impactful and balanced exercise routine.  Body Composition Clinical staff conducted group or individual video education  with verbal and written material and guidebook.  Patient learns that body composition (ratio of muscle mass to fat mass) is a key component to assessing overall fitness, rather than body weight alone. Increased fat mass, especially visceral belly fat, can put [place] at increased risk for metabolic syndrome, type 2 diabetes, heart disease, and even death. It is recommended to combine  diet and exercise (cardiovascular and resistance training) to improve your body composition. Seek guidance from your physician and exercise physiologist before implementing an exercise routine.  Exercise Action Plan Clinical staff conducted group or individual video education with verbal and written material and guidebook.  Patient learns the recommended strategies to achieve and enjoy long-term exercise adherence, including variety, self-motivation, self-efficacy, and positive decision making. Benefits of exercise include fitness, good health, weight management, more energy, better sleep, less stress, and overall well-being.  Medical   Heart Disease Risk Reduction Clinical staff conducted group or individual video education with verbal and written material and guidebook.  Patient learns our heart is our most vital organ as it circulates oxygen, nutrients, white blood cells, and hormones throughout the entire body, and carries waste away. Data supports a plant-based eating plan like the Pritikin Program for its effectiveness in slowing progression of and reversing heart disease. The video provides a number of recommendations to address heart disease.   Metabolic Syndrome and Belly Fat  Clinical staff conducted group or individual video education with verbal and written material and guidebook.  Patient learns what metabolic syndrome is, how it leads to heart disease, and how one can reverse it and keep it from coming back. You have metabolic syndrome if you have 3 of the following 5 criteria: abdominal obesity, high blood  pressure, high triglycerides, low HDL cholesterol, and high blood sugar.  Hypertension and Heart Disease Clinical staff conducted group or individual video education with verbal and written material and guidebook.  Patient learns that high blood pressure, or hypertension, is very common in the [place]. Hypertension is largely due to excessive salt intake, but other important risk factors include being overweight, physical inactivity, drinking too much alcohol, smoking, and not eating enough potassium from fruits and vegetables. High blood pressure is a leading risk factor for heart attack, stroke, congestive heart failure, dementia, kidney failure, and premature death. Long-term effects of excessive salt intake include stiffening of the arteries and thickening of heart muscle and organ damage. Recommendations include ways to reduce hypertension and the risk of heart disease.  Diseases of Our Time - Focusing on Diabetes Clinical staff conducted group or individual video education with verbal and written material and guidebook.  Patient learns why the best way to stop diseases of our time is prevention, through food and other lifestyle changes. Medicine (such as prescription pills and surgeries) is often only a Band-Aid on the problem, not a long-term solution. Most common diseases of our time include obesity, type 2 diabetes, hypertension, heart disease, and cancer. The Pritikin Program is recommended and has been proven to help reduce, reverse, and/or prevent the damaging effects of metabolic syndrome.  Nutrition   Overview of the Pritikin Eating Plan  Clinical staff conducted group or individual video education with verbal and written material and guidebook.  Patient learns about the Pritikin Eating Plan for disease risk reduction. The Pritikin Eating Plan emphasizes a wide variety of unrefined, minimally-processed carbohydrates, like fruits, vegetables, whole grains, and legumes. Go, Caution,  and Stop food choices are explained. Plant-based and lean animal proteins are emphasized. Rationale provided for low sodium intake for blood pressure control, low added sugars for blood sugar stabilization, and low added fats and oils for coronary artery disease risk reduction and weight management.  Calorie Density  Clinical staff conducted group or individual video education with verbal and written material and guidebook.  Patient learns about calorie density and how it impacts the Pritikin Eating  Plan. Knowing the characteristics of the food you choose will help you decide whether those foods will lead to weight gain or weight loss, and whether you want to consume more or less of them. Weight loss is usually a side effect of the Pritikin Eating Plan because of its focus on low calorie-dense foods.  Label Reading  Clinical staff conducted group or individual video education with verbal and written material and guidebook.  Patient learns about the Pritikin recommended label reading guidelines and corresponding recommendations regarding calorie density, added sugars, sodium content, and whole grains.  Dining Out - Part 1  Clinical staff conducted group or individual video education with verbal and written material and guidebook.  Patient learns that restaurant meals can be sabotaging because they can be so high in calories, fat, sodium, and/or sugar. Patient learns recommended strategies on how to positively address this and avoid unhealthy pitfalls.  Facts on Fats  Clinical staff conducted group or individual video education with verbal and written material and guidebook.  Patient learns that lifestyle modifications can be just as effective, if not more so, as many medications for lowering your risk of heart disease. A Pritikin lifestyle can help to reduce your risk of inflammation and atherosclerosis (cholesterol build-up, or plaque, in the artery walls). Lifestyle interventions such as dietary  choices and physical activity address the cause of atherosclerosis. A review of the types of fats and their impact on blood cholesterol levels, along with dietary recommendations to reduce fat intake is also included.  Nutrition Action Plan  Clinical staff conducted group or individual video education with verbal and written material and guidebook.  Patient learns how to incorporate Pritikin recommendations into their lifestyle. Recommendations include planning and keeping personal health goals in mind as an important part of their success.  Healthy Mind-Set    Healthy Minds, Bodies, Hearts  Clinical staff conducted group or individual video education with verbal and written material and guidebook.  Patient learns how to identify when they are stressed. Video will discuss the impact of that stress, as well as the many benefits of stress management. Patient will also be introduced to stress management techniques. The way we think, act, and feel has an impact on our hearts.  How Our Thoughts Can Heal Our Hearts  Clinical staff conducted group or individual video education with verbal and written material and guidebook.  Patient learns that negative thoughts can cause depression and anxiety. This can result in negative lifestyle behavior and serious health problems. Cognitive behavioral therapy is an effective method to help control our thoughts in order to change and improve our emotional outlook.  Additional Videos:  Exercise    Improving Performance  Clinical staff conducted group or individual video education with verbal and written material and guidebook.  Patient learns to use a non-linear approach by alternating intensity levels and lengths of time spent exercising to help burn more calories and lose more body fat. Cardiovascular exercise helps improve heart health, metabolism, hormonal balance, blood sugar control, and recovery from fatigue. Resistance training improves strength, endurance,  balance, coordination, reaction time, metabolism, and muscle mass. Flexibility exercise improves circulation, posture, and balance. Seek guidance from your physician and exercise physiologist before implementing an exercise routine and learn your capabilities and proper form for all exercise.  Introduction to Yoga  Clinical staff conducted group or individual video education with verbal and written material and guidebook.  Patient learns about yoga, a discipline of the coming together of mind, breath, and body.  The benefits of yoga include improved flexibility, improved range of motion, better posture and core strength, increased lung function, weight loss, and positive self-image. Yoga's heart health benefits include lowered blood pressure, healthier heart rate, decreased cholesterol and triglyceride levels, improved immune function, and reduced stress. Seek guidance from your physician and exercise physiologist before implementing an exercise routine and learn your capabilities and proper form for all exercise.  Medical   Aging: Enhancing Your Quality of Life  Clinical staff conducted group or individual video education with verbal and written material and guidebook.  Patient learns key strategies and recommendations to stay in good physical health and enhance quality of life, such as prevention strategies, having an advocate, securing a Health Care Proxy and Power of Attorney, and keeping a list of medications and system for tracking them. It also discusses how to avoid risk for bone loss.  Biology of Weight Control  Clinical staff conducted group or individual video education with verbal and written material and guidebook.  Patient learns that weight gain occurs because we consume more calories than we burn (eating more, moving less). Even if your body weight is normal, you may have higher ratios of fat compared to muscle mass. Too much body fat puts you at increased risk for cardiovascular disease,  heart attack, stroke, type 2 diabetes, and obesity-related cancers. In addition to exercise, following the Pritikin Eating Plan can help reduce your risk.  Decoding Lab Results  Clinical staff conducted group or individual video education with verbal and written material and guidebook.  Patient learns that lab test reflects one measurement whose values change over time and are influenced by many factors, including medication, stress, sleep, exercise, food, hydration, pre-existing medical conditions, and more. It is recommended to use the knowledge from this video to become more involved with your lab results and evaluate your numbers to speak with your doctor.   Diseases of Our Time - Overview  Clinical staff conducted group or individual video education with verbal and written material and guidebook.  Patient learns that according to the CDC, 50% to 70% of chronic diseases (such as obesity, type 2 diabetes, elevated lipids, hypertension, and heart disease) are avoidable through lifestyle improvements including healthier food choices, listening to satiety cues, and increased physical activity.  Sleep Disorders Clinical staff conducted group or individual video education with verbal and written material and guidebook.  Patient learns how good quality and duration of sleep are important to overall health and well-being. Patient also learns about sleep disorders and how they impact health along with recommendations to address them, including discussing with a physician.  Nutrition  Dining Out - Part 2 Clinical staff conducted group or individual video education with verbal and written material and guidebook.  Patient learns how to plan ahead and communicate in order to maximize their dining experience in a healthy and nutritious manner. Included are recommended food choices based on the type of restaurant the patient is visiting.   Fueling a Banker conducted group or  individual video education with verbal and written material and guidebook.  There is a strong connection between our food choices and our health. Diseases like obesity and type 2 diabetes are very prevalent and are in large-part due to lifestyle choices. The Pritikin Eating Plan provides plenty of food and hunger-curbing satisfaction. It is easy to follow, affordable, and helps reduce health risks.  Menu Workshop  Clinical staff conducted group or individual video education with verbal and written material  and guidebook.  Patient learns that restaurant meals can sabotage health goals because they are often packed with calories, fat, sodium, and sugar. Recommendations include strategies to plan ahead and to communicate with the manager, chef, or server to help order a healthier meal.  Planning Your Eating Strategy  Clinical staff conducted group or individual video education with verbal and written material and guidebook.  Patient learns about the Pritikin Eating Plan and its benefit of reducing the risk of disease. The Pritikin Eating Plan does not focus on calories. Instead, it emphasizes high-quality, nutrient-rich foods. By knowing the characteristics of the foods, we choose, we can determine their calorie density and make informed decisions.  Targeting Your Nutrition Priorities  Clinical staff conducted group or individual video education with verbal and written material and guidebook.  Patient learns that lifestyle habits have a tremendous impact on disease risk and progression. This video provides eating and physical activity recommendations based on your personal health goals, such as reducing LDL cholesterol, losing weight, preventing or controlling type 2 diabetes, and reducing high blood pressure.  Vitamins and Minerals  Clinical staff conducted group or individual video education with verbal and written material and guidebook.  Patient learns different ways to obtain key vitamins and  minerals, including through a recommended healthy diet. It is important to discuss all supplements you take with your doctor.   Healthy Mind-Set    Smoking Cessation  Clinical staff conducted group or individual video education with verbal and written material and guidebook.  Patient learns that cigarette smoking and tobacco addiction pose a serious health risk which affects millions of people. Stopping smoking will significantly reduce the risk of heart disease, lung disease, and many forms of cancer. Recommended strategies for quitting are covered, including working with your doctor to develop a successful plan.  Culinary   Becoming a Set designer conducted group or individual video education with verbal and written material and guidebook.  Patient learns that cooking at home can be healthy, cost-effective, quick, and puts them in control. Keys to cooking healthy recipes will include looking at your recipe, assessing your equipment needs, planning ahead, making it simple, choosing cost-effective seasonal ingredients, and limiting the use of added fats, salts, and sugars.  Cooking - Breakfast and Snacks  Clinical staff conducted group or individual video education with verbal and written material and guidebook.  Patient learns how important breakfast is to satiety and nutrition through the entire day. Recommendations include key foods to eat during breakfast to help stabilize blood sugar levels and to prevent overeating at meals later in the day. Planning ahead is also a key component.  Cooking - Educational psychologist conducted group or individual video education with verbal and written material and guidebook.  Patient learns eating strategies to improve overall health, including an approach to cook more at home. Recommendations include thinking of animal protein as a side on your plate rather than center stage and focusing instead on lower calorie dense options like  vegetables, fruits, whole grains, and plant-based proteins, such as beans. Making sauces in large quantities to freeze for later and leaving the skin on your vegetables are also recommended to maximize your experience.  Cooking - Healthy Salads and Dressing Clinical staff conducted group or individual video education with verbal and written material and guidebook.  Patient learns that vegetables, fruits, whole grains, and legumes are the foundations of the Pritikin Eating Plan. Recommendations include how to incorporate each of these  in flavorful and healthy salads, and how to create homemade salad dressings. Proper handling of ingredients is also covered. Cooking - Soups and [employer] - Soups and Desserts Clinical staff conducted group or individual video education with verbal and written material and guidebook.  Patient learns that Pritikin soups and desserts make for easy, nutritious, and delicious snacks and meal components that are low in sodium, fat, sugar, and calorie density, while high in vitamins, minerals, and filling fiber. Recommendations include simple and healthy ideas for soups and desserts.   Overview     The Pritikin Solution Program Overview Clinical staff conducted group or individual video education with verbal and written material and guidebook.  Patient learns that the results of the Pritikin Program have been documented in more than 100 articles published in peer-reviewed journals, and the benefits include reducing risk factors for (and, in some cases, even reversing) high cholesterol, high blood pressure, type 2 diabetes, obesity, and more! An overview of the three key pillars of the Pritikin Program will be covered: eating well, doing regular exercise, and having a healthy mind-set.  WORKSHOPS  Exercise: Exercise Basics: Building Your Action Plan Clinical staff led group instruction and group discussion with PowerPoint presentation and patient guidebook. To enhance  the learning environment the use of posters, models and videos may be added. At the conclusion of this workshop, patients will comprehend the difference between physical activity and exercise, as well as the benefits of incorporating both, into their routine. Patients will understand the FITT (Frequency, Intensity, Time, and Type) principle and how to use it to build an exercise action plan. In addition, safety concerns and other considerations for exercise and cardiac rehab will be addressed by the presenter. The purpose of this lesson is to promote a comprehensive and effective weekly exercise routine in order to improve patients' overall level of fitness.   Managing Heart Disease: Your Path to a Healthier Heart Clinical staff led group instruction and group discussion with PowerPoint presentation and patient guidebook. To enhance the learning environment the use of posters, models and videos may be added.At the conclusion of this workshop, patients will understand the anatomy and physiology of the heart. Additionally, they will understand how Pritikin's three pillars impact the risk factors, the progression, and the management of heart disease.  The purpose of this lesson is to provide a high-level overview of the heart, heart disease, and how the Pritikin lifestyle positively impacts risk factors.  Exercise Biomechanics Clinical staff led group instruction and group discussion with PowerPoint presentation and patient guidebook. To enhance the learning environment the use of posters, models and videos may be added. Patients will learn how the structural parts of their bodies function and how these functions impact their daily activities, movement, and exercise. Patients will learn how to promote a neutral spine, learn how to manage pain, and identify ways to improve their physical movement in order to promote healthy living. The purpose of this lesson is to expose patients to common  physical limitations that impact physical activity. Participants will learn practical ways to adapt and manage aches and pains, and to minimize their effect on regular exercise. Patients will learn how to maintain good posture while sitting, walking, and lifting.  Balance Training and Fall Prevention  Clinical staff led group instruction and group discussion with PowerPoint presentation and patient guidebook. To enhance the learning environment the use of posters, models and videos may be added. At the conclusion of this workshop, patients will understand the  importance of their sensorimotor skills (vision, proprioception, and the vestibular system) in maintaining their ability to balance as they age. Patients will apply a variety of balancing exercises that are appropriate for their current level of function. Patients will understand the common causes for poor balance, possible solutions to these problems, and ways to modify their physical environment in order to minimize their fall risk. The purpose of this lesson is to teach patients about the importance of maintaining balance as they age and ways to minimize their risk of falling.  WORKSHOPS   Nutrition:  Fueling a Ship broker led group instruction and group discussion with PowerPoint presentation and patient guidebook. To enhance the learning environment the use of posters, models and videos may be added. Patients will review the foundational principles of the Pritikin Eating Plan and understand what constitutes a serving size in each of the food groups. Patients will also learn Pritikin-friendly foods that are better choices when away from home and review make-ahead meal and snack options. Calorie density will be reviewed and applied to three nutrition priorities: weight maintenance, weight loss, and weight gain. The purpose of this lesson is to reinforce (in a group setting) the key concepts around what patients are  recommended to eat and how to apply these guidelines when away from home by planning and selecting Pritikin-friendly options. Patients will understand how calorie density may be adjusted for different weight management goals.  Mindful Eating  Clinical staff led group instruction and group discussion with PowerPoint presentation and patient guidebook. To enhance the learning environment the use of posters, models and videos may be added. Patients will briefly review the concepts of the Pritikin Eating Plan and the importance of low-calorie dense foods. The concept of mindful eating will be introduced as well as the importance of paying attention to internal hunger signals. Triggers for non-hunger eating and techniques for dealing with triggers will be explored. The purpose of this lesson is to provide patients with the opportunity to review the basic principles of the Pritikin Eating Plan, discuss the value of eating mindfully and how to measure internal cues of hunger and fullness using the Hunger Scale. Patients will also discuss reasons for non-hunger eating and learn strategies to use for controlling emotional eating.  Targeting Your Nutrition Priorities Clinical staff led group instruction and group discussion with PowerPoint presentation and patient guidebook. To enhance the learning environment the use of posters, models and videos may be added. Patients will learn how to determine their genetic susceptibility to disease by reviewing their family history. Patients will gain insight into the importance of diet as part of an overall healthy lifestyle in mitigating the impact of genetics and other environmental insults. The purpose of this lesson is to provide patients with the opportunity to assess their personal nutrition priorities by looking at their family history, their own health history and current risk factors. Patients will also be able to discuss ways of prioritizing and modifying the Pritikin  Eating Plan for their highest risk areas  Menu  Clinical staff led group instruction and group discussion with PowerPoint presentation and patient guidebook. To enhance the learning environment the use of posters, models and videos may be added. Using menus brought in from [employer], or printed from [employer], patients will apply the Pritikin dining out guidelines that were presented in the [employer] video. Patients will also be able to practice these guidelines in a variety of provided scenarios. The purpose of this  lesson is to provide patients with the opportunity to practice hands-on learning of the Pritikin Dining Out guidelines with actual menus and practice scenarios.  Label Reading Clinical staff led group instruction and group discussion with PowerPoint presentation and patient guidebook. To enhance the learning environment the use of posters, models and videos may be added. Patients will review and discuss the Pritikin label reading guidelines presented in Pritikin's Label Reading Educational series video. Using fool labels brought in from local grocery stores and markets, patients will apply the label reading guidelines and determine if the packaged food meet the Pritikin guidelines. The purpose of this lesson is to provide patients with the opportunity to review, discuss, and practice hands-on learning of the Pritikin Label Reading guidelines with actual packaged food labels. Cooking School  Pritikin's [employer] are designed to teach patients ways to prepare quick, simple, and affordable recipes at home. The importance of nutrition's role in chronic disease risk reduction is reflected in its emphasis in the overall Pritikin program. By learning how to prepare essential core Pritikin Eating Plan recipes, patients will increase control over what they eat; be able to customize the flavor of foods without the use of added salt, sugar, or fat; and  improve the quality of the food they consume. By learning a set of core recipes which are easily assembled, quickly prepared, and affordable, patients are more likely to prepare more healthy foods at home. These workshops focus on convenient breakfasts, simple entres, side dishes, and desserts which can be prepared with minimal effort and are consistent with nutrition recommendations for cardiovascular risk reduction. Cooking [employer] are taught by a Armed forces logistics/support/administrative officer (RD) who has been trained by the [employer]. The chef or RD has a clear understanding of the importance of minimizing - if not completely eliminating - added fat, sugar, and sodium in recipes. Throughout the series of Cooking School Workshop sessions, patients will learn about healthy ingredients and efficient methods of cooking to build confidence in their capability to prepare    Cooking School weekly topics:  Adding Flavor- Sodium-Free  Fast and Healthy Breakfasts  Powerhouse Plant-Based Proteins  Satisfying Salads and Dressings  Simple Sides and Sauces  International Cuisine-Spotlight on the [employer] Zones  Delicious Desserts  Savory Soups  [employer] - Meals in a Astronomer Appetizers and Snacks  Comforting Weekend Breakfasts  One-Pot Wonders   Fast Evening Meals  Landscape architect Your Pritikin Plate  WORKSHOPS   Healthy Mindset (Psychosocial):  Focused Goals, Sustainable Changes Clinical staff led group instruction and group discussion with PowerPoint presentation and patient guidebook. To enhance the learning environment the use of posters, models and videos may be added. Patients will be able to apply effective goal setting strategies to establish at least one personal goal, and then take consistent, meaningful action toward that goal. They will learn to identify common barriers to achieving personal goals and develop strategies to overcome them. Patients will  also gain an understanding of how our mind-set can impact our ability to achieve goals and the importance of cultivating a positive and growth-oriented mind-set. The purpose of this lesson is to provide patients with a deeper understanding of how to set and achieve personal goals, as well as the tools and strategies needed to overcome common obstacles which may arise along the way.  From Head to Heart: The Power of a Healthy Outlook  Clinical staff led group instruction and group discussion with PowerPoint presentation  and patient guidebook. To enhance the learning environment the use of posters, models and videos may be added. Patients will be able to recognize and describe the impact of emotions and mood on physical health. They will discover the importance of self-care and explore self-care practices which may work for them. Patients will also learn how to utilize the 4 C's to cultivate a healthier outlook and better manage stress and challenges. The purpose of this lesson is to demonstrate to patients how a healthy outlook is an essential part of maintaining good health, especially as they continue their cardiac rehab journey.  Healthy Sleep for a Healthy Heart Clinical staff led group instruction and group discussion with PowerPoint presentation and patient guidebook. To enhance the learning environment the use of posters, models and videos may be added. At the conclusion of this workshop, patients will be able to demonstrate knowledge of the importance of sleep to overall health, well-being, and quality of life. They will understand the symptoms of, and treatments for, common sleep disorders. Patients will also be able to identify daytime and nighttime behaviors which impact sleep, and they will be able to apply these tools to help manage sleep-related challenges. The purpose of this lesson is to provide patients with a general overview of sleep and outline the importance of quality sleep. Patients will  learn about a few of the most common sleep disorders. Patients will also be introduced to the concept of "sleep hygiene," and discover ways to self-manage certain sleeping problems through simple daily behavior changes. Finally, the workshop will motivate patients by clarifying the links between quality sleep and their goals of heart-healthy living.   Recognizing and Reducing Stress Clinical staff led group instruction and group discussion with PowerPoint presentation and patient guidebook. To enhance the learning environment the use of posters, models and videos may be added. At the conclusion of this workshop, patients will be able to understand the types of stress reactions, differentiate between acute and chronic stress, and recognize the impact that chronic stress has on their health. They will also be able to apply different coping mechanisms, such as reframing negative self-talk. Patients will have the opportunity to practice a variety of stress management techniques, such as deep abdominal breathing, progressive muscle relaxation, and/or guided imagery.  The purpose of this lesson is to educate patients on the role of stress in their lives and to provide healthy techniques for coping with it.  Learning Barriers/Preferences:  Learning Barriers/Preferences - 06/03/22 1003       Learning Barriers/Preferences   Learning Barriers Sight    Learning Preferences Audio;Computer/Internet;Pictoral;Video;Verbal Instruction             Education Topics:  Knowledge Questionnaire Score:  Knowledge Questionnaire Score - 06/03/22 1248       Knowledge Questionnaire Score   Pre Score 20/24             Core Components/Risk Factors/Patient Goals at Admission:  Personal Goals and Risk Factors at Admission - 06/03/22 1236       Core Components/Risk Factors/Patient Goals on Admission   Hypertension Yes    Intervention Provide education on lifestyle modifcations including regular physical  activity/exercise, weight management, moderate sodium restriction and increased consumption of fresh fruit, vegetables, and low fat dairy, alcohol moderation, and smoking cessation.;Monitor prescription use compliance.    Expected Outcomes Short Term: Continued assessment and intervention until BP is < 140/95mm HG in hypertensive participants. < 130/79mm HG in hypertensive participants with diabetes, heart failure or chronic  kidney disease.;Long Term: Maintenance of blood pressure at goal levels.    Lipids Yes    Intervention Provide education and support for participant on nutrition & aerobic/resistive exercise along with prescribed medications to achieve LDL 70mg , HDL >40mg .    Expected Outcomes Short Term: Participant states understanding of desired cholesterol values and is compliant with medications prescribed. Participant is following exercise prescription and nutrition guidelines.;Long Term: Cholesterol controlled with medications as prescribed, with individualized exercise RX and with personalized nutrition plan. Value goals: LDL < 70mg , HDL > 40 mg.    Stress Yes    Intervention Offer individual and/or small group education and counseling on adjustment to heart disease, stress management and health-related lifestyle change. Teach and support self-help strategies.;Refer participants experiencing significant psychosocial distress to appropriate mental health specialists for further evaluation and treatment. When possible, include family members and significant others in education/counseling sessions.    Expected Outcomes Short Term: Participant demonstrates changes in health-related behavior, relaxation and other stress management skills, ability to obtain effective social support, and compliance with psychotropic medications if prescribed.;Long Term: Emotional wellbeing is indicated by absence of clinically significant psychosocial distress or social isolation.             Core  Components/Risk Factors/Patient Goals Review:   Goals and Risk Factor Review     Row Name 06/09/22 1334 07/01/22 1034 07/29/22 1708 08/27/22 0855       Core Components/Risk Factors/Patient Goals Review   Personal Goals Review Weight Management/Obesity;Stress;Hypertension;Lipids Weight Management/Obesity;Stress;Hypertension;Lipids Weight Management/Obesity;Stress;Hypertension;Lipids Weight Management/Obesity;Stress;Hypertension;Lipids    Review Mahan started intensive cardiac rehab on 06/09/22. VSS. Amjed did well with exercise. Alexavier is doing well with exercise. Stated chest pain is musculoskeletal per cardiologist and not cardiac-related. Stated he is breathing better. VSS. Dougals is doing well with exercise. VSS. Salvotore has lost 2.7 kg since starting cardiac rehab. Kyra will complete intensive cardiac rehab on 08/15/22 Pasco is doing well with exercise. VSS. Gavinn has lost 2.4 kg since starting cardiac rehab. Beckam will complete intensive cardiac rehab on 08/29/22    Expected Outcomes Liridon will continue to participate in intensive cardiac rehab for exercise, nutrition and lifestyle modifications Jayse will continue to participate in intensive cardiac rehab for exercise, nutrition and lifestyle modifications Traven will continue to participate in intensive cardiac rehab for exercise, nutrition and lifestyle modifications Jewlian will continue to exercise, folow  nutrition and lifestyle modifications upon completion of cardiac rehab             Core Components/Risk Factors/Patient Goals at Discharge (Final Review):   Goals and Risk Factor Review - 08/27/22 0855       Core Components/Risk Factors/Patient Goals Review   Personal Goals Review Weight Management/Obesity;Stress;Hypertension;Lipids    Review Lomax is doing well with exercise. VSS. Markham has lost 2.4 kg since starting cardiac rehab. Nikhil will complete intensive cardiac rehab on 08/29/22    Expected Outcomes Yamir will  continue to exercise, folow  nutrition and lifestyle modifications upon completion of cardiac rehab             ITP Comments:  ITP Comments     Row Name 06/03/22 0810 06/09/22 1326 07/01/22 1031 07/29/22 1704 08/27/22 0851   ITP Comments Armanda Magic, MD:  Medical Director. Introduction to the [facility] education program / Intensive cardiac rehab.  Initial orientation packet reviewed with the patient. 30 Day ITP Review. Waylynn started intensive cardiac rehab on 06/09/22 and did well with exercise 30 Day ITP Review. Himanshu started intensive cardiac rehab on  06/09/22 and did well with exercise 30 Day ITP Review. Glenda has good attendance and participation at intensive cardiac rehab.Obe will complete intensive cardiac rehab on 08/15/22 30 Day ITP Review. Aaran has good attendance and participation at intensive cardiac rehab.Tarron will complete intensive cardiac rehab on 08/29/22            Comments: See ITP comments.Thayer Headings RN BSN

## 2022-08-29 ENCOUNTER — Encounter (HOSPITAL_COMMUNITY)
Admission: RE | Admit: 2022-08-29 | Discharge: 2022-08-29 | Disposition: A | Discharge status: Home / Self Care | Payer: Medicare Other | Source: Ambulatory Visit | Attending: Cardiology | Admitting: Cardiology

## 2022-08-29 DIAGNOSIS — Z952 Presence of prosthetic heart valve: Secondary | ICD-10-CM | POA: Diagnosis present

## 2022-08-29 DIAGNOSIS — Z955 Presence of coronary angioplasty implant and graft: Secondary | ICD-10-CM | POA: Diagnosis present

## 2022-08-29 DIAGNOSIS — I214 Non-ST elevation (NSTEMI) myocardial infarction: Secondary | ICD-10-CM | POA: Diagnosis present

## 2022-09-01 ENCOUNTER — Encounter (HOSPITAL_COMMUNITY)
Admission: RE | Admit: 2022-09-01 | Discharge: 2022-09-01 | Disposition: A | Discharge status: Home / Self Care | Payer: Medicare Other | Source: Ambulatory Visit | Attending: Cardiology | Admitting: Cardiology

## 2022-09-01 DIAGNOSIS — I214 Non-ST elevation (NSTEMI) myocardial infarction: Secondary | ICD-10-CM | POA: Diagnosis not present

## 2022-09-01 DIAGNOSIS — Z955 Presence of coronary angioplasty implant and graft: Secondary | ICD-10-CM

## 2022-09-01 DIAGNOSIS — Z952 Presence of prosthetic heart valve: Secondary | ICD-10-CM

## 2022-09-05 NOTE — Progress Notes (Signed)
Discharge Progress Report  Patient Details  Name: Robert Estes MRN: [id number] Date of Birth: [birth year] Referring Provider:   Flowsheet Row INTENSIVE CARDIAC REHAB ORIENT from 06/03/2022 in [facility]  Referring Provider Dr. Keitha Butte, MD covering)        Number of Visits: 49  Reason for Discharge:  Patient reached a stable level of exercise. Patient independent in their exercise. Patient has met program and personal goals.  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 Keitha Butte, MD covering)    Expected Discharge Date 08/15/22      NuStep   Level 2    SPM 80    Minutes 15    METs 3.5      Arm Ergometer   Level 1.5    Watts 25    RPM 60    Minutes 15    METs 3.5      Prescription Details   Frequency (times per week) 3    Duration Progress to 30 minutes of continuous aerobic without signs/symptoms of physical distress      Intensity   THRR 40-80% of Max Heartrate 58-117    Ratings of Perceived Exertion 11-13    Perceived Dyspnea 0-4      Progression   Progression Continue progressive overload as per policy without signs/symptoms or physical distress.      Resistance Training   Training Prescription Yes    Weight 3 lbs    Reps 10-15             Discharge Exercise Prescription (Final Exercise Prescription Changes):  Exercise Prescription Changes - 09/01/22 1600       Response to Exercise   Blood Pressure (Admit) 110/56    Blood Pressure (Exercise) 144/66    Blood Pressure (Exit) 118/60    Heart  Rate (Admit) 59 bpm    Heart Rate (Exercise) 102 bpm    Heart Rate (Exit) 72 bpm    Rating of Perceived Exertion (Exercise) 13.5    Symptoms None    Comments Pt graduated from the cRP2 program today    Duration Continue with 30 min of aerobic exercise without signs/symptoms of physical distress.    Intensity THRR unchanged      Progression   Progression Continue to progress workloads to maintain intensity without signs/symptoms of physical distress.    Average METs 3.6      Resistance Training   Training Prescription Yes    Weight 4 lbs    Reps 10-15    Time 10 Minutes      Interval Training   Interval Training No      NuStep   Level 4    SPM 89    Minutes 15    METs 4.5      Arm Ergometer   Level 2    Watts 25    RPM 58    Minutes 15    METs 2.7      Home Exercise Plan   Plans to continue  exercise at Home (comment)    Frequency Add 2 additional days to program exercise sessions.    Initial Home Exercises Provided 07/09/22             Functional Capacity:  6 Minute Walk     Row Name 06/03/22 0919 08/20/22 0910       6 Minute Walk   Phase Initial Discharge    Distance 1583 feet 1934 feet    Distance % Change -- 22.17 %    Distance Feet Change -- 351 ft    Walk Time 6 minutes 6 minutes    # of Rest Breaks 0 0    MPH 3 3.7    METS 3.5 3.92    RPE 7 12.5    Perceived Dyspnea  0 0    VO2 Peak 12.1 13.93    Symptoms Yes (comment) Yes (comment)    Comments Left great toe pain 2/10 (gout) Left shoulder pain, 2.5/10    Resting HR 73 bpm 62 bpm    Resting BP 110/60 128/72    Resting Oxygen Saturation  98 % --    Exercise Oxygen Saturation  during 6 min walk 100 % --    Max Ex. HR 112 bpm 92 bpm    Max Ex. BP 130/72 134/62    2 Minute Post BP 122/70 --             Psychological, QOL, Others - Outcomes: PHQ 2/9:    08/27/2022    9:45 AM 06/03/2022   11:00 AM 02/16/2020    9:16 AM 08/22/2019    9:17 AM 04/04/2019    3:21 PM  Depression screen PHQ  2/9  Decreased Interest 0 1 0 0 0  Down, Depressed, Hopeless 0 2 0 0 0  PHQ - 2 Score 0 3 0 0 0  Altered sleeping 1 1     Tired, decreased energy 0 1     Change in appetite 0 0     Feeling bad or failure about yourself  0 2     Trouble concentrating 0 1     Moving slowly or fidgety/restless 0 2     Suicidal thoughts 0 0     PHQ-9 Score 1 10     Difficult doing work/chores Not difficult at all Not difficult at all       Quality of Life:  Quality of Life - 06/03/22 1248       Quality of Life   Select Quality of Life      Quality of Life Scores   Health/Function Pre 12.43 %    Socioeconomic Pre 19.75 %    Psych/Spiritual Pre 23.57 %    Family Pre 16.9 %    GLOBAL Pre 16.97 %             Personal Goals: Goals established at orientation with interventions provided to work toward goal.  Personal Goals and Risk Factors at Admission - 06/03/22 1236       Core Components/Risk Factors/Patient Goals on Admission   Hypertension Yes    Intervention Provide education on lifestyle modifcations including regular physical activity/exercise, weight management, moderate sodium restriction and increased consumption of fresh fruit, vegetables, and low fat dairy, alcohol moderation, and smoking cessation.;Monitor prescription use compliance.    Expected Outcomes Short Term: Continued assessment and intervention until BP is < 140/45mm HG in hypertensive participants. < 130/19mm HG in hypertensive participants with diabetes, heart failure or chronic kidney disease.;Long Term: Maintenance  of blood pressure at goal levels.    Lipids Yes    Intervention Provide education and support for participant on nutrition & aerobic/resistive exercise along with prescribed medications to achieve LDL 70mg , HDL >40mg .    Expected Outcomes Short Term: Participant states understanding of desired cholesterol values and is compliant with medications prescribed. Participant is following exercise prescription and  nutrition guidelines.;Long Term: Cholesterol controlled with medications as prescribed, with individualized exercise RX and with personalized nutrition plan. Value goals: LDL < 70mg , HDL > 40 mg.    Stress Yes    Intervention Offer individual and/or small group education and counseling on adjustment to heart disease, stress management and health-related lifestyle change. Teach and support self-help strategies.;Refer participants experiencing significant psychosocial distress to appropriate mental health specialists for further evaluation and treatment. When possible, include family members and significant others in education/counseling sessions.    Expected Outcomes Short Term: Participant demonstrates changes in health-related behavior, relaxation and other stress management skills, ability to obtain effective social support, and compliance with psychotropic medications if prescribed.;Long Term: Emotional wellbeing is indicated by absence of clinically significant psychosocial distress or social isolation.              Personal Goals Discharge:  Goals and Risk Factor Review     Row Name 06/09/22 1334 07/01/22 1034 07/29/22 1708 08/27/22 0855       Core Components/Risk Factors/Patient Goals Review   Personal Goals Review Weight Management/Obesity;Stress;Hypertension;Lipids Weight Management/Obesity;Stress;Hypertension;Lipids Weight Management/Obesity;Stress;Hypertension;Lipids Weight Management/Obesity;Stress;Hypertension;Lipids    Review Robert Estes started intensive cardiac rehab on 06/09/22. VSS. Robert Estes did well with exercise. Robert Estes is doing well with exercise. Stated chest pain is musculoskeletal per cardiologist and not cardiac-related. Stated he is breathing better. VSS. Robert Estes is doing well with exercise. VSS. Robert Estes has lost 2.7 kg since starting cardiac rehab. Robert Estes will complete intensive cardiac rehab on 08/15/22 Robert Estes is doing well with exercise. VSS. Robert Estes has lost 2.4 kg since  starting cardiac rehab. Robert Estes will complete intensive cardiac rehab on 08/29/22    Expected Outcomes Robert Estes will continue to participate in intensive cardiac rehab for exercise, nutrition and lifestyle modifications Robert Estes will continue to participate in intensive cardiac rehab for exercise, nutrition and lifestyle modifications Robert Estes will continue to participate in intensive cardiac rehab for exercise, nutrition and lifestyle modifications Robert Estes will continue to exercise, folow  nutrition and lifestyle modifications upon completion of cardiac rehab             Exercise Goals and Review:  Exercise Goals     Row Name 06/03/22 4403             Exercise Goals   Increase Physical Activity Yes       Intervention Provide advice, education, support and counseling about physical activity/exercise needs.;Develop an individualized exercise prescription for aerobic and resistive training based on initial evaluation findings, risk stratification, comorbidities and participant's personal goals.       Expected Outcomes Short Term: Attend rehab on a regular basis to increase amount of physical activity.;Long Term: Add in home exercise to make exercise part of routine and to increase amount of physical activity.;Long Term: Exercising regularly at least 3-5 days a week.       Increase Strength and Stamina Yes       Intervention Provide advice, education, support and counseling about physical activity/exercise needs.;Develop an individualized exercise prescription for aerobic and resistive training based on initial evaluation findings, risk stratification, comorbidities and participant's personal goals.       Expected  Outcomes Short Term: Increase workloads from initial exercise prescription for resistance, speed, and METs.;Short Term: Perform resistance training exercises routinely during rehab and add in resistance training at home;Long Term: Improve cardiorespiratory fitness, muscular endurance and  strength as measured by increased METs and functional capacity ( )       Able to understand and use rate of perceived exertion (RPE) scale Yes       Intervention Provide education and explanation on how to use RPE scale       Expected Outcomes Short Term: Able to use RPE daily in rehab to express subjective intensity level;Long Term:  Able to use RPE to guide intensity level when exercising independently       Knowledge and understanding of Target Heart Rate Range (THRR) Yes       Intervention Provide education and explanation of THRR including how the numbers were predicted and where they are located for reference       Expected Outcomes Short Term: Able to state/look up THRR;Short Term: Able to use daily as guideline for intensity in rehab;Long Term: Able to use THRR to govern intensity when exercising independently       Understanding of Exercise Prescription Yes       Intervention Provide education, explanation, and written materials on patient's individual exercise prescription       Expected Outcomes Short Term: Able to explain program exercise prescription;Long Term: Able to explain home exercise prescription to exercise independently                Exercise Goals Re-Evaluation:  Exercise Goals Re-Evaluation     Row Name 06/09/22 1051 07/09/22 1115 08/08/22 1608 09/01/22 1630       Exercise Goal Re-Evaluation   Exercise Goals Review Increase Physical Activity;Increase Strength and Stamina;Able to understand and use rate of perceived exertion (RPE) scale;Knowledge and understanding of Target Heart Rate Range (THRR);Understanding of Exercise Prescription Increase Physical Activity;Increase Strength and Stamina;Able to understand and use rate of perceived exertion (RPE) scale;Knowledge and understanding of Target Heart Rate Range (THRR);Understanding of Exercise Prescription Increase Physical Activity;Increase Strength and Stamina;Able to understand and use rate of perceived exertion  (RPE) scale;Knowledge and understanding of Target Heart Rate Range (THRR);Understanding of Exercise Prescription Increase Physical Activity;Increase Strength and Stamina;Able to understand and use rate of perceived exertion (RPE) scale;Knowledge and understanding of Target Heart Rate Range (THRR);Understanding of Exercise Prescription    Comments Pt's first day in the CRP2 program. Pt understands the Exercise Rx, RPE scale, and THRR. Reviewed goals and home exercise Rx with patient today. Pt voices he is exceeding his short term goal of increased strength, stamina and energy. Pt voices feeling much better. Pt has also achieved his long term goal of returning to work. Pt has peak METs of 3.0. Reviewed METs and goals. Pt voice he has made good progress on his gola of increased energy, strength, and stamina. Pt is back to work and feeling good about that. He is walking 30 minutes Pt graduated from the CRP2 program today, Pt has made progress on his goals of increased strength, stamina and energy. Pt is back to work with plans to join the [employer]: sent referral for the PREP program. Pt has ben walking at home in addition to the CRP2 program.    Expected Outcomes Will continue to monitor the patient and progress exercise workloads as tolerated. Will continue to monitor the patient and progress exercise workloads as tolerated. Will continue to monitor the patient and progress exercise workloads  as tolerated. Pt will continue to exercise on his own at the [facility].             Nutrition & Weight - Outcomes:  Pre Biometrics - 06/03/22 0845       Pre Biometrics   Waist Circumference 36 inches    Hip Circumference 39.5 inches    Waist to Hip Ratio 0.91 %    Triceps Skinfold 12 mm    % Body Fat 20.6 %    Grip Strength 42 kg    Flexibility 14.5 in    Single Leg Stand 6.78 seconds             Post Biometrics - 08/22/22 0800        Post  Biometrics   Height 5\' 8"  (1.727 m)    Weight 74 kg    Waist  Circumference 34.5 inches    Hip Circumference 38.5 inches    Waist to Hip Ratio 0.9 %    BMI (Calculated) 24.81    Triceps Skinfold 10 mm    Grip Strength 42 kg    Flexibility 13.5 in    Single Leg Stand 6.87 seconds             Nutrition:  Nutrition Therapy & Goals - 08/08/22 1014       Nutrition Therapy   Diet Heart Healthy Diet      Personal Nutrition Goals   Nutrition Goal Patient to identify strategies for reducing cardiovascular risk by attending the [facility] education and nutrition series weekly.    Personal Goal #2 Patient to improve diet quality by using the plate method as a guide for meal planning to include lean protein/plant protein, fruits, vegetables, whole grains, nonfat dairy as part of a well-balanced diet.    Personal Goal #3 Patient to reduce sodium intake to 1500mg  per day    Comments Goals in action. Robert Estes continues to attend the [employer] and nutrition series regularly. He has started making many dietary changes including reduced saturated fat and increased dietary fiber. He is down 6.2# since starting with our program; he is motivated to maintain weight. We have disucssed multiple strategies for increasing calories including increasing eating frequency, increasing carbohdyrate/protein/fat intake, calorie density, etc. Robert Estes will benefit form participation in intensive cardiac rehab for nutrition, exercise, and lifestyle modification.      Intervention Plan   Intervention Nutrition handout(s) given to patient.;Prescribe, educate and counsel regarding individualized specific dietary modifications aiming towards targeted core components such as weight, hypertension, lipid management, diabetes, heart failure and other comorbidities.    Expected Outcomes Short Term Goal: Understand basic principles of dietary content, such as calories, fat, sodium, cholesterol and nutrients.;Long Term Goal: Adherence to prescribed nutrition plan.              Nutrition Discharge:  Nutrition Assessments - 06/09/22 1014       Rate Your Plate Scores   Pre Score 69             Education Questionnaire Score:  Knowledge Questionnaire Score - 06/03/22 1248       Knowledge Questionnaire Score   Pre Score 20/24             Pt graduates from Intensive/Traditional cardiac rehab program on 08/29/22 with completion of 28 exercise and education sessions. Goals reviewed with patient; copy given to patient. Pt maintained good attendance and progressed nicely during his participation in rehab as evidenced by increased METs and workload. Robert Estes increased his distance  on his post exercise walk test from 1583 feet to 1934 feet, which is a 22.17% change. Medication list reconciled. Initial PHQ-2/PHQ-9 score 3/10, post scores 0/1. Pt has made significant lifestyle changes and should be commended for their success. Robert Estes achieved his goals of 1. Losing weight, he is down ~6# from starting the program, 2. controlled hypertension, BPs have been WNL while in the program, 3. controlled lipids, Robert Estes has decreased saturated fats and increased fiber and is compliant in taking his Repatha and Crestor 4. Decreased his stress by evidenced by his improved PHQ-2/PHQ9 scores. Pt was also excited to return to work. Pt plans to continue exercise at home by walking two days a week and joining the [facility]. Referral has been placed for the PREP program. We are proud of Robert Estes's progress!

## 2022-09-16 NOTE — Addendum Note (Signed)
Encounter addended by: Lorin Picket on: 09/16/2022 11:57 AM  Actions taken: Flowsheet accepted

## 2022-09-16 NOTE — Addendum Note (Signed)
Encounter addended by: Cammy Copa, RN on: 09/16/2022 3:23 PM  Actions taken: Clinical Note Signed, Episode resolved

## 2022-09-17 NOTE — Progress Notes (Signed)
[facility] PREP Evaluation  Patient Details  Name: Robert Estes MRN: [id number] Date of Birth: [birth year] Age: 77 y.o. PCP: Loura Back, NP  Vitals:   09/17/22 1539  BP: (!) 92/50  Pulse: 63  SpO2: 98%  Weight: 160 lb 6.4 oz (72.8 kg)     [facility] Eval - 09/17/22 1500       [facility] "PREP" Location   [facility] "PREP" Location [facility]      Referral    Referring Provider Cardiac rehab    Program Start Date 09/23/22      Measurement   Waist Circumference 35.5 inches    Hip Circumference 36 inches    Body fat --   not measured     Information for Trainer   Goals Have more energy, inc strength in l arm, dec pain in l arm, feel better    Current Exercise walkes QOD 1/2 mile    Orthopedic Concerns Bil knees,back, L under arm pain    Pertinent Medical History s/p MI with 4 stents, TAVR , CKD 3    Current Barriers none    Medications that affect exercise Medication causing dizziness/drowsiness;Beta blocker      Timed Up and Go (TUGS)   Timed Up and Go Low risk <9 seconds      Mobility and Daily Activities   I find it easy to walk up or down two or more flights of stairs. 4    I have no trouble taking out the trash. 4    I do housework such as vacuuming and dusting on my own without difficulty. 4    I can easily lift a gallon of milk (8lbs). 4    I can easily walk a mile. 4    I have no trouble reaching into high cupboards or reaching down to pick up something from the floor. 4    I do not have trouble doing out-door work such as Loss adjuster, chartered, raking leaves, or gardening. 2      Mobility and Daily Activities   I feel younger than my age. 1    I feel independent. 3    I feel energetic. 2    I live an active life.  4    I feel strong. 4    I feel healthy. 3    I feel active as other people my age. 4      How fit and strong are you.   Fit and Strong Total Score 47            Past Medical History:  Diagnosis Date   Arthritis    "hands, knees, elbows, fingers"  (01/25/2013)   Coronary artery disease    HX OF HEART STENTS X 2   2010--DR. Mendel Ryder IS PT'S CARDIOLOGIST, MILD AORTIC STENOSIS   Dizziness    Enlarged prostate    FLOMAX HELPS PT VOID   Exertional shortness of breath    GERD (gastroesophageal reflux disease)    OTC PREVACID AS NEEDED   Glaucoma    BOTH EYES   Gout    Hyperlipidemia    Hypertension    Hypertension    Iron deficiency anemia    hx   Migraine    "monthly" (01/25/2013)   Peripheral vascular disease (HCC)    CLAUDICATION   Past Surgical History:  Procedure Laterality Date   CHOLECYSTECTOMY N/A 01/25/2013   Procedure: LAPAROSCOPIC CHOLECYSTECTOMY WITH INTRAOPERATIVE CHOLANGIOGRAM;  Surgeon: Axel Filler, MD;  Location: [facility] OR;  Service: General;  Laterality: N/A;   CORONARY ANGIOPLASTY WITH STENT PLACEMENT  11/27/2008   "2" (01/25/2013)   EUS  12/24/2011   Procedure: FULL UPPER ENDOSCOPIC ULTRASOUND (EUS) RADIAL;  Surgeon: Willis Modena, MD;  Location: [facility] ENDOSCOPY;  Service: Endoscopy;  Laterality: N/A;   INCISION AND DRAINAGE     thrombosed hemorrhoid   LAPAROSCOPIC CHOLECYSTECTOMY  01/25/2013   RIGHT/LEFT HEART CATH AND CORONARY ANGIOGRAPHY N/A 11/13/2021   Procedure: RIGHT/LEFT HEART CATH AND CORONARY ANGIOGRAPHY;  Surgeon: Lyn Records, MD;  Location: [facility] INVASIVE CV LAB;  Service: Cardiovascular;  Laterality: N/A;   Social History   Tobacco Use  Smoking Status Never  Smokeless Tobacco Never    [name] 09/17/2022, 3:44 PM

## 2022-09-25 NOTE — Progress Notes (Signed)
[facility] PRE[facility] Weekly Session  Patient Details  Name: Robert Estes MRN: [id number] Date of Birth: [birth year] Age: 77 y.o. PCP: Loura Back, NP  There were no vitals filed for this visit.   [facility] Weekly seesion - 09/25/22 1500       [facility] "PREP" Location   [facility] "PREP" Location [facility]      Weekly Session   Topic Discussed Goal setting and welcome to the program   scale of percevied exertion   Classes attended to date 1             [name] 09/25/2022, 3:36 PM

## 2022-10-06 ENCOUNTER — Telehealth: Payer: Self-pay

## 2022-10-06 NOTE — Telephone Encounter (Signed)
Called pt. Confirmed he wants to move to a daytime class in Oct for PREP.  Advised will call him in Oct to arrange.

## 2022-10-20 ENCOUNTER — Telehealth: Payer: Self-pay

## 2022-10-20 NOTE — Telephone Encounter (Signed)
LM with wife with next available classes for PREP. Unable to do the 1p or 230p starts or 6pm start. Could possibly do the 1030a start. Wife will give message to pt.

## 2022-12-03 ENCOUNTER — Other Ambulatory Visit: Payer: Self-pay | Admitting: Pharmacist

## 2022-12-03 MED ORDER — REPATHA SURECLICK 140 MG/ML ~~LOC~~ SOAJ: SUBCUTANEOUS | 11 refills | Status: AC

## 2023-03-20 ENCOUNTER — Other Ambulatory Visit: Payer: Self-pay | Admitting: Urology

## 2023-03-20 DIAGNOSIS — R972 Elevated prostate specific antigen [PSA]: Secondary | ICD-10-CM

## 2023-04-21 ENCOUNTER — Encounter (HOSPITAL_COMMUNITY): Payer: Self-pay

## 2023-04-21 ENCOUNTER — Ambulatory Visit (HOSPITAL_COMMUNITY): Admission: EM | Admit: 2023-04-21 | Discharge: 2023-04-21 | Disposition: A | Discharge status: Home / Self Care

## 2023-04-21 DIAGNOSIS — J019 Acute sinusitis, unspecified: Secondary | ICD-10-CM | POA: Diagnosis not present

## 2023-04-21 DIAGNOSIS — R051 Acute cough: Secondary | ICD-10-CM | POA: Diagnosis not present

## 2023-04-21 MED ORDER — BENZONATATE 100 MG PO CAPS: 100.0000 mg | ORAL_CAPSULE | Freq: Three times a day (TID) | ORAL | 0 refills | Status: DC

## 2023-04-21 MED ORDER — AMOXICILLIN-POT CLAVULANATE 875-125 MG PO TABS: 1.0000 | ORAL_TABLET | Freq: Two times a day (BID) | ORAL | 0 refills | Status: DC

## 2023-04-21 NOTE — ED Provider Notes (Signed)
 [facility]    CSN: [id number] Arrival date & time: 04/21/23  1002      History   Chief Complaint Chief Complaint  Patient presents with   Nasal Congestion    HPI Robert Estes is a 78 y.o. male.   Patient presents with 3-week history of nasal congestion, sinus pressure, post nasal drainage, and productive cough with green mucus.   Denies shortness of breath, chest pain, fever, body aches, chills, nausea, vomiting, and diarrhea.   Patient states he has been taking cough syrup and benadryl with some relief.      Past Medical History:  Diagnosis Date   Arthritis    "hands, knees, elbows, fingers" (01/25/2013)   Coronary artery disease    HX OF HEART STENTS X 2   2010--DR. H. SMITH IS PT'S CARDIOLOGIST, MILD AORTIC STENOSIS   Dizziness    Enlarged prostate    FLOMAX HELPS PT VOID   Exertional shortness of breath    GERD (gastroesophageal reflux disease)    OTC PREVACID AS NEEDED   Glaucoma    BOTH EYES   Gout    Hyperlipidemia    Hypertension    Hypertension    Iron deficiency anemia    hx   Migraine    "monthly" (01/25/2013)   Peripheral vascular disease (HCC)    CLAUDICATION    Patient Active Problem List   Diagnosis Date Noted   Stage 3a chronic kidney disease (HCC) 02/16/2020   Neurogenic claudication 02/16/2020   Statin myopathy 09/01/2018   Pituitary tumor 02/19/2018   Deviated septum 08/17/2017   Nasal turbinate hypertrophy 08/17/2017   Pituitary adenoma (HCC) 08/17/2017   Contracture of joint of right hand 04/20/2015   Aortic stenosis 03/10/2014   Left carotid bruit 03/10/2014   Glaucoma    Hypertension    Enlarged prostate    Coronary artery disease    Peripheral vascular disease (HCC)    Dyslipidemia    GERD (gastroesophageal reflux disease)    Gout    Exertional shortness of breath    Iron deficiency anemia    Migraine    Arthritis    S/P laparoscopic cholecystectomy 01/25/2013    Past Surgical History:  Procedure  Laterality Date   CHOLECYSTECTOMY N/A 01/25/2013   Procedure: LAPAROSCOPIC CHOLECYSTECTOMY WITH INTRAOPERATIVE CHOLANGIOGRAM;  Surgeon: Axel Filler, MD;  Location: [facility];  Service: General;  Laterality: N/A;   CORONARY ANGIOPLASTY WITH STENT PLACEMENT  11/27/2008   "2" (01/25/2013)   EUS  12/24/2011   Procedure: FULL UPPER ENDOSCOPIC ULTRASOUND (EUS) RADIAL;  Surgeon: Willis Modena, MD;  Location: [facility];  Service: Endoscopy;  Laterality: N/A;   INCISION AND DRAINAGE     thrombosed hemorrhoid   LAPAROSCOPIC CHOLECYSTECTOMY  01/25/2013   RIGHT/LEFT HEART CATH AND CORONARY ANGIOGRAPHY N/A 11/13/2021   Procedure: RIGHT/LEFT HEART CATH AND CORONARY ANGIOGRAPHY;  Surgeon: Lyn Records, MD;  Location: [facility];  Service: Cardiovascular;  Laterality: N/A;       Home Medications    Prior to Admission medications   Medication Sig Start Date End Date Taking? Authorizing Provider  amoxicillin-clavulanate (AUGMENTIN) 875-125 MG tablet Take 1 tablet by mouth every 12 (twelve) hours. 04/21/23  Yes Susann Givens, Alphus Zeck A, NP  benzonatate (TESSALON) 100 MG capsule Take 1 capsule (100 mg total) by mouth every 8 (eight) hours. 04/21/23  Yes Wynonia Lawman A, NP  brompheniramine-pseudoephedrine-DM 30-2-10 MG/5ML syrup Take 10 mLs by mouth every 4 (four) hours as needed. 04/09/23  Yes  [provider]  pantoprazole (PROTONIX) 40 MG tablet Take 40 mg by mouth daily. 12/22/22  Yes [provider]  acetaminophen (TYLENOL) 500 MG tablet Take 500 mg by mouth every 6 (six) hours as needed for moderate pain.    [provider]  aspirin 81 MG tablet Take 81 mg by mouth daily.    [provider]  clopidogrel (PLAVIX) 75 MG tablet Take 75 mg by mouth daily. 05/14/22 05/14/23  [provider]  eplerenone (INSPRA) 25 MG tablet Take 25 mg by mouth daily. 05/13/22 11/09/22  [provider]  Evolocumab (REPATHA SURECLICK) 140 MG/ML SOAJ INJECT 1 PEN INTO  THE SKIN EVERY 14 DAYS 12/03/22   Maccia, Melissa D, RPH-CPP  latanoprost (XALATAN) 0.005 % ophthalmic solution Place 1 drop into both eyes at bedtime. 11/06/21   [provider]  levocetirizine (XYZAL) 5 MG tablet Take 5 mg by mouth daily. 09/12/21   [provider]  lisinopril (ZESTRIL) 10 MG tablet Take 1 tablet by mouth daily. 05/14/22 05/14/23  [provider]  metoprolol succinate (TOPROL-XL) 50 MG 24 hr tablet Take 1 tablet (50 mg total) by mouth daily. 06/16/22   Meriam Sprague, MD  montelukast (SINGULAIR) 10 MG tablet Take 10 mg by mouth at bedtime. 01/07/22   [provider]  Multiple Vitamin (MULTIVITAMIN) tablet Take 1 tablet by mouth daily. Patient not taking: Reported on 06/03/2022    [provider]  nitroGLYCERIN (NITROSTAT) 0.4 MG SL tablet Place 1 tablet (0.4 mg total) under the tongue every 5 (five) minutes as needed for chest pain. 11/29/21   Lyn Records, MD  polyethylene glycol (MIRALAX / GLYCOLAX) 17 g packet Take 17 g by mouth daily as needed for mild constipation. 04/17/22   [provider]  rosuvastatin (CRESTOR) 40 MG tablet Take 40 mg by mouth daily.    [provider]  sodium chloride (OCEAN) 0.65 % SOLN nasal spray Place 1 spray into both nostrils as needed for congestion.    [provider]  tamsulosin (FLOMAX) 0.4 MG CAPS capsule Take 0.4 mg by mouth daily.    [provider]  testosterone cypionate (DEPOTESTOSTERONE CYPIONATE) 200 MG/ML injection Inject 200 mg into the muscle every 14 (fourteen) days. 09/12/21   [provider]  TURMERIC PO Take 2 capsules by mouth daily.    [provider]  zolpidem (AMBIEN) 10 MG tablet TAKE 1 TABLET(10 MG) BY MOUTH AT BEDTIME AS NEEDED FOR SLEEP 07/21/20   Georgina Quint, MD    Family History Family History  Problem Relation Age of Onset   Breast cancer Mother    Cancer Mother    Heart failure Mother    Heart disease  Father    Heart attack Father     Social History Social History   Tobacco Use   Smoking status: Never   Smokeless tobacco: Never  Vaping Use   Vaping status: Never Used  Substance Use Topics   Alcohol use: Yes    Comment: Drinks a beer or glass of wine 1-2 times per month    Drug use: No     Allergies   Codeine   Review of Systems Review of Systems  Per HPI  Physical Exam Triage Vital Signs ED Triage Vitals  Encounter Vitals Group     BP 04/21/23 1156 116/74     Systolic BP Percentile --      Diastolic BP Percentile --      Pulse Rate 04/21/23  1156 72     Resp 04/21/23 1156 16     Temp 04/21/23 1156 97.7 F (36.5 C)     Temp Source 04/21/23 1156 Oral     SpO2 04/21/23 1156 96 %     Weight 04/21/23 1156 160 lb (72.6 kg)     Height 04/21/23 1156 5\' 6"  (1.676 m)     Head Circumference --      Peak Flow --      Pain Score 04/21/23 1155 7     Pain Loc --      Pain Education --      Exclude from Growth Chart --    No data found.  Updated Vital Signs BP 116/74 (BP Location: Left Arm)   Pulse 72   Temp 97.7 F (36.5 C) (Oral)   Resp 16   Ht 5\' 6"  (1.676 m)   Wt 160 lb (72.6 kg)   SpO2 96%   BMI 25.82 kg/m   Visual Acuity Right Eye Distance:   Left Eye Distance:   Bilateral Distance:    Right Eye Near:   Left Eye Near:    Bilateral Near:     Physical Exam Vitals and nursing note reviewed.  Constitutional:      General: He is awake. He is not in acute distress.    Appearance: Normal appearance. He is well-developed and well-groomed. He is not ill-appearing.  HENT:     Right Ear: Tympanic membrane, ear canal and external ear normal.     Left Ear: Tympanic membrane, ear canal and external ear normal.     Nose: Congestion and rhinorrhea present.     Mouth/Throat:     Mouth: Mucous membranes are moist.     Pharynx: Posterior oropharyngeal erythema present. No oropharyngeal exudate.  Cardiovascular:     Rate and Rhythm: Normal rate and regular  rhythm.  Pulmonary:     Effort: Pulmonary effort is normal.     Breath sounds: Normal breath sounds.  Skin:    General: Skin is warm and dry.  Neurological:     Mental Status: He is alert.  Psychiatric:        Behavior: Behavior is cooperative.      UC Treatments / Results  Labs (all labs ordered are listed, but only abnormal results are displayed) Labs Reviewed - No data to display  EKG   Radiology No results found.  Procedures Procedures (including critical care time)  Medications Ordered in UC Medications - No data to display  Initial Impression / Assessment and Plan / UC Course  I have reviewed the triage vital signs and the nursing notes.  Pertinent labs & imaging results that were available during my care of the patient were reviewed by me and considered in my medical decision making (see chart for details).     Upon assessment congestion and rhinorrhea are present, mild erythema noted to pharynx. Lungs clear bilaterally on auscultation. No other significant findings upon exam.   Prescribed Augmentin for sinusitis. Prescribed Tessalon as needed for cough. Discussed OTC medication for symptoms. Discussed follow-up and return precautions.   Final Clinical Impressions(s) / UC Diagnoses   Final diagnoses:  Acute non-recurrent sinusitis, unspecified location  Acute cough     Discharge Instructions      Start taking Augmentin twice daily for 7 days for sinus infection.  You can continue taking cough syrup as needed for cough.  I have also prescribed Tessalon that you can take every 8 hours as  needed for cough.  I also recommend taking Mucinex to help with cough and congestion.  You can also try using saline nasal sprays as needed for congestion.  Return here or follow-up with primary care provider if your symptoms persist or worsen.    ED Prescriptions     Medication Sig Dispense Auth. Provider   amoxicillin-clavulanate (AUGMENTIN) 875-125 MG tablet  Take 1 tablet by mouth every 12 (twelve) hours. 14 tablet Susann Givens, Doy Taaffe A, NP   benzonatate (TESSALON) 100 MG capsule Take 1 capsule (100 mg total) by mouth every 8 (eight) hours. 21 capsule Wynonia Lawman A, NP      PDMP not reviewed this encounter.   Wynonia Lawman A, NP 04/21/23 1315

## 2023-04-21 NOTE — ED Triage Notes (Signed)
 Patient here today with c/o nasal congestion, PND, and productive cough X 3 weeks. Patient states that at night while he is lying down it's hard to breath. Patient has been using a cough syrup with some relief. Denies fever. He has also been taking benadryl with some relief.

## 2023-04-21 NOTE — Discharge Instructions (Signed)
 Start taking Augmentin twice daily for 7 days for sinus infection.  You can continue taking cough syrup as needed for cough.  I have also prescribed Tessalon that you can take every 8 hours as needed for cough.  I also recommend taking Mucinex to help with cough and congestion.  You can also try using saline nasal sprays as needed for congestion.  Return here or follow-up with primary care provider if your symptoms persist or worsen.

## 2023-04-24 ENCOUNTER — Telehealth (HOSPITAL_COMMUNITY): Payer: Self-pay

## 2023-04-24 NOTE — Telephone Encounter (Signed)
 Attempted to reach patient x1. LVM on both numbers listed in chart.

## 2023-04-24 NOTE — Telephone Encounter (Signed)
 I recommend patient return here for re-evaluation if symptoms are persisting despite taking antibiotic for a few days.

## 2023-04-24 NOTE — Telephone Encounter (Signed)
 Pt's wife returned call. Advised of NP recs. Also advised of symptomatic care for diarrhea. Wife expressed appreciation and verbalized understanding.

## 2023-04-24 NOTE — Telephone Encounter (Signed)
 Received the following message from patient access: "the medicine prescribed gave pt diarrhea and symptoms have not gotten better and wants different medicine prescribed."  Please advise. Thank you

## 2023-05-05 ENCOUNTER — Other Ambulatory Visit: Payer: Self-pay | Admitting: Urology

## 2023-05-05 DIAGNOSIS — R972 Elevated prostate specific antigen [PSA]: Secondary | ICD-10-CM

## 2023-06-10 ENCOUNTER — Emergency Department (HOSPITAL_COMMUNITY)
Admission: EM | Admit: 2023-06-10 | Discharge: 2023-06-10 | Disposition: A | Discharge status: Home / Self Care | Attending: Emergency Medicine | Admitting: Emergency Medicine

## 2023-06-10 ENCOUNTER — Encounter (HOSPITAL_COMMUNITY): Payer: Self-pay

## 2023-06-10 ENCOUNTER — Emergency Department (HOSPITAL_COMMUNITY)

## 2023-06-10 ENCOUNTER — Other Ambulatory Visit: Payer: Self-pay

## 2023-06-10 DIAGNOSIS — R443 Hallucinations, unspecified: Secondary | ICD-10-CM | POA: Diagnosis present

## 2023-06-10 DIAGNOSIS — Z79899 Other long term (current) drug therapy: Secondary | ICD-10-CM | POA: Diagnosis not present

## 2023-06-10 DIAGNOSIS — I10 Essential (primary) hypertension: Secondary | ICD-10-CM | POA: Insufficient documentation

## 2023-06-10 DIAGNOSIS — I2510 Atherosclerotic heart disease of native coronary artery without angina pectoris: Secondary | ICD-10-CM | POA: Diagnosis not present

## 2023-06-10 LAB — CBC
HCT: 55.4 % — ABNORMAL HIGH (ref 39.0–52.0)
Hemoglobin: 17.7 g/dL — ABNORMAL HIGH (ref 13.0–17.0)
MCH: 27.9 pg (ref 26.0–34.0)
MCHC: 31.9 g/dL (ref 30.0–36.0)
MCV: 87.4 fL (ref 80.0–100.0)
Platelets: 143 10*3/uL — ABNORMAL LOW (ref 150–400)
RBC: 6.34 MIL/uL — ABNORMAL HIGH (ref 4.22–5.81)
RDW: 17.2 % — ABNORMAL HIGH (ref 11.5–15.5)
WBC: 4.8 10*3/uL (ref 4.0–10.5)
nRBC: 0 % (ref 0.0–0.2)

## 2023-06-10 LAB — RAPID URINE DRUG SCREEN, HOSP PERFORMED
Amphetamines: NOT DETECTED
Barbiturates: NOT DETECTED
Benzodiazepines: NOT DETECTED
Cocaine: NOT DETECTED
Opiates: NOT DETECTED
Tetrahydrocannabinol: NOT DETECTED

## 2023-06-10 LAB — COMPREHENSIVE METABOLIC PANEL WITH GFR
ALT: 25 U/L (ref 0–44)
AST: 20 U/L (ref 15–41)
Albumin: 4.2 g/dL (ref 3.5–5.0)
Alkaline Phosphatase: 65 U/L (ref 38–126)
Anion gap: 9 (ref 5–15)
BUN: 30 mg/dL — ABNORMAL HIGH (ref 8–23)
CO2: 23 mmol/L (ref 22–32)
Calcium: 9.3 mg/dL (ref 8.9–10.3)
Chloride: 103 mmol/L (ref 98–111)
Creatinine, Ser: 1.82 mg/dL — ABNORMAL HIGH (ref 0.61–1.24)
GFR, Estimated: 38 mL/min — ABNORMAL LOW (ref >60)
Glucose, Bld: 97 mg/dL (ref 70–99)
Potassium: 4.5 mmol/L (ref 3.5–5.1)
Sodium: 135 mmol/L (ref 135–145)
Total Bilirubin: 0.9 mg/dL (ref 0.0–1.2)
Total Protein: 7.1 g/dL (ref 6.5–8.1)

## 2023-06-10 LAB — ETHANOL: Alcohol, Ethyl (B): <15 mg/dL (ref <15)

## 2023-06-10 NOTE — ED Provider Notes (Signed)
  Physical Exam  BP 128/70   Pulse 62   Temp 97.7 F (36.5 C) (Oral)   Resp 16   Ht 5\' 4"  (1.626 m)   Wt 76.7 kg   SpO2 100%   BMI 29.01 kg/m   Physical Exam Vitals and nursing note reviewed.  Constitutional:      General: He is not in acute distress.    Appearance: Normal appearance.  HENT:     Head: Normocephalic and atraumatic.  Eyes:     General:        Right eye: No discharge.        Left eye: No discharge.  Cardiovascular:     Comments: Regular rate and rhythm.  S1/S2 are distinct without any evidence of murmur, rubs, or gallops.  Radial pulses are 2+ bilaterally.  Dorsalis pedis pulses are 2+ bilaterally.  No evidence of pedal edema. Pulmonary:     Comments: Clear to auscultation bilaterally.  Normal effort.  No respiratory distress.  No evidence of wheezes, rales, or rhonchi heard throughout. Abdominal:     General: Abdomen is flat. Bowel sounds are normal. There is no distension.     Tenderness: There is no abdominal tenderness. There is no guarding or rebound.  Musculoskeletal:        General: Normal range of motion.     Cervical back: Neck supple.  Skin:    General: Skin is warm and dry.     Findings: No rash.  Neurological:     General: No focal deficit present.     Mental Status: He is alert.     Comments: Cranial nerves II through XII are intact.  5/5 strength to the upper and lower extremities.  Normal sensation to the upper and lower extremities.  No dysmetria with finger-nose.  Speech is normal.  Psychiatric:        Mood and Affect: Mood normal.        Behavior: Behavior normal.     Procedures  Procedures  ED Course / MDM    Medical Decision Making Amount and/or Complexity of Data Reviewed Labs: ordered. Radiology: ordered.   Accepted handoff at shift change from [name]  PA-C. Please see prior provider note for more detail.   Briefly: Patient is 77 y.o. who is present to the emergency department today with hallucinations.  Patient was  recently restarted on zolpidem  which she took the night his hallucinations started.  Patient woke up feeling overall well.  There was no focal weakness or numbness.  Per the son he is currently at his baseline.  DDX: concern for possible stroke or TIA.  Plan: Discharge home and follow-up with primary care physician.          Robert Kennel Muse, PA-C 06/10/23 Robert Estes    Robert Stalling, MD 06/22/23 1537

## 2023-06-10 NOTE — Discharge Instructions (Addendum)
 Please follow-up with your primary care doctor.  I would like for you to return to the emerged apartment for any worsening symptoms.  If you start experiencing similar symptoms or you are having weakness or numbness on one side of your body would like for you return to the emergency department immediately for further evaluation.

## 2023-06-10 NOTE — ED Triage Notes (Addendum)
 Patient said he was hallucinating last night. Walked into his sons room. Sees shadows that are not there in real life. Denies alcohol or drug use. Denies hearing voices. Felt like his right pointer and thumb finger were tingling since Monday off and on. Stopped taking Zolpidem  for 2 days then had become altered per son.

## 2023-06-10 NOTE — ED Provider Notes (Cosign Needed Addendum)
 [place] [facility]ERGENCY DEPARTMENT AT [facility] Provider Note   CSN: [id number] Arrival date & time: 06/10/23  1058     History  Chief Complaint  Patient presents with   Hallucinations    Robert Estes is a 78 y.o. male who presents to the emergency department with a chief complaint of hallucinations.  Son at bedside gives history, stating that last night the patient went to bed after taking his Ambien  and at approximately 2 AM was walking around the house confused looking for his bed.  Son states that the patient eventually ended up in his bedroom and that he helped the patient back to his room and called EMS.  States that EMS came and assessed the patient at that time, but that there was no reason for transport.  Patient contacted PCP today who recommended the patient come in for TIA/stroke workup.  Patient has a past medical history significant for hypertension, coronary artery disease with stent placement, peripheral vascular disease, hyperlipidemia, GERD, aortic stenosis. Patient denies psychiatric history. Patient states that the last couple of weeks he has been dealing with headaches which is abnormal for him, saw [facility] and was prescribed tramadol  which he has seen improvement with. Son also states that patient was out of Ambien  Saturday through Monday, the night that this incident happened was the first time he had taken it after getting it refilled.   HPI     Home Medications Prior to Admission medications   Medication Sig Start Date End Date Taking? Authorizing Provider  acetaminophen  (TYLENOL ) 500 MG tablet Take 500 mg by mouth every 6 (six) hours as needed for moderate pain.    [provider]  amoxicillin -clavulanate (AUGMENTIN ) 875-125 MG tablet Take 1 tablet by mouth every 12 (twelve) hours. 04/21/23   Levora Reas A, NP  aspirin  81 MG tablet Take 81 mg by mouth daily.    [provider]  benzonatate  (TESSALON ) 100 MG capsule Take 1  capsule (100 mg total) by mouth every 8 (eight) hours. 04/21/23   Levora Reas A, NP  brompheniramine-pseudoephedrine-DM 30-2-10 MG/5ML syrup Take 10 mLs by mouth every 4 (four) hours as needed. 04/09/23   [provider]  eplerenone (INSPRA) 25 MG tablet Take 25 mg by mouth daily. 05/13/22 11/09/22  [provider]  Evolocumab  (REPATHA  SURECLICK) 140 MG/ML SOAJ INJECT 1 PEN INTO THE SKIN EVERY 14 DAYS 12/03/22   Maccia, Melissa D, RPH-CPP  latanoprost (XALATAN) 0.005 % ophthalmic solution Place 1 drop into both eyes at bedtime. 11/06/21   [provider]  levocetirizine (XYZAL) 5 MG tablet Take 5 mg by mouth daily. 09/12/21   [provider]  metoprolol  succinate (TOPROL -XL) 50 MG 24 hr tablet Take 1 tablet (50 mg total) by mouth daily. 06/16/22   Sonny Dust, MD  montelukast (SINGULAIR) 10 MG tablet Take 10 mg by mouth at bedtime. 01/07/22   [provider]  Multiple Vitamin (MULTIVITAMIN) tablet Take 1 tablet by mouth daily. Patient not taking: Reported on 06/03/2022    [provider]  nitroGLYCERIN  (NITROSTAT ) 0.4 MG SL tablet Place 1 tablet (0.4 mg total) under the tongue every 5 (five) minutes as needed for chest pain. 11/29/21   Arty Binning, MD  pantoprazole  (PROTONIX ) 40 MG tablet Take 40 mg by mouth daily. 12/22/22   [provider]  polyethylene glycol (MIRALAX / GLYCOLAX) 17 g packet Take 17 g by mouth daily as needed for mild constipation. 04/17/22   [provider]  rosuvastatin  (CRESTOR ) 40 MG tablet Take 40 mg by mouth daily.    [provider]  sodium chloride  (OCEAN) 0.65 % SOLN nasal spray Place 1 spray into both nostrils as needed for congestion.    [provider]  tamsulosin  (FLOMAX ) 0.4 MG CAPS capsule Take 0.4 mg by mouth daily.    [provider]  testosterone  cypionate (DEPOTESTOSTERONE CYPIONATE) 200 MG/ML injection Inject 200 mg into the muscle every 14 (fourteen) days.  09/12/21   [provider]  TURMERIC PO Take 2 capsules by mouth daily.    [provider]  zolpidem  (AMBIEN ) 10 MG tablet TAKE 1 TABLET(10 MG) BY MOUTH AT BEDTIME AS NEEDED FOR SLEEP 07/21/20   Elvira Hammersmith, MD      Allergies    Codeine    Review of Systems   Review of Systems  Constitutional:  Negative for activity change, appetite change, chills, fatigue and fever.  Eyes:  Positive for visual disturbance.  Respiratory:  Negative for chest tightness and shortness of breath.   Cardiovascular:  Negative for chest pain and leg swelling.  Gastrointestinal:  Negative for abdominal pain, diarrhea, nausea and vomiting.  Musculoskeletal:  Positive for back pain and myalgias.       Patient states that at time he has a pain radiating down to his R leg from his back  Skin:  Negative for rash and wound.  Neurological:  Positive for numbness (R hand thumb and index finger, numbness comes and goes) and headaches. Negative for dizziness, tremors, seizures, syncope, facial asymmetry, speech difficulty and weakness.  Psychiatric/Behavioral:  Positive for hallucinations and sleep disturbance. Negative for behavioral problems and confusion.     Physical Exam Updated Vital Signs BP 130/73 (BP Location: Right Arm)   Pulse 66   Temp 98.5 F (36.9 C) (Oral)   Resp 16   Ht 5\' 4"  (1.626 m)   Wt 76.7 kg   SpO2 100%   BMI 29.01 kg/m  Physical Exam Vitals and nursing note reviewed.  Constitutional:      General: He is awake. He is not in acute distress.    Appearance: Normal appearance. He is not ill-appearing, toxic-appearing or diaphoretic.  HENT:     Head: Normocephalic and atraumatic.  Eyes:     Extraocular Movements: Extraocular movements intact.     Pupils: Pupils are equal, round, and reactive to light.  Cardiovascular:     Rate and Rhythm: Normal rate and regular rhythm.  Pulmonary:     Effort: Pulmonary effort is normal. No respiratory distress.     Breath  sounds: No wheezing, rhonchi or rales.  Musculoskeletal:        General: No signs of injury. Normal range of motion.     Cervical back: Normal range of motion. No tenderness.     Right lower leg: No edema.     Left lower leg: No edema.     Comments: Back/RLE: Patient states that he feels like he strained a muscle last Friday at work and has had some lingering back pain since. Positive R straight leg raise test, with patient stating he feels pain radiating from his back down his leg, no motor weakness, ROM, sensation of RLE intact.   R hand: patient states he has had numbness to his R thumb and index finger that comes and goes, believes this has been ongoing for some time. No weakness associated, only numbness. Tinel and Phalen test negative.   Skin:  General: Skin is warm and dry.     Capillary Refill: Capillary refill takes less than 2 seconds.  Neurological:     General: No focal deficit present.     Mental Status: He is alert and oriented to person, place, and time.     GCS: GCS eye subscore is 4. GCS verbal subscore is 5. GCS motor subscore is 6.     Cranial Nerves: No cranial nerve deficit or facial asymmetry.     Sensory: No sensory deficit.     Motor: No weakness or pronator drift.     Coordination: Coordination is intact. Coordination normal.     Gait: Gait normal.  Psychiatric:        Attention and Perception: Attention and perception normal. He does not perceive auditory or visual hallucinations.        Mood and Affect: Mood and affect normal.        Speech: Speech normal.        Behavior: Behavior normal. Behavior is cooperative.        Cognition and Memory: Cognition and memory normal.        Judgment: Judgment normal.     ED Results / Procedures / Treatments   Labs (all labs ordered are listed, but only abnormal results are displayed) Labs Reviewed  COMPREHENSIVE METABOLIC PANEL WITH GFR - Abnormal; Notable for the following components:      Result Value   BUN 30  (*)    Creatinine, Ser 1.82 (*)    GFR, Estimated 38 (*)    All other components within normal limits  CBC - Abnormal; Notable for the following components:   RBC 6.34 (*)    Hemoglobin 17.7 (*)    HCT 55.4 (*)    RDW 17.2 (*)    Platelets 143 (*)    All other components within normal limits  RAPID URINE DRUG SCREEN, HOSP PERFORMED  ETHANOL    EKG None  Radiology No results found.  Procedures Procedures    Medications Ordered in ED Medications - No data to display  ED Course/ Medical Decision Making/ A&P    Patient presents to the ED for concern of hallucinations, this involves an extensive number of treatment options, and is a complaint that carries with it a high risk of complications and morbidity.  The differential diagnosis includes psychiatric diagnosis, TIA, stroke, medication side effect, brain bleed, etc.    Co morbidities that complicate the patient evaluation  Back pain radiating down R leg, numbness of thumb and index finger of R hand    Additional history obtained:  Additional history obtained from Family    Lab Tests:  I Ordered, and personally interpreted labs.  The pertinent results include: CBC -WBC 4.8, RBC 6.34, hemoglobin 17.7, HCT 55.4, RDW 17.2, platelets 143 UDS negative CMP - BUN 30, creatinine 1.2, estimated GFR 38   Imaging Studies ordered:  I ordered imaging studies including CT head without contrast - PENDING I independently visualized and interpreted imaging which showed - PENDING I agree with the radiologist interpretation   Test Considered:  MRI brain - could be considered based off of further work-up   Critical Interventions:  None   Problem List / ED Course:  Hallucinations, back pain, right lower extremity pain, right hand thumb and index finger numbness Patient presents to emergency department for stroke/TIA workup after consulting PCP after last night's events.  Patient was hallucinating last night, seeing  things in the shadows as well as walking  around in his sleep, eventually ended up in son's room who took him back to his bedroom and called EMS.  EMS workup unremarkable. No history of psychiatric diagnosis, no history of stroke Patient was out of Ambien  Saturday-Monday, the night of the incident was his first time taking it after getting it refilled, denies use of tramadol  and ambien  together.  History of recent headache for last few weeks, different from baseline Based on history, physical exam, labs CT head without contrast ordered - PENDING AT THIS TIME Patient neurologic exam unremarkable in the supine position, positive R straight leg raise test --> suspect sciatica for back pain radiating down R leg, numbness and tingling of R index and thumb --> possibly carpal tunnel not numb at time of exam, no weakness today on exam, no pronator drift, no facial droop, no signs of acute stroke, no obvious deficit Patient denies current hallucinations, auditory hallucinations, HI, SI  Patient signed out to [facility] for ongoing diagnosis and treatment, pending CT scan and further workup   Social Determinants of Health:  none   Dispostion: Patient signed out to [place] [employer] for ongoing diagnosis and treatment, pending CT scan and further workup  Click here for ABCD2, HEART and other calculatorsREFRESH Note before signing :1}                              Medical Decision Making Amount and/or Complexity of Data Reviewed Labs: ordered. Radiology: ordered.          Final Clinical Impression(s) / ED Diagnoses Final diagnoses:  None    Rx / DC Orders ED Discharge Orders     None         Fonda Hymen, PA-C 06/10/23 1609    Fonda Hymen, PA-C 06/10/23 1611    Teddi Favors, DO 06/11/23 0820

## 2023-06-14 ENCOUNTER — Other Ambulatory Visit: Payer: Self-pay

## 2023-06-14 ENCOUNTER — Encounter (HOSPITAL_COMMUNITY): Payer: Self-pay

## 2023-06-14 ENCOUNTER — Emergency Department (HOSPITAL_COMMUNITY)
Admission: EM | Admit: 2023-06-14 | Discharge: 2023-06-14 | Disposition: A | Discharge status: Home / Self Care | Attending: Emergency Medicine | Admitting: Emergency Medicine

## 2023-06-14 DIAGNOSIS — M5431 Sciatica, right side: Secondary | ICD-10-CM | POA: Diagnosis not present

## 2023-06-14 DIAGNOSIS — M549 Dorsalgia, unspecified: Secondary | ICD-10-CM | POA: Diagnosis present

## 2023-06-14 MED ORDER — HYDROCODONE-ACETAMINOPHEN 5-325 MG PO TABS: 1.0000 | ORAL_TABLET | Freq: Four times a day (QID) | ORAL | 0 refills | Status: AC | PRN

## 2023-06-14 MED ORDER — PREDNISONE 20 MG PO TABS: 40.0000 mg | ORAL_TABLET | Freq: Every day | ORAL | 0 refills | Status: DC

## 2023-06-14 MED ORDER — DEXAMETHASONE SODIUM PHOSPHATE 10 MG/ML IJ SOLN
10.0000 mg | Freq: Once | INTRAMUSCULAR | Status: AC
  Administered 2023-06-14: 10 mg via INTRAMUSCULAR
  Filled 2023-06-14: qty 1

## 2023-06-14 MED ORDER — DICLOFENAC EPOLAMINE 1.3 % EX PTCH: 1.0000 | MEDICATED_PATCH | Freq: Two times a day (BID) | CUTANEOUS | 1 refills | Status: AC

## 2023-06-14 MED ORDER — DICLOFENAC EPOLAMINE 1.3 % EX PTCH
1.0000 | MEDICATED_PATCH | Freq: Two times a day (BID) | CUTANEOUS | Status: DC
  Administered 2023-06-14: 1 via TRANSDERMAL
  Filled 2023-06-14: qty 1

## 2023-06-14 MED ORDER — HYDROCODONE-ACETAMINOPHEN 5-325 MG PO TABS
1.0000 | ORAL_TABLET | Freq: Once | ORAL | Status: AC
  Administered 2023-06-14: 1 via ORAL
  Filled 2023-06-14: qty 1

## 2023-06-14 NOTE — ED Triage Notes (Signed)
 Pt reports back pain on lower right side that radiates down leg and in hip. Pt reports on the 9th he stumbled and fell against a tire at work. Pt reports the pain feels like someone is cutting his leg open. Pt denies pain anywhere else. Ambulatory to triage.

## 2023-06-14 NOTE — ED Provider Notes (Signed)
 Robert Estes   CSN: [id number] Arrival date & time: 06/14/23  1910     History  Chief Complaint  Patient presents with   Back Pain   Leg Pain    Robert Estes is a 78 y.o. male.  HPI Presents 1 week after evaluation at our affiliated facility, now with concern for back pain, no concern for hallucinations. Patient is here with his son who assists with the history.  Patient is continue to work, Lobbyist for himself.  Over the past week he developed severe pain in his right buttock rating down his right leg.  No abdominal pain, no incontinence, no fever, no chest pain, no dyspnea.  Minimal relief with Tylenol .    Home Medications Prior to Admission medications   Medication Sig Start Date End Date Taking? Authorizing Provider  diclofenac (FLECTOR) 1.3 % PTCH Place 1 patch onto the skin 2 (two) times daily. 06/14/23  Yes Dorenda Gandy, MD  HYDROcodone -acetaminophen  (NORCO/VICODIN) 5-325 MG tablet Take 1 tablet by mouth every 6 (six) hours as needed for severe pain (pain score 7-10). 06/14/23  Yes Dorenda Gandy, MD  predniSONE  (DELTASONE ) 20 MG tablet Take 2 tablets (40 mg total) by mouth daily with breakfast. For the next four days 06/14/23  Yes Dorenda Gandy, MD  acetaminophen  (TYLENOL ) 500 MG tablet Take 500 mg by mouth every 6 (six) hours as needed for moderate pain.    [provider]  amoxicillin -clavulanate (AUGMENTIN ) 875-125 MG tablet Take 1 tablet by mouth every 12 (twelve) hours. 04/21/23   Levora Reas A, NP  aspirin  81 MG tablet Take 81 mg by mouth daily.    [provider]  benzonatate  (TESSALON ) 100 MG capsule Take 1 capsule (100 mg total) by mouth every 8 (eight) hours. 04/21/23   Levora Reas A, NP  brompheniramine-pseudoephedrine-DM 30-2-10 MG/5ML syrup Take 10 mLs by mouth every 4 (four) hours as needed. 04/09/23   [provider]  eplerenone (INSPRA) 25 MG tablet  Take 25 mg by mouth daily. 05/13/22 11/09/22  [provider]  Evolocumab  (REPATHA  SURECLICK) 140 MG/ML SOAJ INJECT 1 PEN INTO THE SKIN EVERY 14 DAYS 12/03/22   Maccia, Melissa D, RPH-CPP  latanoprost (XALATAN) 0.005 % ophthalmic solution Place 1 drop into both eyes at bedtime. 11/06/21   [provider]  levocetirizine (XYZAL) 5 MG tablet Take 5 mg by mouth daily. 09/12/21   [provider]  metoprolol  succinate (TOPROL -XL) 50 MG 24 hr tablet Take 1 tablet (50 mg total) by mouth daily. 06/16/22   Sonny Dust, MD  montelukast (SINGULAIR) 10 MG tablet Take 10 mg by mouth at bedtime. 01/07/22   [provider]  Multiple Vitamin (MULTIVITAMIN) tablet Take 1 tablet by mouth daily. Patient not taking: Reported on 06/03/2022    [provider]  nitroGLYCERIN  (NITROSTAT ) 0.4 MG SL tablet Place 1 tablet (0.4 mg total) under the tongue every 5 (five) minutes as needed for chest pain. 11/29/21   Arty Binning, MD  pantoprazole  (PROTONIX ) 40 MG tablet Take 40 mg by mouth daily. 12/22/22   [provider]  polyethylene glycol (MIRALAX / GLYCOLAX) 17 g packet Take 17 g by mouth daily as needed for mild constipation. 04/17/22   [provider]  rosuvastatin  (CRESTOR ) 40 MG tablet Take 40 mg by mouth daily.    [provider]  sodium chloride  (OCEAN) 0.65 % SOLN nasal spray Place 1 spray into both nostrils as needed  for congestion.    [provider]  tamsulosin  (FLOMAX ) 0.4 MG CAPS capsule Take 0.4 mg by mouth daily.    [provider]  testosterone  cypionate (DEPOTESTOSTERONE CYPIONATE) 200 MG/ML injection Inject 200 mg into the muscle every 14 (fourteen) days. 09/12/21   [provider]  TURMERIC PO Take 2 capsules by mouth daily.    [provider]  zolpidem  (AMBIEN ) 10 MG tablet TAKE 1 TABLET(10 MG) BY MOUTH AT BEDTIME AS NEEDED FOR SLEEP 07/21/20   Elvira Hammersmith, MD      Allergies    Codeine     Review of Systems   Review of Systems  Physical Exam Updated Vital Signs BP 118/73 (BP Location: Right Arm)   Pulse (!) 58   Temp 97.9 F (36.6 C) (Oral)   Resp 19   SpO2 100%  Physical Exam Vitals and nursing Estes reviewed.  Constitutional:      General: He is not in acute distress.    Appearance: He is well-developed.  HENT:     Head: Normocephalic and atraumatic.  Eyes:     Conjunctiva/sclera: Conjunctivae normal.  Cardiovascular:     Rate and Rhythm: Normal rate and regular rhythm.     Pulses: Normal pulses.  Pulmonary:     Effort: Pulmonary effort is normal. No respiratory distress.     Breath sounds: No stridor.  Abdominal:     General: There is no distension.  Musculoskeletal:     Comments: Unremarkable musculoskeletal exam, patient flexes each hip, both knees against resistance and randomly.  Back exam unremarkable.  No skin changes.  Skin:    General: Skin is warm and dry.  Neurological:     Mental Status: He is alert and oriented to person, place, and time.     Motor: No weakness, tremor or abnormal muscle tone.     ED Results / Procedures / Treatments   Labs (all labs ordered are listed, but only abnormal results are displayed) Labs Reviewed - No data to display  EKG None  Radiology No results found.  Procedures Procedures    Medications Ordered in ED Medications  dexamethasone (DECADRON) injection 10 mg (has no administration in time range)  diclofenac (FLECTOR) 1.3 % 1 patch (has no administration in time range)  HYDROcodone -acetaminophen  (NORCO/VICODIN) 5-325 MG per tablet 1 tablet (has no administration in time range)    ED Course/ Medical Decision Making/ A&P                                 Medical Decision Making Adult male presents with signs and symptoms of sciatica.  No red flags suggesting CNS dysfunction.  He is awake, alert, hemodynamically unremarkable, no evidence for infection.  Patient started appropriate meds here,  discharged with prescriptions to follow-up with his orthopedist for ongoing outpatient therapy.  Amount and/or Complexity of Data Reviewed Independent Historian:     Details: Send External Data Reviewed: notes.    Details: Notes hospitalization  Risk Prescription drug management.  Final Clinical Impression(s) / ED Diagnoses Final diagnoses:  Sciatica of right side    Rx / DC Orders ED Discharge Orders          Ordered    HYDROcodone -acetaminophen  (NORCO/VICODIN) 5-325 MG tablet  Every 6 hours PRN        06/14/23 2124    diclofenac (FLECTOR) 1.3 % PTCH  2 times daily  06/14/23 2124    predniSONE  (DELTASONE ) 20 MG tablet  Daily with breakfast        06/14/23 2124              Dorenda Gandy, MD 06/14/23 2125

## 2023-06-14 NOTE — Discharge Instructions (Addendum)
 Please be sure to follow-up with your physician tomorrow via telephone.  Take all medication as prescribed and return here for concerning changes in your condition.

## 2023-06-18 ENCOUNTER — Other Ambulatory Visit: Payer: Self-pay | Admitting: Orthopedic Surgery

## 2023-06-18 DIAGNOSIS — M5459 Other low back pain: Secondary | ICD-10-CM

## 2023-06-21 ENCOUNTER — Ambulatory Visit
Admission: RE | Admit: 2023-06-21 | Discharge: 2023-06-21 | Disposition: A | Discharge status: Home / Self Care | Source: Ambulatory Visit | Attending: Orthopedic Surgery | Admitting: Orthopedic Surgery

## 2023-06-21 DIAGNOSIS — M5459 Other low back pain: Secondary | ICD-10-CM

## 2023-08-02 ENCOUNTER — Other Ambulatory Visit

## 2023-08-03 ENCOUNTER — Encounter (HOSPITAL_COMMUNITY): Payer: Self-pay

## 2023-08-03 ENCOUNTER — Emergency Department (HOSPITAL_COMMUNITY)

## 2023-08-03 ENCOUNTER — Ambulatory Visit (HOSPITAL_COMMUNITY): Admission: EM | Admit: 2023-08-03 | Discharge: 2023-08-03 | Disposition: A | Discharge status: Home / Self Care

## 2023-08-03 ENCOUNTER — Emergency Department (HOSPITAL_COMMUNITY): Admission: EM | Admit: 2023-08-03 | Discharge: 2023-08-03 | Disposition: A | Discharge status: Home / Self Care

## 2023-08-03 ENCOUNTER — Encounter (HOSPITAL_COMMUNITY): Payer: Self-pay | Admitting: Emergency Medicine

## 2023-08-03 ENCOUNTER — Other Ambulatory Visit: Payer: Self-pay

## 2023-08-03 DIAGNOSIS — R42 Dizziness and giddiness: Secondary | ICD-10-CM | POA: Diagnosis present

## 2023-08-03 DIAGNOSIS — R55 Syncope and collapse: Secondary | ICD-10-CM | POA: Insufficient documentation

## 2023-08-03 DIAGNOSIS — Z7982 Long term (current) use of aspirin: Secondary | ICD-10-CM | POA: Diagnosis not present

## 2023-08-03 LAB — CBC
HCT: 53 % — ABNORMAL HIGH (ref 39.0–52.0)
Hemoglobin: 17.3 g/dL — ABNORMAL HIGH (ref 13.0–17.0)
MCH: 28.9 pg (ref 26.0–34.0)
MCHC: 32.6 g/dL (ref 30.0–36.0)
MCV: 88.6 fL (ref 80.0–100.0)
Platelets: 145 K/uL — ABNORMAL LOW (ref 150–400)
RBC: 5.98 MIL/uL — ABNORMAL HIGH (ref 4.22–5.81)
RDW: 17.7 % — ABNORMAL HIGH (ref 11.5–15.5)
WBC: 5.2 K/uL (ref 4.0–10.5)
nRBC: 0 % (ref 0.0–0.2)

## 2023-08-03 LAB — BASIC METABOLIC PANEL WITH GFR
Anion gap: 10 (ref 5–15)
BUN: 22 mg/dL (ref 8–23)
CO2: 20 mmol/L — ABNORMAL LOW (ref 22–32)
Calcium: 8.9 mg/dL (ref 8.9–10.3)
Chloride: 107 mmol/L (ref 98–111)
Creatinine, Ser: 1.68 mg/dL — ABNORMAL HIGH (ref 0.61–1.24)
GFR, Estimated: 42 mL/min — ABNORMAL LOW (ref >60)
Glucose, Bld: 119 mg/dL — ABNORMAL HIGH (ref 70–99)
Potassium: 4.5 mmol/L (ref 3.5–5.1)
Sodium: 137 mmol/L (ref 135–145)

## 2023-08-03 LAB — TROPONIN I (HIGH SENSITIVITY)
Troponin I (High Sensitivity): 5 ng/L (ref <18)
Troponin I (High Sensitivity): 6 ng/L (ref <18)

## 2023-08-03 LAB — PROTIME-INR
INR: 1 (ref 0.8–1.2)
Prothrombin Time: 14.1 s (ref 11.4–15.2)

## 2023-08-03 NOTE — ED Notes (Signed)
 Patient is being discharged from the Urgent Care and sent to the Emergency Department via POV . Per Dr. Vonna, patient is in need of higher level of care due to lt arm numbness. Patient is aware and verbalizes understanding of plan of care.  Vitals:   08/03/23 1354  BP: 136/86  Pulse: 81  Resp: 20  Temp: 97.7 F (36.5 C)  SpO2: 96%

## 2023-08-03 NOTE — Discharge Instructions (Addendum)
 Follow-up with your cardiologist.  Give them a call tomorrow to schedule a repeat appointment. Please stay well hydrated. Drink plenty of fluid. Go from sitting to standing in a slow manner.    If you have any kind of signs and symptoms consistent for stroke which is loss of vision, speech difficulty, numbness or tingling or weakness in any kind of extremities, then please come to ED for further evaluation.

## 2023-08-03 NOTE — ED Triage Notes (Signed)
 Pt c/o nausea since this am. States has had lt arm numbness x1hr. States had a dizzy spell yesterday. States hx of MI last year. Denies CP or SOB.

## 2023-08-03 NOTE — ED Notes (Signed)
 Patient felt dizzy yesterday when suddenly getting up to get a slice of pie.

## 2023-08-03 NOTE — ED Triage Notes (Signed)
 Patient to ED by POV with c/o dizziness. Per patient dizziness started yesterday but went away on its own. He was able to do ADLs and ambulate all day yesterday. Today patient states that after his morning shower he felt nervous and nauseated. Voices pain and numbness to L shoulder, denies SOB or respiratory distress. Was seen at [facility] and sent to ED.

## 2023-08-03 NOTE — ED Provider Notes (Signed)
 Patient seen briefly in triage.  Yesterday he had an episode of vertigo that took about an hour to go away.  He has not had any since and was able to eat normally last night.  Today he experienced sensation like he needed to have a bowel movement though he had already had a bowel movement this morning and then he was nauseated.  He is also experiencing some tingling in his left arm.  He is not really having any chest pain or shortness of breath.  No more vertigo today  Past medical history significant for coronary artery disease.  CVs regular rate and rhythm without murmur.  He is alert and oriented and well-appearing in the clinic.  I have asked him to proceed to the emergency room by private vehicle for further evaluation there.  He is agreeable and will go straight there.   Vonna Sharlet POUR, MD 08/03/23 [id number]

## 2023-08-03 NOTE — ED Provider Notes (Signed)
 [place] EMERGENCY DEPARTMENT AT [facility] Provider Note   CSN: [id number] Arrival date & time: 08/03/23  1443     Patient presents with: Dizziness   Robert Estes is a 78 y.o. male.       Dizziness  Patient presents with some dizziness.  Patient states that yesterday afternoon he went from a sitting to stand position.  Subsequently felt lightheaded.  Had to sit back down.  Subsequently this resolved.  He felt like the room was spinning and felt lightheaded.  Patient states once he sat back down this completely resolved.  He states he stood up today and noted some left shoulder tingling.  Patient states that this completely resolved as well.  Has not had any kind of chest pain.  No shortness of breath.  No other numbness or tingling.  No vision changes.  Denies any history of CVA.  States he is on a diuretic right now.  He had an MI in the past but states that none of his symptoms mimic previous MI.  Previous medical history reviewed : Patient was last admitted in April 2024.  Was diagnosed and admitted for NSTEMI.  Underwent PCI with drug-eluting stent x 3 to proximal RCA, mid distal RCA and R-PDA      Prior to Admission medications   Medication Sig Start Date End Date Taking? Authorizing Provider  acetaminophen  (TYLENOL ) 500 MG tablet Take 500 mg by mouth every 6 (six) hours as needed for moderate pain.    [provider]  aspirin  81 MG tablet Take 81 mg by mouth daily.    [provider]  brompheniramine-pseudoephedrine-DM 30-2-10 MG/5ML syrup Take 10 mLs by mouth every 4 (four) hours as needed. 04/09/23   [provider]  diclofenac  (FLECTOR ) 1.3 % PTCH Place 1 patch onto the skin 2 (two) times daily. 06/14/23   Garrick Charleston, MD  eplerenone (INSPRA) 25 MG tablet Take 25 mg by mouth daily. 05/13/22 11/09/22  [provider]  Evolocumab  (REPATHA  SURECLICK) 140 MG/ML SOAJ INJECT 1 PEN INTO THE SKIN EVERY 14 DAYS 12/03/22   Maccia,  Melissa D, RPH-CPP  HYDROcodone -acetaminophen  (NORCO/VICODIN) 5-325 MG tablet Take 1 tablet by mouth every 6 (six) hours as needed for severe pain (pain score 7-10). 06/14/23   Garrick Charleston, MD  latanoprost (XALATAN) 0.005 % ophthalmic solution Place 1 drop into both eyes at bedtime. 11/06/21   [provider]  levocetirizine (XYZAL) 5 MG tablet Take 5 mg by mouth daily. 09/12/21   [provider]  metoprolol  succinate (TOPROL -XL) 50 MG 24 hr tablet Take 1 tablet (50 mg total) by mouth daily. 06/16/22   Hobart Powell BRAVO, MD  montelukast (SINGULAIR) 10 MG tablet Take 10 mg by mouth at bedtime. 01/07/22   [provider]  Multiple Vitamin (MULTIVITAMIN) tablet Take 1 tablet by mouth daily. Patient not taking: Reported on 06/03/2022    [provider]  nitroGLYCERIN  (NITROSTAT ) 0.4 MG SL tablet Place 1 tablet (0.4 mg total) under the tongue every 5 (five) minutes as needed for chest pain. 11/29/21   Claudene Victory ORN, MD  pantoprazole  (PROTONIX ) 40 MG tablet Take 40 mg by mouth daily. 12/22/22   [provider]  polyethylene glycol (MIRALAX / GLYCOLAX) 17 g packet Take 17 g by mouth daily as needed for mild constipation. 04/17/22   [provider]  rosuvastatin  (CRESTOR ) 40 MG tablet Take 40 mg by mouth daily.    [provider]  sodium chloride  (OCEAN) 0.65 %  SOLN nasal spray Place 1 spray into both nostrils as needed for congestion.    [provider]  tamsulosin  (FLOMAX ) 0.4 MG CAPS capsule Take 0.4 mg by mouth daily.    [provider]  testosterone  cypionate (DEPOTESTOSTERONE CYPIONATE) 200 MG/ML injection Inject 200 mg into the muscle every 14 (fourteen) days. 09/12/21   [provider]  TURMERIC PO Take 2 capsules by mouth daily.    [provider]  zolpidem  (AMBIEN ) 10 MG tablet TAKE 1 TABLET(10 MG) BY MOUTH AT BEDTIME AS NEEDED FOR SLEEP 07/21/20   Purcell Emil Schanz, MD    Allergies: Codeine     Review of Systems  Neurological:  Positive for dizziness.    Updated Vital Signs BP 121/73   Pulse 69   Temp 98.3 F (36.8 C) (Oral)   Resp 18   Ht 5' 8 (1.727 m)   Wt 79.4 kg   SpO2 98%   BMI 26.61 kg/m   Physical Exam Vitals and nursing note reviewed.  Constitutional:      General: He is not in acute distress.    Appearance: He is well-developed.  HENT:     Head: Normocephalic and atraumatic.  Eyes:     Conjunctiva/sclera: Conjunctivae normal.  Cardiovascular:     Rate and Rhythm: Normal rate and regular rhythm.     Heart sounds: No murmur heard. Pulmonary:     Effort: Pulmonary effort is normal. No respiratory distress.     Breath sounds: Normal breath sounds.  Abdominal:     Palpations: Abdomen is soft.     Tenderness: There is no abdominal tenderness.  Musculoskeletal:        General: No swelling.     Cervical back: Neck supple.  Skin:    General: Skin is warm and dry.     Capillary Refill: Capillary refill takes less than 2 seconds.  Neurological:     Mental Status: He is alert.  Psychiatric:        Mood and Affect: Mood normal.     (all labs ordered are listed, but only abnormal results are displayed) Labs Reviewed  BASIC METABOLIC PANEL WITH GFR - Abnormal; Notable for the following components:      Result Value   CO2 20 (*)    Glucose, Bld 119 (*)    Creatinine, Ser 1.68 (*)    GFR, Estimated 42 (*)    All other components within normal limits  CBC - Abnormal; Notable for the following components:   RBC 5.98 (*)    Hemoglobin 17.3 (*)    HCT 53.0 (*)    RDW 17.7 (*)    Platelets 145 (*)    All other components within normal limits  PROTIME-INR  TROPONIN I (HIGH SENSITIVITY)  TROPONIN I (HIGH SENSITIVITY)    EKG: None  NSR. No stemi. No changes compared to prior   Radiology: DG Chest 2 View Result Date: 08/03/2023 CLINICAL DATA:  Dizziness EXAM: CHEST - 2 VIEW COMPARISON:  05/09/2022 FINDINGS: No acute airspace disease or pleural  effusion. Status post TAVR. Stable cardiomediastinal silhouette. No pneumothorax IMPRESSION: No active cardiopulmonary disease. Electronically Signed   By: Luke Bun M.D.   On: 08/03/2023 17:10     Procedures   Medications Ordered in the ED - No data to display  Clinical Course as of 08/03/23 1909  Mon Aug 03, 2023  1854       1. Fusion of left and right coronary cusps; moderate to severe AS (  mean gradient 24 mmHg suggestive of moderate AS similar to 03/12/20); however AVA 0.7 cm2 and DI 0.25 suggestive of severe AS.  2. Left ventricular ejection fraction, by estimation, is 60 to 65%. The left ventricle has normal function. The left ventricle has no regional wall motion abnormalities. Left ventricular diastolic parameters are consistent with Grade I diastolic dysfunction (impaired relaxation). Elevated left atrial pressure.  3. Right ventricular systolic function is normal. The right ventricular size is normal.  4. The mitral valve is normal in structure. Mild mitral valve regurgitation. No evidence of mitral stenosis.  5. The aortic valve is normal in structure. Aortic valve regurgitation is mild. Moderate to severe aortic valve stenosis.  6. The inferior vena cava is normal in size with greater than 50% respiratory variability, suggesting right atrial pressure of 3 mmHg.  [TL]    Clinical Course User Index [TL] Simon Lavonia SAILOR, MD                   NIH Stroke Scale: 0              Medical Decision Making Amount and/or Complexity of Data Reviewed Labs: ordered. Radiology: ordered.    Previous medical history reviewed : Patient was last admitted in April 2024.  Was diagnosed and admitted for NSTEMI.  Underwent PCI with drug-eluting stent x 3 to proximal RCA, mid distal RCA and R-PDA  Patient presents with some dizziness.  Patient states that yesterday afternoon he went from a sitting to stand position.  Subsequently felt lightheaded.  Had to sit back down.   Subsequently this resolved.  He felt like the room was spinning and felt lightheaded.  Patient states once he sat back down this completely resolved.  He states he stood up today and noted some left shoulder tingling.  Patient states that this completely resolved as well.  Has not had any kind of chest pain.  No shortness of breath.  No other numbness or tingling.  No vision changes.  Denies any history of CVA.  States he is on a diuretic right now.  He had an MI in the past but states that none of his symptoms mimic previous MI.  Previous medical history reviewed : Patient was last admitted in April 2024.  Was diagnosed and admitted for NSTEMI.  Underwent PCI with drug-eluting stent x 3 to proximal RCA, mid distal RCA and R-PDA  In terms of patient's vertigo episodes, no concerns for CVA at this point time.  Patient alert and oriented x 3 GCS 15.  NIH is 0.  Current nerves II through XII intact.  Romberg negative.  No vertical or rotary nystagmus.  Normal finger-nose.  No concerns for posterior CVA.  No indication for CT imaging.  I do think this is more likely orthostatic.  Patient is on a diuretic.  Recommended going from a sitting to standing position in a slow manner.  He can follow-up with cardiology if symptoms continue to see if he can decrease diuretics.  Patient has a history of TAVR in the setting of aortic stenosis.  Do not think this is contributing.  Patient does have a history of STEMI.  Obtain EKG.  Normal sinus rhythm.  No STEMI.  Obtained troponin x 2 which was unremarkable.  High-sensitivity troponin.  He can follow-up with cardiologist outpatient.  He is not have any and can of his previous episodes of chest pain when he had a STEMI.  Recommend follow-up with cardiologist for this.  Again, no chest pain no shortness of breath.         Final diagnoses:  Near syncope    ED Discharge Orders     None          Simon Lavonia SAILOR, MD 08/03/23 [phone]

## 2024-01-05 ENCOUNTER — Other Ambulatory Visit: Payer: Self-pay | Admitting: Pharmacist

## 2024-01-08 ENCOUNTER — Other Ambulatory Visit: Payer: Self-pay | Admitting: Pharmacist
